# Patient Record
Sex: Female | Born: 1990 | Race: White | Hispanic: No | Marital: Single | Smoking: Current every day smoker
Health system: Southern US, Community
[De-identification: ages and names within clinical notes are randomized; demographics above are authoritative.]

## PROBLEM LIST (undated history)

## (undated) ENCOUNTER — Inpatient Hospital Stay (HOSPITAL_COMMUNITY): Payer: Self-pay

## (undated) DIAGNOSIS — F329 Major depressive disorder, single episode, unspecified: Secondary | ICD-10-CM

## (undated) DIAGNOSIS — N611 Abscess of the breast and nipple: Secondary | ICD-10-CM

## (undated) DIAGNOSIS — J45909 Unspecified asthma, uncomplicated: Secondary | ICD-10-CM

## (undated) DIAGNOSIS — F32A Depression, unspecified: Secondary | ICD-10-CM

## (undated) DIAGNOSIS — S5292XA Unspecified fracture of left forearm, initial encounter for closed fracture: Secondary | ICD-10-CM

## (undated) DIAGNOSIS — S92302A Fracture of unspecified metatarsal bone(s), left foot, initial encounter for closed fracture: Secondary | ICD-10-CM

---
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
## 2009-04-20 ENCOUNTER — Emergency Department (HOSPITAL_COMMUNITY): Admission: EM | Admit: 2009-04-20 | Discharge: 2009-04-20 | Payer: Self-pay | Admitting: Emergency Medicine

## 2009-07-07 ENCOUNTER — Emergency Department (HOSPITAL_COMMUNITY): Admission: EM | Admit: 2009-07-07 | Discharge: 2009-07-07 | Payer: Self-pay | Admitting: Emergency Medicine

## 2009-10-06 ENCOUNTER — Emergency Department (HOSPITAL_COMMUNITY): Admission: EM | Admit: 2009-10-06 | Discharge: 2009-10-06 | Payer: Self-pay | Admitting: Emergency Medicine

## 2009-11-01 ENCOUNTER — Emergency Department (HOSPITAL_COMMUNITY): Admission: EM | Admit: 2009-11-01 | Discharge: 2009-11-01 | Payer: Self-pay | Admitting: Emergency Medicine

## 2010-04-10 ENCOUNTER — Inpatient Hospital Stay (HOSPITAL_COMMUNITY)
Admission: AD | Admit: 2010-04-10 | Discharge: 2010-04-10 | Disposition: A | Discharge status: Home / Self Care | Payer: Self-pay | Source: Ambulatory Visit | Attending: Obstetrics & Gynecology | Admitting: Obstetrics & Gynecology

## 2010-04-10 DIAGNOSIS — R109 Unspecified abdominal pain: Secondary | ICD-10-CM | POA: Insufficient documentation

## 2010-04-10 DIAGNOSIS — N949 Unspecified condition associated with female genital organs and menstrual cycle: Secondary | ICD-10-CM | POA: Insufficient documentation

## 2010-04-10 DIAGNOSIS — N938 Other specified abnormal uterine and vaginal bleeding: Secondary | ICD-10-CM | POA: Insufficient documentation

## 2010-04-10 LAB — URINALYSIS, ROUTINE W REFLEX MICROSCOPIC
Bilirubin Urine: NEGATIVE
Glucose, UA: NEGATIVE mg/dL
Ketones, ur: NEGATIVE mg/dL
Nitrite: NEGATIVE
Protein, ur: NEGATIVE mg/dL
Specific Gravity, Urine: 1.025 (ref 1.005–1.030)
Urobilinogen, UA: 0.2 mg/dL (ref 0.0–1.0)
pH: 6.5 (ref 5.0–8.0)

## 2010-04-10 LAB — DIFFERENTIAL
Basophils Relative: 0 % (ref 0–1)
Lymphs Abs: 2.8 10*3/uL (ref 0.7–4.0)
Monocytes Relative: 9 % (ref 3–12)
Neutro Abs: 4 10*3/uL (ref 1.7–7.7)

## 2010-04-10 LAB — CBC
Hemoglobin: 14.2 g/dL (ref 12.0–15.0)
Platelets: 271 10*3/uL (ref 150–400)
RDW: 13.1 % (ref 11.5–15.5)

## 2010-04-10 LAB — HCG, QUANTITATIVE, PREGNANCY: hCG, Beta Chain, Quant, S: <2 m[IU]/mL (ref <5)

## 2010-04-10 LAB — WET PREP, GENITAL
Clue Cells Wet Prep HPF POC: NONE SEEN
Trich, Wet Prep: NONE SEEN

## 2010-04-11 LAB — GC/CHLAMYDIA PROBE AMP, GENITAL: Chlamydia, DNA Probe: NEGATIVE

## 2010-05-20 ENCOUNTER — Emergency Department (HOSPITAL_COMMUNITY)
Admission: EM | Admit: 2010-05-20 | Discharge: 2010-05-20 | Discharge status: Against Medical Advice | Payer: Self-pay | Attending: Emergency Medicine | Admitting: Emergency Medicine

## 2010-05-20 DIAGNOSIS — G43909 Migraine, unspecified, not intractable, without status migrainosus: Secondary | ICD-10-CM | POA: Insufficient documentation

## 2010-09-08 ENCOUNTER — Emergency Department (HOSPITAL_COMMUNITY)
Admission: EM | Admit: 2010-09-08 | Discharge: 2010-09-08 | Discharge status: Against Medical Advice | Payer: Self-pay | Attending: Emergency Medicine | Admitting: Emergency Medicine

## 2010-09-08 DIAGNOSIS — G43909 Migraine, unspecified, not intractable, without status migrainosus: Secondary | ICD-10-CM | POA: Insufficient documentation

## 2010-09-20 ENCOUNTER — Emergency Department (HOSPITAL_COMMUNITY)
Admission: EM | Admit: 2010-09-20 | Discharge: 2010-09-20 | Disposition: A | Discharge status: Home / Self Care | Payer: Self-pay | Attending: Emergency Medicine | Admitting: Emergency Medicine

## 2010-09-20 DIAGNOSIS — J3489 Other specified disorders of nose and nasal sinuses: Secondary | ICD-10-CM | POA: Insufficient documentation

## 2010-09-20 DIAGNOSIS — R059 Cough, unspecified: Secondary | ICD-10-CM | POA: Insufficient documentation

## 2010-09-20 DIAGNOSIS — R5383 Other fatigue: Secondary | ICD-10-CM | POA: Insufficient documentation

## 2010-09-20 DIAGNOSIS — R070 Pain in throat: Secondary | ICD-10-CM | POA: Insufficient documentation

## 2010-09-20 DIAGNOSIS — R509 Fever, unspecified: Secondary | ICD-10-CM | POA: Insufficient documentation

## 2010-09-20 DIAGNOSIS — J069 Acute upper respiratory infection, unspecified: Secondary | ICD-10-CM | POA: Insufficient documentation

## 2010-09-20 DIAGNOSIS — R05 Cough: Secondary | ICD-10-CM | POA: Insufficient documentation

## 2010-09-20 DIAGNOSIS — R5381 Other malaise: Secondary | ICD-10-CM | POA: Insufficient documentation

## 2010-09-20 DIAGNOSIS — F3130 Bipolar disorder, current episode depressed, mild or moderate severity, unspecified: Secondary | ICD-10-CM | POA: Insufficient documentation

## 2010-09-20 DIAGNOSIS — F988 Other specified behavioral and emotional disorders with onset usually occurring in childhood and adolescence: Secondary | ICD-10-CM | POA: Insufficient documentation

## 2010-10-04 LAB — HCG, QUANTITATIVE, PREGNANCY: hCG, Beta Chain, Quant, S: 18 — ABNORMAL HIGH

## 2010-10-29 LAB — CULTURE, ROUTINE-ABSCESS

## 2011-01-15 DIAGNOSIS — S5292XA Unspecified fracture of left forearm, initial encounter for closed fracture: Secondary | ICD-10-CM

## 2011-01-15 HISTORY — DX: Unspecified fracture of left forearm, initial encounter for closed fracture: S52.92XA

## 2011-06-19 ENCOUNTER — Encounter (HOSPITAL_COMMUNITY): Payer: Self-pay | Admitting: Emergency Medicine

## 2011-06-19 ENCOUNTER — Emergency Department (HOSPITAL_COMMUNITY)
Admission: EM | Admit: 2011-06-19 | Discharge: 2011-06-19 | Disposition: A | Discharge status: Home / Self Care | Payer: Self-pay | Attending: Emergency Medicine | Admitting: Emergency Medicine

## 2011-06-19 DIAGNOSIS — F17200 Nicotine dependence, unspecified, uncomplicated: Secondary | ICD-10-CM | POA: Insufficient documentation

## 2011-06-19 DIAGNOSIS — R1013 Epigastric pain: Secondary | ICD-10-CM | POA: Insufficient documentation

## 2011-06-19 DIAGNOSIS — G43909 Migraine, unspecified, not intractable, without status migrainosus: Secondary | ICD-10-CM

## 2011-06-19 DIAGNOSIS — R110 Nausea: Secondary | ICD-10-CM | POA: Insufficient documentation

## 2011-06-19 DIAGNOSIS — R51 Headache: Secondary | ICD-10-CM | POA: Insufficient documentation

## 2011-06-19 LAB — POCT I-STAT, CHEM 8
Calcium, Ion: 1.19 mmol/L (ref 1.12–1.32)
Chloride: 106 mEq/L (ref 96–112)
Creatinine, Ser: 0.8 mg/dL (ref 0.50–1.10)
Glucose, Bld: 120 mg/dL — ABNORMAL HIGH (ref 70–99)
HCT: 47 % — ABNORMAL HIGH (ref 36.0–46.0)
Potassium: 3.5 mEq/L (ref 3.5–5.1)
Sodium: 143 mEq/L (ref 135–145)
TCO2: 24 mmol/L (ref 0–100)

## 2011-06-19 LAB — DIFFERENTIAL
Lymphocytes Relative: 16 % (ref 12–46)
Lymphs Abs: 2.1 10*3/uL (ref 0.7–4.0)
Monocytes Relative: 8 % (ref 3–12)
Neutro Abs: 9.3 10*3/uL — ABNORMAL HIGH (ref 1.7–7.7)
Neutrophils Relative %: 73 % (ref 43–77)

## 2011-06-19 LAB — CBC
Hemoglobin: 15.1 g/dL — ABNORMAL HIGH (ref 12.0–15.0)
MCH: 31.9 pg (ref 26.0–34.0)
MCHC: 34.3 g/dL (ref 30.0–36.0)
RBC: 4.73 MIL/uL (ref 3.87–5.11)

## 2011-06-19 LAB — URINALYSIS, ROUTINE W REFLEX MICROSCOPIC
Nitrite: NEGATIVE
Protein, ur: NEGATIVE mg/dL
Urobilinogen, UA: 0.2 mg/dL (ref 0.0–1.0)

## 2011-06-19 LAB — POCT PREGNANCY, URINE: Preg Test, Ur: NEGATIVE

## 2011-06-19 LAB — URINE MICROSCOPIC-ADD ON

## 2011-06-19 MED ORDER — ONDANSETRON 4 MG PO TBDP
4.0000 mg | ORAL_TABLET | Freq: Once | ORAL | Status: DC
  Filled 2011-06-19: qty 1

## 2011-06-19 NOTE — ED Notes (Signed)
Pt. Not currently in room or bathroom. Will recheck.

## 2011-06-19 NOTE — ED Notes (Signed)
Patient added also having headaches intermittently 0/10.

## 2011-06-19 NOTE — ED Notes (Signed)
Pt. Reports nausea and migraine X 1 week. States hx of migraines when she was younger.  States the migraines come and go. Denies vomiting. Mid epigastric abdominal pain, tender to palpitation.

## 2011-06-19 NOTE — ED Notes (Signed)
Pt. Not in room, bathroom, or in ED waiting room.

## 2011-06-19 NOTE — ED Notes (Signed)
Onset one week ago nausea, acid reflux, intermittent shaking, and light headed. Concerned if possible pregnancy or virus.  Epigastric pain cramping 4-5/10.

## 2011-06-19 NOTE — ED Provider Notes (Signed)
History   This chart was scribed for Celene Kras, MD by Melba Coon. The patient was seen in room STRE3/STRE3 and the patient's care was started at 5:08PM.    CSN: [id number]  Arrival date & time 06/19/11  1448   None     Chief Complaint  Patient presents with  . Nausea  . Headache    (Consider location/radiation/quality/duration/timing/severity/associated sxs/prior treatment) HPI Mary Davies is a 21 y.o. female who presents to the Emergency Department complaining of intermtitent, moderate to severe headache with associated nausea with an onset 1:00PM.  Pain started a week ago but got progressively worse today. Pt states that she has eben feeling hot, intermittent shaking, and dizzy. Pt also states she has acid reflux and nausea while eating. No fever, neck pain, sore throat, rash, back pain, CP, SOB, abd pain, vomit, diarrhea, dysuria, or extremity pain, edema, weakness, numbness, or tingling. No known allergies. No other pertinent medical symptoms.  History reviewed. No pertinent past medical history.  History reviewed. No pertinent past surgical history.  No family history on file.  History  Substance Use Topics  . Smoking status: Current Everyday Smoker  . Smokeless tobacco: Not on file  . Alcohol Use: No    OB History    Grav Para Term Preterm Abortions TAB SAB Ect Mult Living                  Review of Systems 10 Systems reviewed and all are negative for acute change except as noted in the HPI.   Allergies  Review of patient's allergies indicates no known allergies.  Home Medications   Current Outpatient Rx  Name Route Sig Dispense Refill  . ACETAMINOPHEN 500 MG PO TABS Oral Take 1,000 mg by mouth every 6 (six) hours as needed. For headache      BP 130/80  Pulse 78  Temp(Src) 98.1 F (36.7 C) (Oral)  Resp 16  SpO2 97%  Physical Exam  Nursing note and vitals reviewed. Constitutional: She appears well-developed and well-nourished. No  distress.  HENT:  Head: Normocephalic and atraumatic.  Right Ear: External ear normal.  Left Ear: External ear normal.  Eyes: Conjunctivae are normal. Right eye exhibits no discharge. Left eye exhibits no discharge. No scleral icterus.  Neck: Neck supple. No tracheal deviation present.  Cardiovascular: Normal rate, regular rhythm and intact distal pulses.   Pulmonary/Chest: Effort normal and breath sounds normal. No stridor. No respiratory distress. She has no wheezes. She has no rales.  Abdominal: Soft. Bowel sounds are normal. She exhibits no distension. There is tenderness (mild epigastric). There is no rebound and no guarding.  Musculoskeletal: She exhibits no edema and no tenderness.  Neurological: She is alert. She has normal strength. No sensory deficit. Cranial nerve deficit:  no gross defecits noted. She exhibits normal muscle tone. She displays no seizure activity. Coordination normal.  Skin: Skin is warm and dry. No rash noted.  Psychiatric: She has a normal mood and affect.    ED Course  Procedures (including critical care time)  DIAGNOSTIC STUDIES: Oxygen Saturation is 97% on room air, normal by my interpretation.    COORDINATION OF CARE:  5:10PM - lab results are c/w UTI; more blood tests for gall bladder function and zofran will be ordered for the pt   Labs Reviewed  CBC - Abnormal; Notable for the following:    WBC 12.8 (*)    Hemoglobin 15.1 (*)    All other components within normal limits  DIFFERENTIAL - Abnormal; Notable for the following:    Neutro Abs 9.3 (*)    All other components within normal limits  URINALYSIS, ROUTINE W REFLEX MICROSCOPIC - Abnormal; Notable for the following:    APPearance CLOUDY (*)    Bilirubin Urine SMALL (*)    Leukocytes, UA MODERATE (*)    All other components within normal limits  POCT I-STAT, CHEM 8 - Abnormal; Notable for the following:    Glucose, Bld 120 (*)    Hemoglobin 16.0 (*)    HCT 47.0 (*)    All other  components within normal limits  URINE MICROSCOPIC-ADD ON - Abnormal; Notable for the following:    Squamous Epithelial / LPF MANY (*)    All other components within normal limits  POCT PREGNANCY, URINE  LAB REPORT - SCANNED   No results found.   No diagnosis found.    MDM  Pt treated for probable migraine headache.  Felt better and was going to be released but left prior to receiving instructions.  I personally performed the services described in this documentation, which was scribed in my presence.  The recorded information has been reviewed and considered.        Celene Kras, MD 06/21/11 1331

## 2011-08-26 ENCOUNTER — Emergency Department (HOSPITAL_COMMUNITY)
Admission: EM | Admit: 2011-08-26 | Discharge: 2011-08-26 | Disposition: A | Discharge status: Home / Self Care | Payer: Self-pay | Attending: Emergency Medicine | Admitting: Emergency Medicine

## 2011-08-26 ENCOUNTER — Emergency Department (HOSPITAL_COMMUNITY): Payer: Self-pay

## 2011-08-26 ENCOUNTER — Encounter (HOSPITAL_COMMUNITY): Payer: Self-pay | Admitting: *Deleted

## 2011-08-26 DIAGNOSIS — L0390 Cellulitis, unspecified: Secondary | ICD-10-CM

## 2011-08-26 DIAGNOSIS — F17200 Nicotine dependence, unspecified, uncomplicated: Secondary | ICD-10-CM | POA: Insufficient documentation

## 2011-08-26 DIAGNOSIS — L02519 Cutaneous abscess of unspecified hand: Secondary | ICD-10-CM | POA: Insufficient documentation

## 2011-08-26 DIAGNOSIS — L03019 Cellulitis of unspecified finger: Secondary | ICD-10-CM | POA: Insufficient documentation

## 2011-08-26 MED ORDER — CLINDAMYCIN HCL 150 MG PO CAPS: ORAL_CAPSULE | ORAL | Status: AC

## 2011-08-26 MED ORDER — CLINDAMYCIN HCL 300 MG PO CAPS
300.0000 mg | ORAL_CAPSULE | Freq: Once | ORAL | Status: AC
  Administered 2011-08-26: 300 mg via ORAL
  Filled 2011-08-26: qty 1

## 2011-08-26 NOTE — ED Notes (Signed)
PT ambulated with a steady gait; VSS; A&Ox3; no signs of distress; respirations even and unlabored; skin warm and dry. No questions.

## 2011-08-26 NOTE — ED Notes (Signed)
Pt states she woke up this morning with her left middle finger swollen and bruised. Pt denies injury or known reason.

## 2011-08-26 NOTE — ED Provider Notes (Signed)
Medical screening examination/treatment/procedure(s) were performed by non-physician practitioner and as supervising physician I was immediately available for consultation/collaboration.  Sharrieff Spratlin M Bergen Melle, MD 08/26/11 0643 

## 2011-08-26 NOTE — ED Notes (Signed)
Patient transported to X-ray 

## 2011-08-26 NOTE — ED Provider Notes (Signed)
History     CSN: [id number]  Arrival date & time 08/26/11  0457   First MD Initiated Contact with Patient 08/26/11 0507      Chief Complaint  Patient presents with  . Finger Injury    (Consider location/radiation/quality/duration/timing/severity/associated sxs/prior treatment) HPI Comments: Patient is a current everyday smoker that presents emergency department with chief complaint of left middle finger swelling.  Onset of symptoms began this morning and are associated with swelling and warmth of finger. Associated symptom is mild pain rated at 3/10 in severity that does not radiate. Pt denies any recent trauma or injury, fever, night sweats, chills, erythematous streaks up arm or purulent drainage.  The history is provided by the patient.    History reviewed. No pertinent past medical history.  History reviewed. No pertinent past surgical history.  History reviewed. No pertinent family history.  History  Substance Use Topics  . Smoking status: Current Everyday Smoker  . Smokeless tobacco: Not on file  . Alcohol Use: No    OB History    Grav Para Term Preterm Abortions TAB SAB Ect Mult Living                  Review of Systems  All other systems reviewed and are negative.    Allergies  Review of patient's allergies indicates no known allergies.  Home Medications  No current outpatient prescriptions on file.  BP 142/90  Pulse 68  Temp 98.3 F (36.8 C) (Oral)  Resp 16  SpO2 99%  LMP 07/24/2011  Physical Exam  Nursing note and vitals reviewed. Constitutional: She is oriented to person, place, and time. She appears well-developed and well-nourished. No distress.  HENT:  Head: Normocephalic and atraumatic.  Eyes: Conjunctivae and EOM are normal.  Neck: Normal range of motion.  Pulmonary/Chest: Effort normal.  Musculoskeletal: Normal range of motion.  Neurological: She is alert and oriented to person, place, and time.  Skin: Skin is warm and dry. No rash  noted. She is not diaphoretic.       tiny abrasion over left 3rd finger, dorsal surface, with surrounding erythema and warmth ~2cm. Mild swelling of digit.   Psychiatric: She has a normal mood and affect. Her behavior is normal.    ED Course  Procedures (including critical care time)   Labs Reviewed  POCT PREGNANCY, URINE   No results found.   No diagnosis found.    MDM  Cellulitis  Pt presentation c/w mils cellulitis that can be treated as an OP w close follow up. Strict return precautions discussed.  At this time there does not appear to be any evidence of an acute emergency medical condition and the patient appears stable for discharge with appropriate outpatient follow up.         Jaci Carrel, [place] 08/26/11 [phone]

## 2011-08-26 NOTE — ED Notes (Signed)
Pt's middle finger has bite on it and mild swelling. PT reports she thinks it was a spider.

## 2012-02-07 ENCOUNTER — Emergency Department (HOSPITAL_COMMUNITY)
Admission: EM | Admit: 2012-02-07 | Discharge: 2012-02-07 | Disposition: A | Discharge status: Home / Self Care | Payer: Self-pay | Attending: Emergency Medicine | Admitting: Emergency Medicine

## 2012-02-07 ENCOUNTER — Encounter (HOSPITAL_COMMUNITY): Payer: Self-pay | Admitting: Emergency Medicine

## 2012-02-07 DIAGNOSIS — B86 Scabies: Secondary | ICD-10-CM | POA: Insufficient documentation

## 2012-02-07 DIAGNOSIS — F17200 Nicotine dependence, unspecified, uncomplicated: Secondary | ICD-10-CM | POA: Insufficient documentation

## 2012-02-07 MED ORDER — PERMETHRIN 5 % EX CREA: TOPICAL_CREAM | CUTANEOUS | Status: DC

## 2012-02-07 NOTE — ED Notes (Signed)
Rash on body , has had it for 4 days. Appears to be scabies. Has h/o of scabies

## 2012-02-07 NOTE — ED Provider Notes (Signed)
History     CSN: [id number]  Arrival date & time 02/07/12  1045   First MD Initiated Contact with Patient 02/07/12 1102      Chief Complaint  Patient presents with  . Rash    (Consider location/radiation/quality/duration/timing/severity/associated sxs/prior treatment) Patient is a 22 y.o. female presenting with rash. The history is provided by the patient. No language interpreter was used.  Rash  This is a new problem. The problem has been gradually worsening. The problem is associated with nothing. There has been no fever. The rash is present on the abdomen, back and trunk. The pain is at a severity of 5/10. The pain is moderate. The pain has been worsening since onset. Associated symptoms include itching. She has tried nothing for the symptoms.   Pt complains of a rash.  Pt reports feels like scabies she has had in the past Past Medical History  Diagnosis Date  . Scabies     History reviewed. No pertinent past surgical history.  No family history on file.  History  Substance Use Topics  . Smoking status: Current Every Day Smoker  . Smokeless tobacco: Not on file  . Alcohol Use: No    OB History    Grav Para Term Preterm Abortions TAB SAB Ect Mult Living                  Review of Systems  Skin: Positive for itching and rash.  All other systems reviewed and are negative.    Allergies  Review of patient's allergies indicates no known allergies.  Home Medications  No current outpatient prescriptions on file.  BP 136/84  Pulse 75  Temp 97.4 F (36.3 C) (Oral)  Resp 16  SpO2 98%  LMP 01/14/2012  Physical Exam  Nursing note and vitals reviewed. Constitutional: She is oriented to person, place, and time. She appears well-developed and well-nourished.  HENT:  Head: Normocephalic.  Right Ear: External ear normal.  Left Ear: External ear normal.  Nose: Nose normal.  Mouth/Throat: Oropharynx is clear and moist.  Eyes: Conjunctivae normal are normal.  Pupils are equal, round, and reactive to light.  Neck: Normal range of motion. Neck supple.  Cardiovascular: Normal rate and normal heart sounds.   Pulmonary/Chest: Effort normal.  Musculoskeletal: Normal range of motion.  Neurological: She is alert and oriented to person, place, and time. She has normal reflexes.  Skin: Skin is warm.  Psychiatric: She has a normal mood and affect.    ED Course  Procedures (including critical care time)  Labs Reviewed - No data to display No results found.   No diagnosis found.    MDM    elemite     Lonia Skinner [place], [place] 02/07/12 [phone]

## 2012-02-07 NOTE — ED Provider Notes (Signed)
Medical screening examination/treatment/procedure(s) were performed by non-physician practitioner and as supervising physician I was immediately available for consultation/collaboration.   Gilda Crease, MD 02/07/12 [phone]

## 2012-07-03 ENCOUNTER — Encounter (HOSPITAL_COMMUNITY): Payer: Self-pay | Admitting: Emergency Medicine

## 2012-07-03 ENCOUNTER — Emergency Department (HOSPITAL_COMMUNITY)
Admission: EM | Admit: 2012-07-03 | Discharge: 2012-07-03 | Disposition: A | Discharge status: Home / Self Care | Payer: Self-pay | Attending: Emergency Medicine | Admitting: Emergency Medicine

## 2012-07-03 DIAGNOSIS — T24211A Burn of second degree of right thigh, initial encounter: Secondary | ICD-10-CM

## 2012-07-03 DIAGNOSIS — T2124XA Burn of second degree of lower back, initial encounter: Secondary | ICD-10-CM | POA: Insufficient documentation

## 2012-07-03 DIAGNOSIS — Z8619 Personal history of other infectious and parasitic diseases: Secondary | ICD-10-CM | POA: Insufficient documentation

## 2012-07-03 DIAGNOSIS — Y9289 Other specified places as the place of occurrence of the external cause: Secondary | ICD-10-CM | POA: Insufficient documentation

## 2012-07-03 DIAGNOSIS — Y9389 Activity, other specified: Secondary | ICD-10-CM | POA: Insufficient documentation

## 2012-07-03 DIAGNOSIS — Z23 Encounter for immunization: Secondary | ICD-10-CM | POA: Insufficient documentation

## 2012-07-03 DIAGNOSIS — X19XXXA Contact with other heat and hot substances, initial encounter: Secondary | ICD-10-CM | POA: Insufficient documentation

## 2012-07-03 DIAGNOSIS — F17200 Nicotine dependence, unspecified, uncomplicated: Secondary | ICD-10-CM | POA: Insufficient documentation

## 2012-07-03 DIAGNOSIS — T24219A Burn of second degree of unspecified thigh, initial encounter: Secondary | ICD-10-CM | POA: Insufficient documentation

## 2012-07-03 MED ORDER — TETANUS-DIPHTH-ACELL PERTUSSIS 5-2.5-18.5 LF-MCG/0.5 IM SUSP
0.5000 mL | Freq: Once | INTRAMUSCULAR | Status: AC
  Administered 2012-07-03: 0.5 mL via INTRAMUSCULAR
  Filled 2012-07-03: qty 0.5

## 2012-07-03 NOTE — ED Notes (Signed)
Pharmacy tech at bedside 

## 2012-07-03 NOTE — ED Provider Notes (Signed)
History  This chart was scribed for Mary Cooper III, MD by Ardelia Mems, ED Scribe. This patient was seen in room TR08C/TR08C and the patient's care was started at 3:09 PM.   CSN: [id number]  Arrival date & time 07/03/12  1423      Chief Complaint  Patient presents with  . Burn     The history is provided by the patient. No language interpreter was used.    HPI Comments: Mary Davies is a 22 y.o. female who presents to the Emergency Department complaining of a burn to her right buttock onset about a week ago after accidentally sitting on her hair straightener. Pt is here today under suspicion that her wound is not healing appropriately. Pt states that there is associated constant, moderate pain to the area that is worsened with palpation or any movement of the leg. Pt states that she has tried cleaning the burn with alcohol and neosporin without relief of pain. Pt is here solely for the burn without any associated symptoms. Pt denies nausea, vomiting, fever, or any other symptoms  PCP- None  Past Medical History  Diagnosis Date  . Scabies     History reviewed. No pertinent past surgical history.  No family history on file.  History  Substance Use Topics  . Smoking status: Current Every Day Smoker  . Smokeless tobacco: Not on file  . Alcohol Use: No    OB History   Grav Para Term Preterm Abortions TAB SAB Ect Mult Living                  Review of Systems  Constitutional: Negative for fever and diaphoresis.  HENT: Negative for neck pain and neck stiffness.   Eyes: Negative for visual disturbance.  Respiratory: Negative for apnea, chest tightness and shortness of breath.   Cardiovascular: Negative for chest pain and palpitations.  Gastrointestinal: Negative for nausea, vomiting, diarrhea and constipation.  Genitourinary: Negative for dysuria.  Musculoskeletal: Negative for gait problem.  Skin: Positive for wound (Healing burn, right buttocks). Negative for  rash.  Neurological: Negative for dizziness, weakness, light-headedness, numbness and headaches.   A complete 10 system review of systems was obtained and all systems are negative except as noted in the HPI and PMH.   Allergies  Pineapple  Home Medications  No current outpatient prescriptions on file.  Triage Vitals: BP 136/81  Pulse 77  Temp(Src) 98.6 F (37 C) (Oral)  Resp 18  SpO2 99%  LMP 06/19/2012  Physical Exam  Nursing note and vitals reviewed. Constitutional: She is oriented to person, place, and time. She appears well-developed and well-nourished. No distress.  HENT:  Head: Normocephalic and atraumatic.  Eyes: Conjunctivae and EOM are normal.  Neck: Normal range of motion. Neck supple.  Cardiovascular: Normal rate, regular rhythm, normal heart sounds and intact distal pulses.   Pulmonary/Chest: Effort normal and breath sounds normal. No respiratory distress.  Abdominal: Soft. There is no tenderness.  Musculoskeletal: Normal range of motion. She exhibits no edema and no tenderness.  Neurological: She is alert and oriented to person, place, and time. No cranial nerve deficit.  Skin: She is not diaphoretic.  Healing 2nd degree burns 1% BSA noted to right buttocks.Erythema. No swelling. No blistering. Non blanching. Scabbing. Damage does not extend beyond the dermis.  6 cm long, triangular shaped burn- 3 cm wide at the top to 1 cm wide at the bottom.  Some redness at edges, no streaking, no blisters Advanced stage of healing  Psychiatric: She has a normal mood and affect.    ED Course  Procedures (including critical care time)  DIAGNOSTIC STUDIES: Oxygen Saturation is 99% on RA, normal by my interpretation.    COORDINATION OF CARE: 3:47 PM- Pt advised of plan for treatment and pt agrees.  3:50 PM- Pt informed that her burn is healing appropriately, and advised on how to manage the wound appropriately for proper, continued healing with bacitracin, gauze,  etc.   Medications  TDaP (BOOSTRIX) injection 0.5 mL (0.5 mLs Intramuscular Given 07/03/12 1507)      Labs Reviewed - No data to display No results found.   1. Second degree burn of right thigh, initial encounter       MDM  .Burn is over a week old. Pt has been keeping it clean. Concerned because it continues to cause her pain. On PE, burn is healing well. No signs of infection. No warmth, no pus, no streaks. Pt able to ambulate well.  At this time there does not appear to be any evidence of an acute emergency medical condition and the patient appears stable for discharge with supportive care appropriate outpatient follow up.Provide resource guide for primary care. Diagnosis was discussed with patient who verbalizes understanding and is agreeable to discharge.  I personally performed the services described in this documentation, which was scribed in my presence. The recorded information has been reviewed and is accurate.    Glade Nurse, PA-C 07/03/12 1828

## 2012-07-03 NOTE — ED Notes (Signed)
Pt. Stated, i sat on my hair straightner a week ago and i feel like the burn has gotten woprse.

## 2012-07-03 NOTE — ED Notes (Addendum)
Pt sat on a hot hair iron 1 week ago. Has 4 in x 1in burn on right posterior upper thigh.

## 2012-07-04 NOTE — ED Provider Notes (Signed)
Medical screening examination/treatment/procedure(s) were performed by non-physician practitioner and as supervising physician I was immediately available for consultation/collaboration.   Carleene Cooper III, MD 07/04/12 [phone]

## 2012-07-10 ENCOUNTER — Encounter (HOSPITAL_COMMUNITY): Payer: Self-pay

## 2012-07-10 ENCOUNTER — Emergency Department (HOSPITAL_COMMUNITY)
Admission: EM | Admit: 2012-07-10 | Discharge: 2012-07-10 | Disposition: A | Discharge status: Home / Self Care | Payer: Self-pay | Attending: Emergency Medicine | Admitting: Emergency Medicine

## 2012-07-10 DIAGNOSIS — Y929 Unspecified place or not applicable: Secondary | ICD-10-CM | POA: Insufficient documentation

## 2012-07-10 DIAGNOSIS — Y939 Activity, unspecified: Secondary | ICD-10-CM | POA: Insufficient documentation

## 2012-07-10 DIAGNOSIS — L03119 Cellulitis of unspecified part of limb: Secondary | ICD-10-CM | POA: Insufficient documentation

## 2012-07-10 DIAGNOSIS — F17200 Nicotine dependence, unspecified, uncomplicated: Secondary | ICD-10-CM | POA: Insufficient documentation

## 2012-07-10 DIAGNOSIS — R21 Rash and other nonspecific skin eruption: Secondary | ICD-10-CM | POA: Insufficient documentation

## 2012-07-10 DIAGNOSIS — Z8619 Personal history of other infectious and parasitic diseases: Secondary | ICD-10-CM | POA: Insufficient documentation

## 2012-07-10 DIAGNOSIS — L02419 Cutaneous abscess of limb, unspecified: Secondary | ICD-10-CM | POA: Insufficient documentation

## 2012-07-10 DIAGNOSIS — L03116 Cellulitis of left lower limb: Secondary | ICD-10-CM

## 2012-07-10 MED ORDER — CEPHALEXIN 500 MG PO CAPS: 500.0000 mg | ORAL_CAPSULE | Freq: Four times a day (QID) | ORAL | Status: DC

## 2012-07-10 MED ORDER — HYDROCODONE-ACETAMINOPHEN 5-325 MG PO TABS: 1.0000 | ORAL_TABLET | ORAL | Status: DC | PRN

## 2012-07-10 NOTE — ED Provider Notes (Signed)
   History    This chart was scribed for non-physician practitioner, Fayrene Helper PA-C, working with Gwyneth Sprout, MD by Donne Anon, ED Scribe. This patient was seen in room TR04C/TR04C and the patient's care was started at 1508.  CSN: [id number] Arrival date & time 07/10/12  1418  First MD Initiated Contact with Patient 07/10/12 1508     Chief Complaint  Patient presents with  . Insect Bite    The history is provided by the patient. No language interpreter was used.  HPI Comments: Mary Davies is a 22 y.o. female who presents to the Emergency Department complaining of an insect bite to her left thigh and left calf that occurred 3 days ago. She reports she scratched the bites, and the bite on her thigh became significantly more red and warm. She denies itching. She states she did not see an ant bite her, but she had stepped in an ant hill earlier in the day and had several amps crawling on her. She denies any other pain at this time.  Past Medical History  Diagnosis Date  . Scabies    History reviewed. No pertinent past surgical history. History reviewed. No pertinent family history. History  Substance Use Topics  . Smoking status: Current Every Day Smoker  . Smokeless tobacco: Not on file  . Alcohol Use: No   OB History   Grav Para Term Preterm Abortions TAB SAB Ect Mult Living                 Review of Systems  Skin: Positive for rash.  All other systems reviewed and are negative.    Allergies  Pineapple  Home Medications  No current outpatient prescriptions on file. BP 138/90  Pulse 78  Temp(Src) 97.8 F (36.6 C) (Oral)  Resp 18  SpO2 95%  LMP 06/17/2012 Physical Exam  Nursing note and vitals reviewed. Constitutional: She appears well-developed and well-nourished. No distress.  HENT:  Head: Normocephalic and atraumatic.  Eyes: Conjunctivae are normal.  Neck: Neck supple. No tracheal deviation present.  Cardiovascular: Normal rate.    Pulmonary/Chest: Effort normal. No respiratory distress.  Musculoskeletal: Normal range of motion.  Neurological: She is alert.  Skin: Skin is warm and dry.  Left upper thigh with a cellulitic area measuring 3x5 inches with an accentuated puncture and small scab. Mild induration without significant fluctuance. Tender to palpation.  Psychiatric: She has a normal mood and affect. Her behavior is normal.    ED Course  Procedures (including critical care time) DIAGNOSTIC STUDIES: Oxygen Saturation is 95% on RA, adequate by my interpretation.    COORDINATION OF CARE: 3:49 PM Discussed treatment plan which includes antibiotics, pain medication, and warm compreses with pt at bedside and pt agreed to plan. I will also outline the area and obtain a picture. Return precautions advised.     Labs Reviewed - No data to display No results found. 1. Cellulitis of left thigh     MDM  BP 138/90  Pulse 78  Temp(Src) 97.8 F (36.6 C) (Oral)  Resp 18  SpO2 95%  LMP 06/17/2012   I personally performed the services described in this documentation, which was scribed in my presence. The recorded information has been reviewed and is accurate.      Fayrene Helper, PA-C 07/10/12 1554

## 2012-07-10 NOTE — ED Notes (Signed)
Pt presents with 3 day h/o insect bites to L leg.  Pt reports she noted "a lot of ants" on her leg, but unsure if they bit her.  Pt reports areas are red with white middle, denies any drainage to areas.

## 2012-07-10 NOTE — ED Notes (Signed)
Pt c/o left sided upper leg pain, sts she thinks she was bitten by an ant 3 days ago and it just keeps getting worse. Pt has redness to left upper thigh, with center spot that has a white color to it. Reports redness has grown over the past couple of days. Tender to touch. Pt in nad, skin warm and dry, resp e/u, ambulatory to room.

## 2012-07-11 ENCOUNTER — Emergency Department (HOSPITAL_COMMUNITY)
Admission: EM | Admit: 2012-07-11 | Discharge: 2012-07-11 | Disposition: A | Discharge status: Home / Self Care | Payer: Self-pay | Attending: Emergency Medicine | Admitting: Emergency Medicine

## 2012-07-11 ENCOUNTER — Encounter (HOSPITAL_COMMUNITY): Payer: Self-pay | Admitting: Emergency Medicine

## 2012-07-11 DIAGNOSIS — R21 Rash and other nonspecific skin eruption: Secondary | ICD-10-CM | POA: Insufficient documentation

## 2012-07-11 DIAGNOSIS — Y939 Activity, unspecified: Secondary | ICD-10-CM | POA: Insufficient documentation

## 2012-07-11 DIAGNOSIS — L03119 Cellulitis of unspecified part of limb: Secondary | ICD-10-CM | POA: Insufficient documentation

## 2012-07-11 DIAGNOSIS — L0390 Cellulitis, unspecified: Secondary | ICD-10-CM

## 2012-07-11 DIAGNOSIS — F17200 Nicotine dependence, unspecified, uncomplicated: Secondary | ICD-10-CM | POA: Insufficient documentation

## 2012-07-11 DIAGNOSIS — L02419 Cutaneous abscess of limb, unspecified: Secondary | ICD-10-CM | POA: Insufficient documentation

## 2012-07-11 DIAGNOSIS — Z872 Personal history of diseases of the skin and subcutaneous tissue: Secondary | ICD-10-CM | POA: Insufficient documentation

## 2012-07-11 DIAGNOSIS — Z79899 Other long term (current) drug therapy: Secondary | ICD-10-CM | POA: Insufficient documentation

## 2012-07-11 DIAGNOSIS — Y929 Unspecified place or not applicable: Secondary | ICD-10-CM | POA: Insufficient documentation

## 2012-07-11 MED ORDER — SULFAMETHOXAZOLE-TRIMETHOPRIM 800-160 MG PO TABS: 1.0000 | ORAL_TABLET | Freq: Two times a day (BID) | ORAL | Status: DC

## 2012-07-11 NOTE — ED Notes (Signed)
Pt states she was here yesterday due to insect bite. Told to follow up for recheck in 2 days. Came in today because she is in pain, and has to work next 2 days. Redness noted outside area marked by skin marker. She has taken 2 doses of her medication.

## 2012-07-11 NOTE — ED Provider Notes (Signed)
Medical screening examination/treatment/procedure(s) were performed by non-physician practitioner and as supervising physician I was immediately available for consultation/collaboration.   Margreat Widener W Vandy Fong, MD 07/11/12 2308 

## 2012-07-11 NOTE — ED Notes (Signed)
Pt was just seen here yesterday for same thing. Pt sts that the redness has spread a little past the line drawn yesterday and wasn't able to get her pain prescription filled because it was too expensive, and the pain isn't getting better. Denies taking any OTC pain meds PTA. sts she has taken her antibiotic this morning. Pt in nad, skin warm and dry, resp e/u, ambulatory to room.

## 2012-07-11 NOTE — ED Notes (Signed)
Went to d/c pt, pt and family member gone, not outside room or in near distant area.

## 2012-07-11 NOTE — ED Provider Notes (Signed)
Medical screening examination/treatment/procedure(s) were performed by non-physician practitioner and as supervising physician I was immediately available for consultation/collaboration.   Gwyneth Sprout, MD 07/11/12 2312

## 2012-07-11 NOTE — ED Provider Notes (Signed)
History    CSN: [id number] Arrival date & time 07/11/12  2017  First MD Initiated Contact with Patient 07/11/12 2053     Chief Complaint  Patient presents with  . Insect Bite   (Consider location/radiation/quality/duration/timing/severity/associated sxs/prior Treatment) HPI  Lasheena Frieze is a 22 y.o. female presenting for cellulitis recheck. Patient was seen yesterday, given prescription for Keflex 4 times a day. Patient has taken only 2 doses because she said that she is busy. When queried as to whether she has read the instructions on how to take the medication she says that she has not. Patient denies fever, nausea vomiting, streaks. She endorses pain, 10 out of 10. She was given a prescription for Vicodin which she did not fill.  Past Medical History  Diagnosis Date  . Scabies    History reviewed. No pertinent past surgical history. No family history on file. History  Substance Use Topics  . Smoking status: Current Every Day Smoker  . Smokeless tobacco: Not on file  . Alcohol Use: No   OB History   Grav Para Term Preterm Abortions TAB SAB Ect Mult Living                 Review of Systems  Constitutional: Negative for fever.  Respiratory: Negative for shortness of breath.   Cardiovascular: Negative for chest pain.  Gastrointestinal: Negative for nausea, vomiting, abdominal pain and diarrhea.  Skin: Positive for rash.  All other systems reviewed and are negative.    Allergies  Pineapple  Home Medications   Current Outpatient Rx  Name  Route  Sig  Dispense  Refill  . cephALEXin (KEFLEX) 500 MG capsule   Oral   Take 500 mg by mouth 4 (four) times daily.          BP 120/76  Pulse 90  Temp(Src) 98.1 F (36.7 C) (Oral)  Resp 18  SpO2 96%  LMP 06/17/2012 Physical Exam  Nursing note and vitals reviewed. Constitutional: She is oriented to person, place, and time. She appears well-developed and well-nourished. No distress.  HENT:  Head:  Normocephalic.  Eyes: Conjunctivae and EOM are normal.  Cardiovascular: Normal rate.   Pulmonary/Chest: Effort normal. No stridor.  Musculoskeletal: Normal range of motion.  Neurological: She is alert and oriented to person, place, and time.  Skin:  4 x 5 area of induration, no pointing or fluctuance. No lymphangitic streaks.  Psychiatric: She has a normal mood and affect.      ED Course  Procedures (including critical care time) Labs Reviewed - No data to display No results found.  1. Cellulitis     MDM   Filed Vitals:   07/11/12 2025  BP: 120/76  Pulse: 90  Temp: 98.1 F (36.7 C)  TempSrc: Oral  Resp: 18  SpO2: 96%     Juley Giovanetti is a 21 y.o. female sent in for cellulitis recheck. Patient has presented early for her recheck because of pain. She has failed to fill her prescription for pain medication. Is taking OTC pain medications either. She is taking her Keflex inappropriately, twice a day instead of 4 times a day. I have discussed with her the importance of taking her medications as prescribed. She has verbalized her understanding. We have discussed return precautions including fever, nausea vomiting, palpitations, rapid breathing. I've asked her to return to the emergency room in 48 hours for recheck. Patient has a history of colonization with MRSA. I will add on bactrim to her keflex.  Pt is hemodynamically stable, appropriate for, and amenable to discharge at this time. Pt verbalized understanding and agrees with care plan. Outpatient follow-up and specific return precautions discussed.    Patient eloped before discharge paperwork and prescriptions could be given.   Wynetta Emery, PA-C 07/11/12 2151  Wynetta Emery, PA-C 07/11/12 2157  Wynetta Emery, PA-C 07/11/12 2159

## 2012-07-13 ENCOUNTER — Emergency Department (HOSPITAL_COMMUNITY): Payer: Self-pay

## 2012-07-13 ENCOUNTER — Encounter (HOSPITAL_COMMUNITY): Payer: Self-pay | Admitting: Emergency Medicine

## 2012-07-13 ENCOUNTER — Inpatient Hospital Stay (HOSPITAL_COMMUNITY)
Admission: EM | Admit: 2012-07-13 | Discharge: 2012-07-14 | DRG: 603 | Disposition: A | Discharge status: Home / Self Care | Payer: Self-pay | Attending: Internal Medicine | Admitting: Internal Medicine

## 2012-07-13 DIAGNOSIS — L0390 Cellulitis, unspecified: Secondary | ICD-10-CM

## 2012-07-13 DIAGNOSIS — L02419 Cutaneous abscess of limb, unspecified: Principal | ICD-10-CM | POA: Diagnosis present

## 2012-07-13 DIAGNOSIS — F17200 Nicotine dependence, unspecified, uncomplicated: Secondary | ICD-10-CM | POA: Diagnosis present

## 2012-07-13 DIAGNOSIS — L03119 Cellulitis of unspecified part of limb: Principal | ICD-10-CM | POA: Diagnosis present

## 2012-07-13 DIAGNOSIS — Z720 Tobacco use: Secondary | ICD-10-CM

## 2012-07-13 LAB — CBC WITH DIFFERENTIAL/PLATELET
Basophils Absolute: 0 10*3/uL (ref 0.0–0.1)
Basophils Relative: 0 % (ref 0–1)
Eosinophils Absolute: 0.4 10*3/uL (ref 0.0–0.7)
MCH: 31.1 pg (ref 26.0–34.0)
MCHC: 33.8 g/dL (ref 30.0–36.0)
Monocytes Relative: 10 % (ref 3–12)
Neutrophils Relative %: 59 % (ref 43–77)
Platelets: 235 10*3/uL (ref 150–400)
RDW: 13.7 % (ref 11.5–15.5)

## 2012-07-13 LAB — BASIC METABOLIC PANEL
BUN: 7 mg/dL (ref 6–23)
GFR calc Af Amer: >90 mL/min (ref >90)
GFR calc non Af Amer: >90 mL/min (ref >90)
Potassium: 3.7 mEq/L (ref 3.5–5.1)

## 2012-07-13 LAB — POCT PREGNANCY, URINE: Preg Test, Ur: NEGATIVE

## 2012-07-13 MED ORDER — SODIUM CHLORIDE 0.9 % IV SOLN
INTRAVENOUS | Status: AC
  Administered 2012-07-13: 23:00:00 via INTRAVENOUS

## 2012-07-13 MED ORDER — VANCOMYCIN HCL IN DEXTROSE 1-5 GM/200ML-% IV SOLN
1000.0000 mg | Freq: Once | INTRAVENOUS | Status: AC
  Administered 2012-07-13: 1000 mg via INTRAVENOUS
  Filled 2012-07-13: qty 200

## 2012-07-13 MED ORDER — SODIUM CHLORIDE 0.9 % IV SOLN
INTRAVENOUS | Status: DC
  Administered 2012-07-13: 19:00:00 via INTRAVENOUS

## 2012-07-13 MED ORDER — IOHEXOL 300 MG/ML  SOLN
80.0000 mL | Freq: Once | INTRAMUSCULAR | Status: AC | PRN
  Administered 2012-07-13: 100 mL via INTRAVENOUS

## 2012-07-13 NOTE — Progress Notes (Signed)
cellulitus to left thigh measuring 12cm in redness x 5cm. Abscess measuring 1cm and wheeping. Burn to right buttock measuring 5cm x2cm.

## 2012-07-13 NOTE — ED Notes (Signed)
MD at bedside. 

## 2012-07-13 NOTE — Progress Notes (Signed)
Paged md to make aware that pt has arrived to floor and to receive admission orders.

## 2012-07-13 NOTE — ED Notes (Signed)
Patient back from CT.

## 2012-07-13 NOTE — ED Notes (Signed)
Patient requesting something to eat & drink. Ok per Dr. Freida Busman. Patient given [place] sandwich bag and sprite

## 2012-07-13 NOTE — ED Provider Notes (Signed)
History    CSN: [id number] Arrival date & time 07/13/12  1148  First MD Initiated Contact with Patient 07/13/12 1804     Chief Complaint  Patient presents with  . Abscess   (Consider location/radiation/quality/duration/timing/severity/associated sxs/prior Treatment) Patient is a 22 y.o. female presenting with abscess. The history is provided by the patient.  Abscess  patient here complaining of worsening swelling and pain to her left anterior thigh that started 3 days ago. Seen here x2 for same and given antibiotics. She had the wound edges marked and they have been increasing. Denies any fever or chills. Notes compliance with her Keflex. No drainage from the wound. She thinks it started from an prior insect bite. No prior history of same. Symptoms have been worsening and nothing makes it better Past Medical History  Diagnosis Date  . Scabies    No past surgical history on file. No family history on file. History  Substance Use Topics  . Smoking status: Current Every Day Smoker  . Smokeless tobacco: Not on file  . Alcohol Use: No   OB History   Grav Para Term Preterm Abortions TAB SAB Ect Mult Living                 Review of Systems  All other systems reviewed and are negative.    Allergies  Pineapple  Home Medications   Current Outpatient Rx  Name  Route  Sig  Dispense  Refill  . cephALEXin (KEFLEX) 500 MG capsule   Oral   Take 500 mg by mouth 4 (four) times daily. For 7 days; Start date 07/10/12         . HYDROcodone-acetaminophen (NORCO/VICODIN) 5-325 MG per tablet   Oral   Take 1 tablet by mouth every 6 (six) hours as needed for pain.          BP 111/71  Pulse 70  Temp(Src) 98.7 F (37.1 C) (Oral)  Resp 18  SpO2 98%  LMP 07/12/2012 Physical Exam  Nursing note and vitals reviewed. Constitutional: She is oriented to person, place, and time. She appears well-developed and well-nourished.  Non-toxic appearance. No distress.  HENT:  Head:  Normocephalic and atraumatic.  Eyes: Conjunctivae, EOM and lids are normal. Pupils are equal, round, and reactive to light.  Neck: Normal range of motion. Neck supple. No tracheal deviation present. No mass present.  Cardiovascular: Normal rate, regular rhythm and normal heart sounds.  Exam reveals no gallop.   No murmur heard. Pulmonary/Chest: Effort normal and breath sounds normal. No stridor. No respiratory distress. She has no decreased breath sounds. She has no wheezes. She has no rhonchi. She has no rales.  Abdominal: Soft. Normal appearance and bowel sounds are normal. She exhibits no distension. There is no tenderness. There is no rebound and no CVA tenderness.  Musculoskeletal: Normal range of motion. She exhibits no edema and no tenderness.       Legs: Neurological: She is alert and oriented to person, place, and time. She has normal strength. No cranial nerve deficit or sensory deficit. GCS eye subscore is 4. GCS verbal subscore is 5. GCS motor subscore is 6.  Skin: Skin is warm and dry. No abrasion and no rash noted.  Psychiatric: She has a normal mood and affect. Her speech is normal and behavior is normal.    ED Course  Procedures (including critical care time) Labs Reviewed  CBC WITH DIFFERENTIAL  BASIC METABOLIC PANEL   No results found. No diagnosis found.  MDM  Patient started on vancomycin and will be admitted to medicine  Toy Baker, MD 07/13/12 2045

## 2012-07-13 NOTE — ED Notes (Signed)
Pt seen on Friday for what the pt thought was an ant bite, pt states she noticed the abscess on Thursday. Pt states it increased in size and in redness overnight and she sought medical tx Friday. Pt sent home on antibiotics and dx with cellulitis. Pt states she was informed to come back if the swelling got worse.

## 2012-07-13 NOTE — ED Notes (Signed)
Area of induration marked and dated with black ink by RN

## 2012-07-13 NOTE — ED Notes (Signed)
Been taking oral antibiotics and pain meds as she should; reports blackening around sight; redness worsening. Afebrile now but reports chills last night.

## 2012-07-13 NOTE — ED Notes (Signed)
Patient transported to CT 

## 2012-07-13 NOTE — Progress Notes (Signed)
Admission md has been called. Pt arrived from ed via stretcher. Pt was put in her bed, oriented to her room, iv fluids set up, and pt in bed comfortably.

## 2012-07-14 ENCOUNTER — Encounter (HOSPITAL_COMMUNITY): Payer: Self-pay | Admitting: Internal Medicine

## 2012-07-14 DIAGNOSIS — Z720 Tobacco use: Secondary | ICD-10-CM

## 2012-07-14 DIAGNOSIS — F17200 Nicotine dependence, unspecified, uncomplicated: Secondary | ICD-10-CM

## 2012-07-14 DIAGNOSIS — Z87891 Personal history of nicotine dependence: Secondary | ICD-10-CM

## 2012-07-14 DIAGNOSIS — L0390 Cellulitis, unspecified: Secondary | ICD-10-CM

## 2012-07-14 LAB — CBC
MCH: 31 pg (ref 26.0–34.0)
MCHC: 33.3 g/dL (ref 30.0–36.0)
MCV: 92.9 fL (ref 78.0–100.0)
Platelets: 252 10*3/uL (ref 150–400)
RBC: 4.07 MIL/uL (ref 3.87–5.11)
RDW: 13.8 % (ref 11.5–15.5)

## 2012-07-14 LAB — BASIC METABOLIC PANEL
BUN: 12 mg/dL (ref 6–23)
CO2: 22 mEq/L (ref 19–32)
Calcium: 8.2 mg/dL — ABNORMAL LOW (ref 8.4–10.5)
Creatinine, Ser: 0.81 mg/dL (ref 0.50–1.10)
Glucose, Bld: 117 mg/dL — ABNORMAL HIGH (ref 70–99)

## 2012-07-14 MED ORDER — SODIUM CHLORIDE 0.9 % IV SOLN: INTRAVENOUS | Status: DC

## 2012-07-14 MED ORDER — VANCOMYCIN HCL 10 G IV SOLR
1250.0000 mg | Freq: Three times a day (TID) | INTRAVENOUS | Status: DC
  Administered 2012-07-14 (×2): 1250 mg via INTRAVENOUS
  Filled 2012-07-14 (×3): qty 1250

## 2012-07-14 MED ORDER — ONDANSETRON HCL 4 MG/2ML IJ SOLN: 4.0000 mg | Freq: Four times a day (QID) | INTRAMUSCULAR | Status: DC | PRN

## 2012-07-14 MED ORDER — ACETAMINOPHEN 650 MG RE SUPP: 650.0000 mg | Freq: Four times a day (QID) | RECTAL | Status: DC | PRN

## 2012-07-14 MED ORDER — ONDANSETRON HCL 4 MG PO TABS: 4.0000 mg | ORAL_TABLET | Freq: Four times a day (QID) | ORAL | Status: DC | PRN

## 2012-07-14 MED ORDER — ACETAMINOPHEN 325 MG PO TABS: 650.0000 mg | ORAL_TABLET | Freq: Four times a day (QID) | ORAL | Status: DC | PRN

## 2012-07-14 MED ORDER — SULFAMETHOXAZOLE-TRIMETHOPRIM 800-160 MG PO TABS: 1.0000 | ORAL_TABLET | Freq: Two times a day (BID) | ORAL | Status: DC

## 2012-07-14 MED ORDER — HYDROCODONE-ACETAMINOPHEN 5-325 MG PO TABS: 1.0000 | ORAL_TABLET | Freq: Four times a day (QID) | ORAL | Status: DC | PRN

## 2012-07-14 NOTE — Progress Notes (Signed)
NURSING PROGRESS NOTE  Mary Davies [id number] Discharge Data: 07/14/2012 12:10 PM Attending Provider: Clydia Llano, MD PCP:No PCP Per Patient     [name] to be D/C'd Home per MD order.  Discussed with the patient the After Visit Summary and all questions fully answered. All IV's discontinued with no bleeding noted. All belongings returned to patient for patient to take home.   Last Vital Signs:  Blood pressure 127/73, pulse 87, temperature 98.3 F (36.8 C), temperature source Oral, resp. rate 18, height 5\' 4"  (1.626 m), weight 104 kg (229 lb 4.5 oz), last menstrual period 07/12/2012, SpO2 95.00%.  Discharge Medication List   Medication List         cephALEXin 500 MG capsule  Commonly known as:  KEFLEX  Take 500 mg by mouth 4 (four) times daily. For 7 days; Start date 07/10/12     HYDROcodone-acetaminophen 5-325 MG per tablet  Commonly known as:  NORCO/VICODIN  Take 1 tablet by mouth every 6 (six) hours as needed for pain.     sulfamethoxazole-trimethoprim 800-160 MG per tablet  Commonly known as:  SEPTRA DS  Take 1 tablet by mouth 2 (two) times daily.

## 2012-07-14 NOTE — Care Management Note (Signed)
    Page 1 of 1   07/14/2012     3:46:14 PM   CARE MANAGEMENT NOTE 07/14/2012  Patient:  Mary Davies, Mary Davies   Account Number:  [id number]  Date Initiated:  07/14/2012  Documentation initiated by:  Letha Cape  Subjective/Objective Assessment:   dx cellulitis  admit-lives with fiance. pta indep.     Action/Plan:   Anticipated DC Date:  07/14/2012   Anticipated DC Plan:  HOME/SELF CARE      DC Planning Services  CM consult  [facility]      Choice offered to / List presented to:             Status of service:  Completed, signed off Medicare Important Message given?   (If response is "NO", the following Medicare IM given date fields will be blank) Date Medicare IM given:   Date Additional Medicare IM given:    Discharge Disposition:  HOME/SELF CARE  Per UR Regulation:  Reviewed for med. necessity/level of care/duration of stay  If discussed at Long Length of Stay Meetings, dates discussed:    Comments:  07/14/12 15:43 Letha Cape RN, BSN [phone] patient lives with finace, pta indep.  Patient is being dc today with Bactrim which is on the $4 [employer] list, she has Keflex at home that she has been taking already, so she did not need any med ast.  Patient has transportation at dc. Patient has f/u appt at [facility].

## 2012-07-14 NOTE — Discharge Summary (Signed)
Physician Discharge Summary  [name] WUJ:[id number] DOB: Feb 25, 1990 DOA: 07/13/2012  PCP: No PCP Per Patient  Admit date: 07/13/2012 Discharge date: 07/14/2012  Time spent: 50 minutes  Recommendations for Outpatient Follow-up:  -Take both oral antibiotics as prescribed, and do not skip any doses.  Continue until full course is completed. -Monitor the cellulitis closely to make sure it continues to improve, and does not worsen. -Follow-up with outpatient clinic. -If it does not heal, and seems to be getting worse, please return to the hospital.  Monitor for signs of increased redness, tenderness to touch, fever, nausea, vomiting, and abdominal pain.   -Smoking cessation highly encouraged to improve overall health.  Discharge Diagnoses:  Principal Problem:   Cellulitis Active Problems:   Tobacco abuse   Discharge Condition: Stable.  Diet recommendation:  -Regular heart-healthy diet. -Daily exercise for at least 30 minutes is recommended.  Filed Weights   07/13/12 2209  Weight: 104 kg (229 lb 4.5 oz)    History of present illness:  Mary Davies is a 22 y.o. female who presents to the Emergency Department on 07/11/2012 complaining of an insect bite to her left thigh and left calf that occurred 3 days ago. She reports she scratched the bites, and the bite on her thigh became significantly more red and warm. She denies itching. She states she did not see an ant bite her, but she had stepped on an ant hill earlier in the day and had several ants crawling on her. She denies any other pain at this time.  Patient seems to be improving today, and states the redness on her left thigh has slowly subsided from the last area marked.  There is no drainage from the central site.  She denies nausea, vomiting, fever, chills, or chest pain.  She also denies diarrhea, and is having normal urination and bowel movements.  She has not had any pain at the site, but only slightly tender to touch  on exam.  Patient desires to be discharged because she has to move today.  She will be discharged with oral bactrim and keflex, and is highly advised to follow-up with the outpatient clinic to monitor for improvement/resolution.   Hospital Course:  Principle Problem: Cellulitis -noted on left thigh; area is about 14x10 inches in diameter; erythema seems to have improved per patient and from last area marked -CT of left femur with contrast - positive for cellulitis of left thigh and negative for evidence of osteomyelitis or foreign body -placed on IV vancomycin inpatient. -pain managed with acetaminophen and hydrocodone-acetaminophen prn -zofran prn for nausea -changed to oral bactrim and keflex on discharge -Patient was advised to stay in the hospital for continued IV antibiotics, but was unable to do so as she had to move today. -follow-up with outpatient clinic to monitor improvement  Active Problems: Tobacco Abuse -tobacco cessation counseling -denied nicotine patch while in the hospital  Procedures:  None  Consultations:  None  Discharge Exam: Filed Vitals:   07/13/12 1922 07/13/12 2030 07/13/12 2209 07/14/12 0442  BP: 118/74 114/76 117/83 127/73  Pulse: 65 70 75 87  Temp:   98.4 F (36.9 C) 98.3 F (36.8 C)  TempSrc:   Oral Oral  Resp: 13  20 18   Height:   5\' 4"  (1.626 m)   Weight:   104 kg (229 lb 4.5 oz)   SpO2: 100% 98% 97% 95%    General: patient awake, A&Ox 3, in no acute distress. Cardiovascular: RRR, without mgr. Respiratory: lungs  clear to auscultation bilaterally; without wheezes, rhonchi or rales. Abdomen:  soft, nontender, nondistended, BS+. Musculoskeletal: AROM of all 4 extremities, no edema noted. Skin: cellulitis noted on anterior left thigh; largest area of erythema is about 14x10 inches, seems to be improving from last area marked; central ulceration noted, but no drainage from this site; some tenderness noted with palpation.  Also scabbed lesion  approximately 5 in in length on posterior right thigh.  Discharge Instructions      Discharge Orders   Future Orders Complete By Expires     Diet - low sodium heart healthy  As directed     Increase activity slowly  As directed         Medication List         cephALEXin 500 MG capsule  Commonly known as:  KEFLEX  Take 500 mg by mouth 4 (four) times daily. For 7 days; Start date 07/10/12     HYDROcodone-acetaminophen 5-325 MG per tablet  Commonly known as:  NORCO/VICODIN  Take 1 tablet by mouth every 6 (six) hours as needed for pain.     sulfamethoxazole-trimethoprim 800-160 MG per tablet  Commonly known as:  SEPTRA DS  Take 1 tablet by mouth 2 (two) times daily.       Allergies  Allergen Reactions  . Pineapple Anaphylaxis      The results of significant diagnostics from this hospitalization (including imaging, microbiology, ancillary and laboratory) are listed below for reference.    Significant Diagnostic Studies: Ct Femur Left W Contrast  07/13/2012   *RADIOLOGY REPORT*  Clinical Data: Infection and swelling about the left hip after an insect bite.  CT OF THE LEFT FEMUR WITH CONTRAST  Contrast: OMNIPAQUE IOHEXOL 300 MG/ML  SOLN  Comparison: None.  Findings: A marker is placed in the region concern.  There is infiltration of subcutaneous fat subjacent to the marker consistent with cellulitis.  Skin immediately distal to the marker appears indurated.  No focal fluid collection is identified.  Visualized muscular and osseous structures appear normal.  Imaged intrapelvic contents are unremarkable.  IMPRESSION: Cellulitis about the anterior aspect of the left thigh.  Negative for abscess or evidence of osteomyelitis.  No foreign body is identified.   Original Report Authenticated By: Holley Dexter, M.D.    Labs: Basic Metabolic Panel:  Recent Labs Lab 07/13/12 1835 07/14/12 0602  NA 137 137  K 3.7 3.8  CL 102 106  CO2 28 22  GLUCOSE 83 117*  BUN 7 12   CREATININE 0.85 0.81  CALCIUM 8.8 8.2*   CBC:  Recent Labs Lab 07/13/12 1835 07/14/12 0602  WBC 10.3 10.3  NEUTROABS 6.0  --   HGB 12.9 12.6  HCT 38.2 37.8  MCV 92.0 92.9  PLT 235 252    Signed:  Tama Gander, PA-S Algis Downs, PA-C [facility] 07/14/2012, 11:55 AM

## 2012-07-14 NOTE — Progress Notes (Signed)
ANTIBIOTIC CONSULT NOTE - INITIAL  Pharmacy Consult for  Vancomycin Indication: cellulitis  Allergies  Allergen Reactions  . Pineapple Anaphylaxis    Patient Measurements: Height: 5\' 4"  (162.6 cm) Weight: 229 lb 4.5 oz (104 kg) IBW/kg (Calculated) : 54.7 Adjusted Body Weight: 75 kg  Vital Signs: Temp: 98.4 F (36.9 C) (06/30 2209) Temp src: Oral (06/30 2209) BP: 117/83 mmHg (06/30 2209) Pulse Rate: 75 (06/30 2209) Intake/Output from previous day: 06/30 0701 - 07/01 0700 In: -  Out: 1 [Urine:1] Intake/Output from this shift: Total I/O In: -  Out: 1 [Urine:1]  Labs:  Recent Labs  07/13/12 1835  WBC 10.3  HGB 12.9  PLT 235  CREATININE 0.85   Estimated Creatinine Clearance: 120.9 ml/min (by C-G formula based on Cr of 0.85). No results found for this basename: VANCOTROUGH, VANCOPEAK, VANCORANDOM, GENTTROUGH, GENTPEAK, GENTRANDOM, TOBRATROUGH, TOBRAPEAK, TOBRARND, AMIKACINPEAK, AMIKACINTROU, AMIKACIN,  in the last 72 hours   Microbiology: No results found for this or any previous visit (from the past 720 hour(s)).  Medical History: Past Medical History  Diagnosis Date  . Scabies     Medications:  Prescriptions prior to admission  Medication Sig Dispense Refill  . cephALEXin (KEFLEX) 500 MG capsule Take 500 mg by mouth 4 (four) times daily. For 7 days; Start date 07/10/12      . HYDROcodone-acetaminophen (NORCO/VICODIN) 5-325 MG per tablet Take 1 tablet by mouth every 6 (six) hours as needed for pain.       Assessment: 22 yo female with L thigh cellulitis for empiric antibiotics  Vancomycin 1 g IV given in ED at 1900  Goal of Therapy:  Vancomycin trough level 10-15 mcg/ml  Plan:  Vancomycin 1250 mg IV q8h  Eddie Candle 07/14/2012,12:24 AM

## 2012-07-14 NOTE — H&P (Signed)
[facility] History and Physical  Mary Davies WUJ:[id number] DOB: December 20, 1990 DOA: 07/13/2012  Referring physician: ER physician. PCP: No PCP Per Patient  Specialists: None.  Chief Complaint: Left thigh cellulitis worsening.  HPI: Mary Davies is a 22 y.o. female with no significant past medical history was recently diagnosed with cellulitis of the left thigh and has been on Keflex for last 2 days despite which her skin lesion has been progressing in size. Denies any fever chills. Patient states her skin condition started 4-5 days ago after an ant bite. Patient had come 2 weeks ago for an skin burn from heating on her right buttock area and during that time patient had received a tetanus toxoid. Patient at this time has been admitted for IV antibiotics as patient despite taking oral antibiotics has skin lesion progress. Denies any nausea vomiting abdominal pain chest pain shortness of breath.  Review of Systems: As presented in the history of presenting illness, rest negative.  Past Medical History  Diagnosis Date  . Scabies    History reviewed. No pertinent past surgical history. Social History:  reports that she has been smoking.  She does not have any smokeless tobacco history on file. She reports that she does not drink alcohol or use illicit drugs. Home. where does patient live-- Can do ADLs. Can patient participate in ADLs?  Allergies  Allergen Reactions  . Pineapple Anaphylaxis    History reviewed. No pertinent family history.    Prior to Admission medications   Medication Sig Start Date End Date Taking? Authorizing Provider  cephALEXin (KEFLEX) 500 MG capsule Take 500 mg by mouth 4 (four) times daily. For 7 days; Start date 07/10/12   Yes Historical Provider, MD  HYDROcodone-acetaminophen (NORCO/VICODIN) 5-325 MG per tablet Take 1 tablet by mouth every 6 (six) hours as needed for pain.   Yes Historical Provider, MD   Physical Exam: Filed Vitals:   07/13/12  1840 07/13/12 1922 07/13/12 2030 07/13/12 2209  BP: 122/88 118/74 114/76 117/83  Pulse: 70 65 70 75  Temp:    98.4 F (36.9 C)  TempSrc:    Oral  Resp: 18 13  20   Height:    5\' 4"  (1.626 m)  Weight:    104 kg (229 lb 4.5 oz)  SpO2: 100% 100% 98% 97%     General:  Well-developed well-nourished.  Eyes: Anicteric no pallor.  ENT: No discharge from the ears eyes nose or mouth.  Neck: No mass felt.  Cardiovascular: S1-S2 heard.  Respiratory: No rhonchi or crepitations.  Abdomen: Soft nontender bowel sounds present.  Skin: Area of erythema with a punctum on the anterior aspect of her left thigh proximal. Skin induration is almost 5 cm. There is also a scar on the right buttock area from recent burn.  Musculoskeletal: See skin description.  Psychiatric: Appears normal.  Neurologic: Alert awake oriented to time place and person. Moves all extremities.  Labs on Admission:  Basic Metabolic Panel:  Recent Labs Lab 07/13/12 1835  NA 137  K 3.7  CL 102  CO2 28  GLUCOSE 83  BUN 7  CREATININE 0.85  CALCIUM 8.8   Liver Function Tests: No results found for this basename: AST, ALT, ALKPHOS, BILITOT, PROT, ALBUMIN,  in the last 168 hours No results found for this basename: LIPASE, AMYLASE,  in the last 168 hours No results found for this basename: AMMONIA,  in the last 168 hours CBC:  Recent Labs Lab 07/13/12 1835  WBC 10.3  NEUTROABS 6.0  HGB 12.9  HCT 38.2  MCV 92.0  PLT 235   Cardiac Enzymes: No results found for this basename: CKTOTAL, CKMB, CKMBINDEX, TROPONINI,  in the last 168 hours  BNP (last 3 results) No results found for this basename: PROBNP,  in the last 8760 hours CBG: No results found for this basename: GLUCAP,  in the last 168 hours  Radiological Exams on Admission: Ct Femur Left W Contrast  07/13/2012   *RADIOLOGY REPORT*  Clinical Data: Infection and swelling about the left hip after an insect bite.  CT OF THE LEFT FEMUR WITH CONTRAST   Contrast: OMNIPAQUE IOHEXOL 300 MG/ML  SOLN  Comparison: None.  Findings: A marker is placed in the region concern.  There is infiltration of subcutaneous fat subjacent to the marker consistent with cellulitis.  Skin immediately distal to the marker appears indurated.  No focal fluid collection is identified.  Visualized muscular and osseous structures appear normal.  Imaged intrapelvic contents are unremarkable.  IMPRESSION: Cellulitis about the anterior aspect of the left thigh.  Negative for abscess or evidence of osteomyelitis.  No foreign body is identified.   Original Report Authenticated By: Holley Dexter, M.D.     Assessment/Plan Principal Problem:   Cellulitis   1. Cellulitis of left thigh - patient has been placed on IV vancomycin. Closely follow clinically. 2. Tobacco abuse - tobacco cessation counseling requested.    Code Status: Full code.  Family Communication: None.  Disposition Plan: Admit to inpatient.    Mary Davies N. [facility] Pager [phone].  If 7PM-7AM, please contact night-coverage [email or web address] Password [facility] 07/14/2012, 12:22 AM

## 2012-07-14 NOTE — Discharge Summary (Signed)
Addendum  Patient seen and examined, chart and data base reviewed.  I agree with the above assessment and plan.  For full details please see Mrs. Algis Downs PA note.   Clint Lipps, MD [facility] Pager: [phone] 07/14/2012, 1:44 PM

## 2012-07-21 ENCOUNTER — Inpatient Hospital Stay: Payer: Self-pay

## 2012-08-10 ENCOUNTER — Encounter: Payer: Self-pay | Admitting: Obstetrics and Gynecology

## 2012-08-10 ENCOUNTER — Other Ambulatory Visit (INDEPENDENT_AMBULATORY_CARE_PROVIDER_SITE_OTHER): Payer: Self-pay | Admitting: General Practice

## 2012-08-10 DIAGNOSIS — Z3202 Encounter for pregnancy test, result negative: Secondary | ICD-10-CM

## 2012-08-10 NOTE — Progress Notes (Signed)
Patient stated she knew she wasn't pregnant but her mother wouldn't let her move in unless she had a negative pregnancy test from her doctor stating she wasn't pregnant

## 2012-09-07 ENCOUNTER — Emergency Department (HOSPITAL_COMMUNITY)
Admission: EM | Admit: 2012-09-07 | Discharge: 2012-09-07 | Discharge status: Against Medical Advice | Payer: Self-pay | Attending: Emergency Medicine | Admitting: Emergency Medicine

## 2012-09-07 DIAGNOSIS — F17200 Nicotine dependence, unspecified, uncomplicated: Secondary | ICD-10-CM | POA: Insufficient documentation

## 2012-09-07 DIAGNOSIS — Z0000 Encounter for general adult medical examination without abnormal findings: Secondary | ICD-10-CM | POA: Insufficient documentation

## 2012-09-07 NOTE — ED Notes (Signed)
Attempted calling pt several times to triage pt.

## 2012-09-07 NOTE — ED Notes (Signed)
Called x4 for triage, No response. LWBS-before triage

## 2012-09-07 NOTE — ED Notes (Signed)
Called for triage with no response  

## 2012-09-08 ENCOUNTER — Emergency Department (HOSPITAL_COMMUNITY)
Admission: EM | Admit: 2012-09-08 | Discharge: 2012-09-08 | Disposition: A | Discharge status: Home / Self Care | Payer: Self-pay | Attending: Emergency Medicine | Admitting: Emergency Medicine

## 2012-09-08 ENCOUNTER — Encounter (HOSPITAL_COMMUNITY): Payer: Self-pay | Admitting: *Deleted

## 2012-09-08 DIAGNOSIS — F17200 Nicotine dependence, unspecified, uncomplicated: Secondary | ICD-10-CM | POA: Insufficient documentation

## 2012-09-08 DIAGNOSIS — N63 Unspecified lump in unspecified breast: Secondary | ICD-10-CM | POA: Insufficient documentation

## 2012-09-08 DIAGNOSIS — N644 Mastodynia: Secondary | ICD-10-CM | POA: Insufficient documentation

## 2012-09-08 DIAGNOSIS — Z872 Personal history of diseases of the skin and subcutaneous tissue: Secondary | ICD-10-CM | POA: Insufficient documentation

## 2012-09-08 DIAGNOSIS — Z3202 Encounter for pregnancy test, result negative: Secondary | ICD-10-CM | POA: Insufficient documentation

## 2012-09-08 NOTE — ED Provider Notes (Signed)
CSN: [id number]     Arrival date & time 09/08/12  1218 History   First MD Initiated Contact with Patient 09/08/12 1310     Chief Complaint  Patient presents with  . Breast Pain   (Consider location/radiation/quality/duration/timing/severity/associated sxs/prior Treatment) HPI Comments: Patient presenting with tenderness of her left breast that has been present for the past 3 weeks.  Tenderness gradually worsening.    She reports that there is a particular area just superior to the left areola that seems to be tender.  She states that 3-4 days ago she noticed a "knot" in that area.  She has not noticed any erythema, nipple drainage, edema, nipple inversion, or other changes to the skin.  Denies fever or chills. She reports that she started her menstrual cycle today.  She states that she does not regularly perform self breast exams.  The history is provided by the patient.    Past Medical History  Diagnosis Date  . Scabies    History reviewed. No pertinent past surgical history. No family history on file. History  Substance Use Topics  . Smoking status: Current Every Day Smoker  . Smokeless tobacco: Not on file  . Alcohol Use: No   OB History   Grav Para Term Preterm Abortions TAB SAB Ect Mult Living                 Review of Systems  All other systems reviewed and are negative.    Allergies  Pineapple  Home Medications  No current outpatient prescriptions on file. BP 143/80  Pulse 96  Temp(Src) 97.9 F (36.6 C) (Oral)  Resp 14  SpO2 97%  LMP 09/08/2012 Physical Exam  Nursing note and vitals reviewed. Constitutional: She appears well-developed and well-nourished.  HENT:  Head: Normocephalic and atraumatic.  Mouth/Throat: Oropharynx is clear and moist.  Cardiovascular: Normal rate, regular rhythm and normal heart sounds.   Pulmonary/Chest: Effort normal and breath sounds normal. Right breast exhibits no inverted nipple, no nipple discharge and no skin change. Left  breast exhibits tenderness. Left breast exhibits no inverted nipple, no nipple discharge and no skin change.    Diffuse fibrocystic changes of the left breast palpated.  No erythema, induration, or warmth of the left breast.  No abscess palpated or visualized.    Lymphadenopathy:    She has no axillary adenopathy.  Neurological: She is alert.  Skin: Skin is warm and dry.  Psychiatric: She has a normal mood and affect.    ED Course  Procedures (including critical care time) Labs Review Labs Reviewed - No data to display Imaging Review No results found.  MDM  No diagnosis found. Patient presenting with tenderness to the left breast.  Urine pregnancy negative.  No signs of abscess or infection.  On exam fibrocystic changes felt throughout the breast.  Patient given referral to Breast Center.  Return precautions given.    Pascal Lux Cortez, PA-C 09/09/12 1145

## 2012-09-08 NOTE — ED Notes (Signed)
Patient ambulated to restroom and tolerated well.  

## 2012-09-08 NOTE — ED Notes (Signed)
Patient has a small knot on left breast just above areola.  Firm to the touch.  Patient states that she has had L breast pain x 4 weeks.

## 2012-09-08 NOTE — ED Notes (Signed)
Pt is here with left side breast pain and has knot to the area.  No draining

## 2012-09-09 NOTE — ED Provider Notes (Signed)
Medical screening examination/treatment/procedure(s) were performed by non-physician practitioner and as supervising physician I was immediately available for consultation/collaboration.   Laray Anger, DO 09/09/12 2043

## 2012-09-16 ENCOUNTER — Other Ambulatory Visit: Payer: Self-pay

## 2012-09-24 ENCOUNTER — Encounter (HOSPITAL_COMMUNITY): Payer: Self-pay | Admitting: Certified Registered Nurse Anesthetist

## 2012-09-24 ENCOUNTER — Observation Stay (HOSPITAL_COMMUNITY): Payer: Self-pay | Admitting: Certified Registered Nurse Anesthetist

## 2012-09-24 ENCOUNTER — Encounter (HOSPITAL_COMMUNITY): Payer: Self-pay | Admitting: Emergency Medicine

## 2012-09-24 ENCOUNTER — Ambulatory Visit (HOSPITAL_COMMUNITY)
Admission: EM | Admit: 2012-09-24 | Discharge: 2012-09-25 | Disposition: A | Discharge status: Home / Self Care | Payer: Self-pay | Attending: Surgery | Admitting: Surgery

## 2012-09-24 ENCOUNTER — Encounter (HOSPITAL_COMMUNITY)
Admission: EM | Disposition: A | Discharge status: Home / Self Care | Payer: Self-pay | Source: Home / Self Care | Attending: Emergency Medicine

## 2012-09-24 DIAGNOSIS — N61 Mastitis without abscess: Secondary | ICD-10-CM | POA: Insufficient documentation

## 2012-09-24 DIAGNOSIS — N611 Abscess of the breast and nipple: Secondary | ICD-10-CM | POA: Diagnosis present

## 2012-09-24 HISTORY — PX: INCISION AND DRAINAGE ABSCESS: SHX5864

## 2012-09-24 LAB — POCT I-STAT, CHEM 8
Creatinine, Ser: 0.9 mg/dL (ref 0.50–1.10)
HCT: 39 % (ref 36.0–46.0)
Hemoglobin: 13.3 g/dL (ref 12.0–15.0)
Potassium: 3.8 mEq/L (ref 3.5–5.1)
Sodium: 140 mEq/L (ref 135–145)
TCO2: 22 mmol/L (ref 0–100)

## 2012-09-24 LAB — CBC WITH DIFFERENTIAL/PLATELET
Eosinophils Absolute: 0.5 10*3/uL (ref 0.0–0.7)
Eosinophils Relative: 5 % (ref 0–5)
Lymphs Abs: 2.7 10*3/uL (ref 0.7–4.0)
MCH: 31.3 pg (ref 26.0–34.0)
MCV: 92.5 fL (ref 78.0–100.0)
Platelets: 226 10*3/uL (ref 150–400)
RBC: 4.15 MIL/uL (ref 3.87–5.11)

## 2012-09-24 SURGERY — INCISION AND DRAINAGE, ABSCESS
Anesthesia: General | Laterality: Left

## 2012-09-24 MED ORDER — EPHEDRINE SULFATE 50 MG/ML IJ SOLN
INTRAMUSCULAR | Status: DC | PRN
  Administered 2012-09-24: 5 mg via INTRAVENOUS

## 2012-09-24 MED ORDER — SODIUM CHLORIDE 0.9 % IV SOLN
INTRAVENOUS | Status: DC
  Administered 2012-09-24: 75 mL/h via INTRAVENOUS

## 2012-09-24 MED ORDER — ENOXAPARIN SODIUM 40 MG/0.4ML ~~LOC~~ SOLN
40.0000 mg | SUBCUTANEOUS | Status: DC
  Filled 2012-09-24: qty 0.4

## 2012-09-24 MED ORDER — ONDANSETRON HCL 4 MG PO TABS: 4.0000 mg | ORAL_TABLET | Freq: Four times a day (QID) | ORAL | Status: DC | PRN

## 2012-09-24 MED ORDER — LACTATED RINGERS IV SOLN
INTRAVENOUS | Status: DC
  Administered 2012-09-24: 16:00:00 via INTRAVENOUS

## 2012-09-24 MED ORDER — LIDOCAINE HCL (CARDIAC) 20 MG/ML IV SOLN
INTRAVENOUS | Status: DC | PRN
  Administered 2012-09-24: 50 mg via INTRAVENOUS

## 2012-09-24 MED ORDER — ONDANSETRON HCL 4 MG/2ML IJ SOLN: 4.0000 mg | Freq: Four times a day (QID) | INTRAMUSCULAR | Status: DC | PRN

## 2012-09-24 MED ORDER — ARTIFICIAL TEARS OP OINT
TOPICAL_OINTMENT | OPHTHALMIC | Status: DC | PRN
  Administered 2012-09-24: 1 via OPHTHALMIC

## 2012-09-24 MED ORDER — FENTANYL CITRATE 0.05 MG/ML IJ SOLN
INTRAMUSCULAR | Status: DC | PRN
  Administered 2012-09-24: 100 ug via INTRAVENOUS
  Administered 2012-09-24: 50 ug via INTRAVENOUS

## 2012-09-24 MED ORDER — PIPERACILLIN-TAZOBACTAM 3.375 G IVPB
3.3750 g | Freq: Three times a day (TID) | INTRAVENOUS | Status: DC
  Administered 2012-09-24 – 2012-09-25 (×3): 3.375 g via INTRAVENOUS
  Filled 2012-09-24 (×5): qty 50

## 2012-09-24 MED ORDER — OXYCODONE-ACETAMINOPHEN 5-325 MG PO TABS
1.0000 | ORAL_TABLET | Freq: Once | ORAL | Status: AC
  Administered 2012-09-24: 1 via ORAL
  Filled 2012-09-24 (×2): qty 1

## 2012-09-24 MED ORDER — BUPIVACAINE HCL 0.25 % IJ SOLN
INTRAMUSCULAR | Status: DC | PRN
  Administered 2012-09-24: 30 mL

## 2012-09-24 MED ORDER — ONDANSETRON HCL 4 MG/2ML IJ SOLN
INTRAMUSCULAR | Status: DC | PRN
  Administered 2012-09-24: 4 mg via INTRAVENOUS

## 2012-09-24 MED ORDER — KCL IN DEXTROSE-NACL 20-5-0.9 MEQ/L-%-% IV SOLN
INTRAVENOUS | Status: DC
  Administered 2012-09-24: 21:00:00 via INTRAVENOUS
  Filled 2012-09-24 (×2): qty 1000

## 2012-09-24 MED ORDER — HYDROMORPHONE HCL PF 1 MG/ML IJ SOLN
0.2500 mg | INTRAMUSCULAR | Status: DC | PRN
  Administered 2012-09-24: 0.5 mg via INTRAVENOUS

## 2012-09-24 MED ORDER — ACETAMINOPHEN 650 MG RE SUPP: 650.0000 mg | Freq: Four times a day (QID) | RECTAL | Status: DC | PRN

## 2012-09-24 MED ORDER — PROPOFOL 10 MG/ML IV BOLUS
INTRAVENOUS | Status: DC | PRN
  Administered 2012-09-24: 200 mg via INTRAVENOUS

## 2012-09-24 MED ORDER — ONDANSETRON HCL 4 MG/2ML IJ SOLN: 4.0000 mg | Freq: Once | INTRAMUSCULAR | Status: DC | PRN

## 2012-09-24 MED ORDER — LACTATED RINGERS IV SOLN
INTRAVENOUS | Status: DC | PRN
  Administered 2012-09-24: 17:00:00 via INTRAVENOUS

## 2012-09-24 MED ORDER — 0.9 % SODIUM CHLORIDE (POUR BTL) OPTIME
TOPICAL | Status: DC | PRN
  Administered 2012-09-24: 1000 mL

## 2012-09-24 MED ORDER — MIDAZOLAM HCL 5 MG/5ML IJ SOLN
INTRAMUSCULAR | Status: DC | PRN
  Administered 2012-09-24: 2 mg via INTRAVENOUS

## 2012-09-24 MED ORDER — MORPHINE SULFATE 4 MG/ML IJ SOLN: 4.0000 mg | INTRAMUSCULAR | Status: DC | PRN

## 2012-09-24 MED ORDER — HYDROCODONE-ACETAMINOPHEN 5-325 MG PO TABS
1.0000 | ORAL_TABLET | ORAL | Status: DC | PRN
  Administered 2012-09-24 – 2012-09-25 (×3): 2 via ORAL
  Filled 2012-09-24 (×3): qty 2

## 2012-09-24 MED ORDER — BUPIVACAINE HCL (PF) 0.25 % IJ SOLN
INTRAMUSCULAR | Status: AC
  Filled 2012-09-24: qty 30

## 2012-09-24 MED ORDER — HYDROMORPHONE HCL PF 1 MG/ML IJ SOLN
INTRAMUSCULAR | Status: AC
  Filled 2012-09-24: qty 1

## 2012-09-24 MED ORDER — ACETAMINOPHEN 325 MG PO TABS: 650.0000 mg | ORAL_TABLET | Freq: Four times a day (QID) | ORAL | Status: DC | PRN

## 2012-09-24 MED ORDER — MORPHINE SULFATE 2 MG/ML IJ SOLN: 2.0000 mg | INTRAMUSCULAR | Status: DC | PRN

## 2012-09-24 SURGICAL SUPPLY — 35 items
BANDAGE GAUZE ELAST BULKY 4 IN (GAUZE/BANDAGES/DRESSINGS) IMPLANT
CANISTER SUCTION 2500CC (MISCELLANEOUS) ×2 IMPLANT
CLOTH BEACON ORANGE TIMEOUT ST (SAFETY) IMPLANT
COVER SURGICAL LIGHT HANDLE (MISCELLANEOUS) ×2 IMPLANT
DRAPE LAPAROSCOPIC ABDOMINAL (DRAPES) ×2 IMPLANT
DRAPE UTILITY 15X26 W/TAPE STR (DRAPE) ×4 IMPLANT
DRSG PAD ABDOMINAL 8X10 ST (GAUZE/BANDAGES/DRESSINGS) IMPLANT
ELECT CAUTERY BLADE 6.4 (BLADE) ×2 IMPLANT
ELECT REM PT RETURN 9FT ADLT (ELECTROSURGICAL) ×2
ELECTRODE REM PT RTRN 9FT ADLT (ELECTROSURGICAL) ×1 IMPLANT
GLOVE BIO SURGEON STRL SZ7.5 (GLOVE) ×2 IMPLANT
GLOVE BIOGEL PI IND STRL 6.5 (GLOVE) ×1 IMPLANT
GLOVE BIOGEL PI IND STRL 7.0 (GLOVE) ×1 IMPLANT
GLOVE BIOGEL PI INDICATOR 6.5 (GLOVE) ×1
GLOVE BIOGEL PI INDICATOR 7.0 (GLOVE) ×1
GLOVE ECLIPSE 6.5 STRL STRAW (GLOVE) ×2 IMPLANT
GLOVE SURG SIGNA 7.5 PF LTX (GLOVE) ×2 IMPLANT
GOWN STRL NON-REIN LRG LVL3 (GOWN DISPOSABLE) ×2 IMPLANT
GOWN STRL REIN XL XLG (GOWN DISPOSABLE) ×2 IMPLANT
KIT BASIN OR (CUSTOM PROCEDURE TRAY) ×2 IMPLANT
KIT ROOM TURNOVER OR (KITS) ×2 IMPLANT
NEEDLE HYPO 25GX1X1/2 BEV (NEEDLE) ×2 IMPLANT
NS IRRIG 1000ML POUR BTL (IV SOLUTION) ×2 IMPLANT
PACK GENERAL/GYN (CUSTOM PROCEDURE TRAY) ×2 IMPLANT
PAD ARMBOARD 7.5X6 YLW CONV (MISCELLANEOUS) ×4 IMPLANT
SPECIMEN JAR SMALL (MISCELLANEOUS) IMPLANT
SPONGE GAUZE 4X4 12PLY (GAUZE/BANDAGES/DRESSINGS) ×2 IMPLANT
SUT VIC AB 3-0 SH 27 (SUTURE) ×1
SUT VIC AB 3-0 SH 27X BRD (SUTURE) ×1 IMPLANT
SWAB COLLECTION DEVICE MRSA (MISCELLANEOUS) IMPLANT
SYR CONTROL 10ML LL (SYRINGE) ×2 IMPLANT
TAPE CLOTH SURG 4X10 WHT LF (GAUZE/BANDAGES/DRESSINGS) ×2 IMPLANT
TOWEL OR 17X24 6PK STRL BLUE (TOWEL DISPOSABLE) ×2 IMPLANT
TOWEL OR 17X26 10 PK STRL BLUE (TOWEL DISPOSABLE) ×2 IMPLANT
TUBE ANAEROBIC SPECIMEN COL (MISCELLANEOUS) IMPLANT

## 2012-09-24 NOTE — ED Provider Notes (Signed)
CSN: [id number]     Arrival date & time 09/24/12  1043 History   First MD Initiated Contact with Patient 09/24/12 1117     Chief Complaint  Patient presents with  . Breast Mass   (Consider location/radiation/quality/duration/timing/severity/associated sxs/prior Treatment) HPI Comments: Patient returns for continued complain of mass to left breast - she states that now the area has become red, swollen and extremely painful to the touch.  She denies fever, chills, history of breast cancer.  She reports no discharge from the nipple.  She has never been pregnant nor breastfeeding.  She states she was seen 2 weeks ago and told that she had cysts in her breasts and was sent to follow up at a cancer center who would not see her without an actual cancer diagnosis.  She has not had a mammogram or ultrasound of either breast.  Right breast without issues.  Patient is a 22 y.o. female presenting with female genitourinary complaint. The history is provided by the patient. No language interpreter was used.  Female GU Problem This is a new problem. The current episode started 1 to 4 weeks ago. The problem occurs constantly. The problem has been rapidly worsening. Associated symptoms include chest pain and a fever. Pertinent negatives include no abdominal pain, anorexia, arthralgias, chills, coughing, diaphoresis, headaches, myalgias, nausea, neck pain, numbness, rash, urinary symptoms, vomiting or weakness. Nothing aggravates the symptoms. She has tried nothing for the symptoms. The treatment provided no relief.    Past Medical History  Diagnosis Date  . Scabies    History reviewed. No pertinent past surgical history. History reviewed. No pertinent family history. History  Substance Use Topics  . Smoking status: Current Every Day Smoker  . Smokeless tobacco: Not on file  . Alcohol Use: No   OB History   Grav Para Term Preterm Abortions TAB SAB Ect Mult Living                 Review of Systems   Constitutional: Positive for fever. Negative for chills and diaphoresis.  HENT: Negative for neck pain.   Respiratory: Negative for cough.   Cardiovascular: Positive for chest pain.  Gastrointestinal: Negative for nausea, vomiting, abdominal pain and anorexia.  Musculoskeletal: Negative for myalgias and arthralgias.  Skin: Negative for rash.  Neurological: Negative for weakness, numbness and headaches.  All other systems reviewed and are negative.    Allergies  Pineapple  Home Medications  No current outpatient prescriptions on file. BP 143/86  Pulse 83  Temp(Src) 98.9 F (37.2 C) (Oral)  Resp 17  SpO2 95%  LMP 09/08/2012 Physical Exam  Nursing note and vitals reviewed. Constitutional: She is oriented to person, place, and time. She appears well-developed and well-nourished.  Uncomfortable appearing.  HENT:  Head: Normocephalic and atraumatic.  Right Ear: External ear normal.  Left Ear: External ear normal.  Nose: Nose normal.  Mouth/Throat: Oropharynx is clear and moist.  Eyes: Conjunctivae are normal. Pupils are equal, round, and reactive to light. No scleral icterus.  Neck: Normal range of motion. Neck supple.  Cardiovascular: Normal rate, regular rhythm and normal heart sounds.  Exam reveals no gallop and no friction rub.   No murmur heard. Pulmonary/Chest: Effort normal and breath sounds normal. She exhibits tenderness.  Abdominal: Soft. Bowel sounds are normal. She exhibits no distension. There is no tenderness.  Genitourinary: There is breast swelling and tenderness. No breast discharge.  Left breast with induration noted about 10cm surrounding the areola - there is a palpable  3cm mass just underneath the areola.  Bilateral nipple inversion.  Lymphadenopathy:    She has no cervical adenopathy.  Neurological: She is alert and oriented to person, place, and time.  Skin: Skin is warm and dry. No rash noted. There is erythema. No pallor.  Psychiatric: She has a  normal mood and affect. Her behavior is normal. Judgment and thought content normal.    ED Course  Procedures (including critical care time) Labs Review Labs Reviewed - No data to display Imaging Review General surgery called to see the patient here after [place] with Dr. Romeo Apple  MDM  Left breast mass   Dr. Romeo Apple in to see patient with me - ultrasound reveals ? Subareolar abscess vs cyst.  Given the size and the involvement with the nipple we have consulted general surgery.  They will be admitting the patient and taking her to the OR for exploration of this.  We have also consulted case management to assist the patient in obtaining supplies and medications after her stay.   Izola Price Marisue Humble, PA-C 09/24/12 1411

## 2012-09-24 NOTE — H&P (Signed)
Will need I and D of left breast in OR. Risks and benefits of surgery discussed with pt and she understands and wishes to proceed

## 2012-09-24 NOTE — ED Provider Notes (Signed)
Medical screening examination/treatment/procedure(s) were conducted as a shared visit with non-physician practitioner(s) and myself.  I personally evaluated the patient during the encounter  I examined and interviewed the patient. Lungs CTAB. Cardiac exam wnl. Abd soft. Bedside US shows subareolar abscess. Consulted surgery for eval.   Junius Argyle, MD 09/24/12 2034

## 2012-09-24 NOTE — H&P (Signed)
  Chief Complaint: left breast abscess HPI: Mary Davies is a healthy 22 year old female who presented to [facility] today with left sided breast mass.  She initially developed pain 1.5 months ago, which was mild in severity.  About 3 weeks ago, she developed a "knot" which has been gradually getting larger in size.  She was seen in the ED on 09/08/12 with pain at which time she did not have any erythema or induration.  She was subsequently referred to the breast cancer center, referred for an ultrasound, not completed to date.  The patient reports the pain is now "14/10." Erythema is worsening.  No associating symptoms such as fever, chills or sweats.  No modifying factors.  Denies breast asymmetry, nipple discharge.  Denies weight loss.  Denies family history of breast cancer.     She is otherwise healthy. 1/2ppd smoker.  Past Medical History  Diagnosis Date  . Scabies     History reviewed. No pertinent past surgical history.  History reviewed. No pertinent family history. Social History:  reports that she has been smoking Cigarettes.  She has been smoking about 0.50 packs per day. She does not have any smokeless tobacco history on file. She reports that she does not drink alcohol or use illicit drugs.  Allergies:  Allergies  Allergen Reactions  . Pineapple Anaphylaxis    Meds NONE  Review of Systems  Constitutional: Negative for fever, chills and weight loss.  Eyes: Negative for blurred vision.  Respiratory: Negative for cough and shortness of breath.   Cardiovascular: Positive for leg swelling. Negative for chest pain, palpitations and PND.  Gastrointestinal: Negative for nausea, vomiting and abdominal pain.  Genitourinary: Negative for dysuria.  Musculoskeletal: Negative for myalgias and joint pain.  Neurological: Positive for headaches. Negative for dizziness, tingling, tremors, seizures and loss of consciousness.    Blood pressure 111/74, pulse 69, temperature 98.9 F (37.2  C), temperature source Oral, resp. rate 18, last menstrual period 09/08/2012, SpO2 95.00%. Physical Exam  Constitutional: She is oriented to person, place, and time. She appears well-developed and well-nourished. No distress.  Cardiovascular: Normal rate, regular rhythm, normal heart sounds and intact distal pulses.  Exam reveals no gallop and no friction rub.   No murmur heard. Respiratory: Effort normal and breath sounds normal. No respiratory distress. She has no wheezes. She has no rales.  GI: Bowel sounds are normal. She exhibits no distension. There is no tenderness.  Musculoskeletal: Normal range of motion. She exhibits edema. She exhibits no tenderness.  Neurological: She is alert and oriented to person, place, and time.  Skin: Skin is warm and dry. She is not diaphoretic.  7x7cm area of induration left breast immediately superior to the areola with 17x12cm area of erythema, warmth and tenderness.  Psychiatric: Her behavior is normal. Judgment and thought content normal.  tearful     Assessment/Plan Left Breast Abscess Given the location and size of induration, the best option would be to proceed with incision and drainage under general anesthesia.  We discussed risks of the surgery and benefits of surgery.  She has been NPO since 6AM.  Start IVF. Start Zosyn, plan to obtain cultures in OR.  Obtain CBC and UPT.  Admit to observation, but hopefully can send her home late tonight or more preferably tomorrow.  Obtain case management consult to assist with home health for BID wet to dry dressing changes.    Ashok Norris ANP-BC Pager [phone]  09/24/2012, 1:38 PM

## 2012-09-24 NOTE — Op Note (Signed)
09/24/2012  5:22 PM  PATIENT:  Mary Davies  21 y.o. female  PRE-OPERATIVE DIAGNOSIS:  lt breast abscess  POST-OPERATIVE DIAGNOSIS:  left breast abscess  PROCEDURE:  Procedure(s): INCISION AND DRAINAGE BREAST ABSCESS (Left)  SURGEON:  Surgeon(s) and Role:    * Robyne Askew, MD - Primary  PHYSICIAN ASSISTANT:   ASSISTANTS: none   ANESTHESIA:   general  EBL:     BLOOD ADMINISTERED:none  DRAINS: none   LOCAL MEDICATIONS USED:  MARCAINE     SPECIMEN:  No Specimen  DISPOSITION OF SPECIMEN:  N/A  COUNTS:  YES  TOURNIQUET:  * No tourniquets in log *  DICTATION: .Dragon Dictation After informed consent was obtained patient was brought to the operating room and placed in the supine position on the operating room table. After adequate induction of general anesthesia the patient's left breast was prepped with ChloraPrep, allowed to dry, and draped in usual sterile manner. The patient had a large area of redness and induration in the 12:00 position of the left breast. A transversely oriented incision was made overlying the palpable mass with a 15 blade knife. This incision was carried through the skin and subcutaneous tissue sharply with the electrocautery until the abscess cavity was opened. Cultures were obtained. All loculations were broken up by blunt finger dissection. Once the abscess cavity was completely evacuated and drained then hemostasis was achieved using the Bovie electrocautery. The wound was then packed with a moistened 4 x 4 gauze. Sterile dressings were applied. The patient tolerated the procedure well. The wound was infiltrated with quarter percent Marcaine. The patient was then awakened and taken to recovery in stable condition.  PLAN OF CARE: Admit for overnight observation  PATIENT DISPOSITION:  PACU - hemodynamically stable.   Delay start of Pharmacological VTE agent (>24hrs) due to surgical blood loss or risk of bleeding: yes

## 2012-09-24 NOTE — Anesthesia Postprocedure Evaluation (Signed)
  Anesthesia Post-op Note  Patient: Mary Davies  Procedure(s) Performed: Procedure(s): INCISION AND DRAINAGE BREAST ABSCESS (Left)  Patient Location: PACU  Anesthesia Type:General  Level of Consciousness: awake, alert  and oriented  Airway and Oxygen Therapy: Patient Spontanous Breathing and Patient connected to nasal cannula oxygen  Post-op Pain: mild  Post-op Assessment: Post-op Vital signs reviewed  Post-op Vital Signs: Reviewed  Complications: No apparent anesthesia complications

## 2012-09-24 NOTE — Progress Notes (Signed)
   CARE MANAGEMENT ED NOTE 09/24/2012  Patient:  Mary Davies, Mary Davies   Account Number:  [id number]  Date Initiated:  09/24/2012  Documentation initiated by:  Fransico Michael  Subjective/Objective Assessment:   presented to ED with c/o breast pain     Subjective/Objective Assessment Detail:     Action/Plan:   disposition assistance   Action/Plan Detail:   Anticipated DC Date:       Status Recommendation to Physician:   Result of Recommendation:      DC Planning Services  CM consult  GCCN / P4HM (established/new)   [facility] Choice  HOME HEALTH   Choice offered to / List presented to:  C-1 Patient     HH arranged  HH-1 RN      [facility] agency  [facility]    Status of service:    ED Comments:   ED Comments Detail:  09/24/12- 1432-J.Masaji Billups,RN,BSN [phone]      [facility] called floor CM Heather to update on situation and also notified Corrie Dandy, RN at [facility] that patient was being admitted to [facility].  09/24/12-1407-J.Brytni Dray,RN,BSN [phone]      Spoke with [facility] from [facility] with referral for services. Angeline Slim that patient was going to be admitted as observation this afternoon.  09/24/12-1342-J.Keelia Graybill,RN,BSN [phone]     Received consult from Scarlette Calico, [place] to see patient for home health needs s/p discharge. In to see patient. Reports that she has been homeless for a long time, but recently started living with a friend in an apartment. Address given to [facility] of      [address] [place], [place] [address] with phone number of [phone].   Spoke to patient regarding home health needs and choices of agencies. Patient denies having medical insurance. [facility]. Also talked with patient regarding MATCH program for discharge antibiotics. Guidelines for use of MATCH program explained to patient. Voiced understanding.

## 2012-09-24 NOTE — Anesthesia Preprocedure Evaluation (Signed)
Anesthesia Evaluation  Patient identified by MRN, date of birth, ID band Patient awake    Reviewed: Allergy & Precautions, H&P , Patient's Chart, lab work & pertinent test results  Airway Mallampati: I TM Distance: >3 FB Neck ROM: full    Dental   Pulmonary          Cardiovascular Rhythm:regular Rate:Normal     Neuro/Psych    GI/Hepatic   Endo/Other    Renal/GU      Musculoskeletal   Abdominal   Peds  Hematology   Anesthesia Other Findings   Reproductive/Obstetrics                           Anesthesia Physical Anesthesia Plan  ASA: I  Anesthesia Plan: General   Post-op Pain Management:    Induction: Intravenous  Airway Management Planned: LMA  Additional Equipment:   Intra-op Plan:   Post-operative Plan: Extubation in OR  Informed Consent: I have reviewed the patients History and Physical, chart, labs and discussed the procedure including the risks, benefits and alternatives for the proposed anesthesia with the patient or authorized representative who has indicated his/her understanding and acceptance.     Plan Discussed with: CRNA, Anesthesiologist and Surgeon  Anesthesia Plan Comments:         Anesthesia Quick Evaluation

## 2012-09-24 NOTE — Transfer of Care (Signed)
Immediate Anesthesia Transfer of Care Note  Patient: Mary Davies  Procedure(s) Performed: Procedure(s): INCISION AND DRAINAGE BREAST ABSCESS (Left)  Patient Location: PACU  Anesthesia Type:General  Level of Consciousness: awake, alert , oriented and patient cooperative  Airway & Oxygen Therapy: Patient Spontanous Breathing  Post-op Assessment: Report given to PACU RN, Post -op Vital signs reviewed and stable and Patient moving all extremities X 4  Post vital signs: Reviewed and stable  Complications: No apparent anesthesia complications

## 2012-09-24 NOTE — ED Notes (Signed)
Pt here for left breast mass that is swollen and red with pain

## 2012-09-24 NOTE — Preoperative (Signed)
Beta Blockers   Reason not to administer Beta Blockers:Not Applicable 

## 2012-09-24 NOTE — ED Notes (Addendum)
Pt has half dollar size hard palpable mass to left nipple pt states has developed over the last 3 weeks. Red area encircling mass developed yesterday while at work. Area marked with marking pen.

## 2012-09-25 ENCOUNTER — Encounter (HOSPITAL_COMMUNITY): Payer: Self-pay | Admitting: General Surgery

## 2012-09-25 MED ORDER — HYDROCODONE-ACETAMINOPHEN 5-325 MG PO TABS: 1.0000 | ORAL_TABLET | ORAL | Status: DC | PRN

## 2012-09-25 MED ORDER — AMOXICILLIN-POT CLAVULANATE 875-125 MG PO TABS: 1.0000 | ORAL_TABLET | Freq: Two times a day (BID) | ORAL | Status: DC

## 2012-09-25 NOTE — Discharge Instructions (Signed)
Pack wound with saline wet to dry dressing at least once daily May shower and wash with soap and water

## 2012-09-25 NOTE — Discharge Summary (Signed)
I have seen and examined the patient and agree with the assessment and plans.  Rayneisha Bouza A. Joshu Furukawa  MD, FACS  

## 2012-09-25 NOTE — Care Management Note (Signed)
  Page 1 of 1   09/25/2012     10:05:28 AM   CARE MANAGEMENT NOTE 09/25/2012  Patient:  HAVANNAH, STREAT   Account Number:  [id number]  Date Initiated:  09/25/2012  Documentation initiated by:  Ronny Flurry  Subjective/Objective Assessment:     Action/Plan:   Anticipated DC Date:  09/25/2012   Anticipated DC Plan:           Choice offered to / List presented to:             Status of service:   Medicare Important Message given?   (If response is "NO", the following Medicare IM given date fields will be blank) Date Medicare IM given:   Date Additional Medicare IM given:    Discharge Disposition:    Per UR Regulation:    If discussed at Long Length of Stay Meetings, dates discussed:    Comments:  09-25-12 Patient already set up with [facility] in ED . Patient entered into the [facility] program with over rides for co pay and pain medication . Ronny Flurry RN BSN [phone]

## 2012-09-25 NOTE — Discharge Summary (Signed)
  Physician Discharge Summary  Patient ID: Mary Davies MRN: [id number] DOB/AGE: 06-Jan-1991 22 y.o.  Admit date: 09/24/2012 Discharge date: 09/25/2012  Admitting Diagnosis: Left breast abscess  Discharge Diagnosis Patient Active Problem List   Diagnosis Date Noted  . Left breast abscess 09/24/2012  . Cellulitis 07/14/2012  . Tobacco abuse 07/14/2012    Consultants none  Procedures Incision and drainage of left breast abscess  Hospital Course:  22 year old female who presented to [facility] today with left sided breast mass. She initially developed pain 1.5 months ago, which was mild in severity. About 3 weeks ago, she developed a "knot" which has been gradually getting larger in size. She was seen in the ED on 09/08/12 with pain at which time she did not have any erythema or induration. She was subsequently referred to the breast cancer center, referred for an ultrasound, not completed to date. The patient reports the pain is now "14/10." Erythema is worsening. She was admitted and underwent the procedure above.  She was started on IV abx.  On POD#1, the erythema was much better and her pain was better.  She will be discharged on Augmentin and wet to dry dressings changes.  She will follow up in 10-14 days for a wound check or sooner if needed.      Medication List         amoxicillin-clavulanate 875-125 MG per tablet  Commonly known as:  AUGMENTIN  Take 1 tablet by mouth 2 (two) times daily.     HYDROcodone-acetaminophen 5-325 MG per tablet  Commonly known as:  NORCO/VICODIN  Take 1-2 tablets by mouth every 4 (four) hours as needed.             Follow-up Information   Follow up with TOTH Mat Carne, MD. (our office will contact you with your appt day and time)    Specialty:  General Surgery   Contact information:   [address] [place] [address] [phone]       Signed: Denny Levy [facility] [phone]  09/25/2012,  7:38 AM

## 2012-09-28 ENCOUNTER — Telehealth (INDEPENDENT_AMBULATORY_CARE_PROVIDER_SITE_OTHER): Payer: Self-pay | Admitting: *Deleted

## 2012-09-28 ENCOUNTER — Telehealth (INDEPENDENT_AMBULATORY_CARE_PROVIDER_SITE_OTHER): Payer: Self-pay

## 2012-09-28 LAB — CULTURE, ROUTINE-ABSCESS: Gram Stain: NONE SEEN

## 2012-09-28 NOTE — Telephone Encounter (Signed)
Dois Davenport with [facility] called to report that patient told them that she is unable to have them come out until after 9p any night and would have to be seen before 6a any morning.  Patient states she is fine doing her own dressing changes.  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  Dois Davenport said they are just going to put that patient declines their services since she states she is unavailable during any appropriate times.

## 2012-09-28 NOTE — Telephone Encounter (Signed)
LMOM> pt is scheduled for follow up appt on 9/23 at 11:00 with Dr Carolynne Edouard.

## 2012-10-06 ENCOUNTER — Encounter (INDEPENDENT_AMBULATORY_CARE_PROVIDER_SITE_OTHER): Payer: Self-pay | Admitting: General Surgery

## 2012-10-15 ENCOUNTER — Encounter (INDEPENDENT_AMBULATORY_CARE_PROVIDER_SITE_OTHER): Payer: Self-pay | Admitting: General Surgery

## 2013-03-06 ENCOUNTER — Encounter (HOSPITAL_COMMUNITY): Payer: Self-pay | Admitting: Emergency Medicine

## 2013-03-06 ENCOUNTER — Emergency Department (HOSPITAL_COMMUNITY)
Admission: EM | Admit: 2013-03-06 | Discharge: 2013-03-06 | Discharge status: Against Medical Advice | Payer: Self-pay | Attending: Emergency Medicine | Admitting: Emergency Medicine

## 2013-03-06 DIAGNOSIS — F17200 Nicotine dependence, unspecified, uncomplicated: Secondary | ICD-10-CM | POA: Insufficient documentation

## 2013-03-06 DIAGNOSIS — L089 Local infection of the skin and subcutaneous tissue, unspecified: Secondary | ICD-10-CM | POA: Insufficient documentation

## 2013-03-06 NOTE — ED Notes (Signed)
Call but no answer.

## 2013-03-06 NOTE — ED Notes (Signed)
Pt called 3x times by RN at triage, No response x3. Pt does appears to have left.

## 2013-03-06 NOTE — ED Notes (Signed)
Unable to locate patient at this time.

## 2013-03-06 NOTE — ED Notes (Signed)
Rash to legs that has spread to buttocks and has pus coming from umbilicus

## 2013-03-28 ENCOUNTER — Encounter (HOSPITAL_COMMUNITY): Payer: Self-pay | Admitting: Emergency Medicine

## 2013-03-28 ENCOUNTER — Emergency Department (HOSPITAL_COMMUNITY)
Admission: EM | Admit: 2013-03-28 | Discharge: 2013-03-28 | Disposition: A | Discharge status: Home / Self Care | Payer: Self-pay | Attending: Emergency Medicine | Admitting: Emergency Medicine

## 2013-03-28 DIAGNOSIS — Z872 Personal history of diseases of the skin and subcutaneous tissue: Secondary | ICD-10-CM | POA: Insufficient documentation

## 2013-03-28 DIAGNOSIS — R1033 Periumbilical pain: Secondary | ICD-10-CM

## 2013-03-28 DIAGNOSIS — Y838 Other surgical procedures as the cause of abnormal reaction of the patient, or of later complication, without mention of misadventure at the time of the procedure: Secondary | ICD-10-CM | POA: Insufficient documentation

## 2013-03-28 DIAGNOSIS — N644 Mastodynia: Secondary | ICD-10-CM

## 2013-03-28 DIAGNOSIS — IMO0002 Reserved for concepts with insufficient information to code with codable children: Secondary | ICD-10-CM | POA: Insufficient documentation

## 2013-03-28 DIAGNOSIS — L03319 Cellulitis of trunk, unspecified: Secondary | ICD-10-CM

## 2013-03-28 DIAGNOSIS — F17200 Nicotine dependence, unspecified, uncomplicated: Secondary | ICD-10-CM | POA: Insufficient documentation

## 2013-03-28 DIAGNOSIS — L02219 Cutaneous abscess of trunk, unspecified: Secondary | ICD-10-CM | POA: Insufficient documentation

## 2013-03-28 MED ORDER — IBUPROFEN 800 MG PO TABS: 800.0000 mg | ORAL_TABLET | Freq: Three times a day (TID) | ORAL | Status: DC

## 2013-03-28 MED ORDER — CEPHALEXIN 500 MG PO CAPS: 500.0000 mg | ORAL_CAPSULE | Freq: Four times a day (QID) | ORAL | Status: DC

## 2013-03-28 NOTE — ED Notes (Signed)
Reports having surgery to left breast approx 6 months ago, had cyst removed but reports still having drainage from wound. Now also started having a drainage from her belly button but no other complaints. No acute distress noted at triage.

## 2013-03-28 NOTE — ED Notes (Signed)
Pt had lump removed from left breast 5-6 months ago.  Site has not healed, thick white drainage, redness.  No fevers.  Umbilicus draining thick white drainage.

## 2013-03-28 NOTE — ED Notes (Signed)
Pt did not follow up with surgeon after surgery.

## 2013-03-28 NOTE — ED Provider Notes (Signed)
CSN: [id number]     Arrival date & time 03/28/13  1220 History   First MD Initiated Contact with Patient 03/28/13 1411    This chart was scribed for Mary HarborShari Twisha Vanpelt PA-C, a non-physician practitioner working with Geoffery Lyonsouglas Delo, MD by Lewanda RifeAlexandra Hurtado, ED Scribe. This patient was seen in room TR07C/TR07C and the patient's care was started at 2:23 PM      Chief Complaint  Patient presents with  . Breast Problem     (Consider location/radiation/quality/duration/timing/severity/associated sxs/prior Treatment) The history is provided by the patient. No language interpreter was used.   HPI Comments: [name] is a 23 y.o. female who presents to the Emergency Department complaining of persistent drainage from left breast surgical wound onset 6 months. Reports she had a cyst removed 6 months ago in same area. States last drainage from surgical wound was "yesterday." States she has not followed up with [facility].  Reports associated "black spot" appeared yesterday, but area has been intermittently "hard". Denies any aggravating or alleviating factors. Denies associated fever, and chills.  Additionally, reports naval discharge onset several weeks. Denies naval piercing or injury. 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   Past Medical History  Diagnosis Date  . Scabies    Past Surgical History  Procedure Laterality Date  . Incision and drainage abscess Left 09/24/2012    Procedure: INCISION AND DRAINAGE BREAST ABSCESS;  Surgeon: Robyne AskewPaul S Toth III, MD;  Location: [facility] OR;  Service: General;  Laterality: Left;   History reviewed. No pertinent family history. History  Substance Use Topics  . Smoking status: Current Every Day Smoker -- 0.50 packs/day    Types: Cigarettes  . Smokeless tobacco: Not on file  . Alcohol Use: No   OB History   Grav Para Term Preterm Abortions TAB SAB Ect Mult Living                 Review of Systems  Constitutional: Negative for fever.   Skin: Positive for wound.  Psychiatric/Behavioral: Negative for confusion.      Allergies  Pineapple  Home Medications  No current outpatient prescriptions on file. BP 120/65  Pulse 79  Temp(Src) 98.6 F (37 C) (Oral)  Resp 18  SpO2 98%  LMP 03/13/2013 Physical Exam  Nursing note and vitals reviewed. Constitutional: She is oriented to person, place, and time. She appears well-developed and well-nourished. No distress.  HENT:  Head: Normocephalic and atraumatic.  Eyes: EOM are normal.  Neck: Neck supple. No tracheal deviation present.  Cardiovascular: Normal rate.   Pulmonary/Chest: Effort normal. No respiratory distress.  Musculoskeletal: Normal range of motion.  No naval discharge noted   Neurological: She is alert and oriented to person, place, and time.  Skin: Skin is warm and dry.  Linear surgical incisional scar on left areola inferior to the incision at midline. Small area of induration noted to the upper most areola.  No nipple discharge. No erythema. No peau d'orange noted.      Psychiatric: She has a normal mood and affect. Her behavior is normal.    ED Course  Procedures  COORDINATION OF CARE:  Nursing notes reviewed. Vital signs reviewed. Initial pt interview and examination performed.   2:35 PM-Discussed treatment plan with pt at bedside. Pt agrees with plan.   Treatment plan initiated:Medications - No data to display   Initial diagnostic testing ordered.     Labs Review Labs Reviewed - No data to display Imaging Review No  results found.   EKG Interpretation None      MDM   Final diagnoses:  None    1. Breast pain, left 2. Umbilical drainage  Umbilical drainage is minimal without surrounding redness or induration. No malodor. Doubt infection. Left breast is largely unremarkable - no redness, active drainage or wound dehiscence. There is abnormal healing to the incisional scar. Will refer back to Dr. Carolynne Edouard.   I personally performed  the services described in this documentation, which was scribed in my presence. The recorded information has been reviewed and is accurate.      Arnoldo Hooker, PA-C 03/28/13 1639

## 2013-03-28 NOTE — ED Notes (Signed)
Upon assessment 1" scar on left aerola, 1/2 pencil eraser size black area, and tiny raised bump beside black area.  No drainage noted.  Inside right umbilicus light pink, scant amount of drainage noted.

## 2013-03-28 NOTE — Discharge Instructions (Signed)
IRRIGATE THE NAVEL AS INSTRUCTED DAILY. FOLLOW UP WITH DR. TOTH FOR FURTHER EVALUATION OF PERSISTENT PAIN IN BREAST.

## 2013-03-30 NOTE — ED Provider Notes (Signed)
Medical screening examination/treatment/procedure(s) were performed by non-physician practitioner and as supervising physician I was immediately available for consultation/collaboration.     Karlita Lichtman, MD 03/30/13 2345 

## 2013-06-04 ENCOUNTER — Encounter (HOSPITAL_COMMUNITY): Payer: Self-pay | Admitting: Emergency Medicine

## 2013-06-04 ENCOUNTER — Emergency Department (HOSPITAL_COMMUNITY)
Admission: EM | Admit: 2013-06-04 | Discharge: 2013-06-04 | Disposition: A | Discharge status: Home / Self Care | Payer: Self-pay | Attending: Emergency Medicine | Admitting: Emergency Medicine

## 2013-06-04 DIAGNOSIS — F17200 Nicotine dependence, unspecified, uncomplicated: Secondary | ICD-10-CM | POA: Insufficient documentation

## 2013-06-04 DIAGNOSIS — Z792 Long term (current) use of antibiotics: Secondary | ICD-10-CM | POA: Insufficient documentation

## 2013-06-04 DIAGNOSIS — Z8619 Personal history of other infectious and parasitic diseases: Secondary | ICD-10-CM | POA: Insufficient documentation

## 2013-06-04 DIAGNOSIS — K0889 Other specified disorders of teeth and supporting structures: Secondary | ICD-10-CM

## 2013-06-04 DIAGNOSIS — Y929 Unspecified place or not applicable: Secondary | ICD-10-CM | POA: Insufficient documentation

## 2013-06-04 DIAGNOSIS — S025XXA Fracture of tooth (traumatic), initial encounter for closed fracture: Secondary | ICD-10-CM | POA: Insufficient documentation

## 2013-06-04 DIAGNOSIS — X58XXXA Exposure to other specified factors, initial encounter: Secondary | ICD-10-CM | POA: Insufficient documentation

## 2013-06-04 DIAGNOSIS — Y939 Activity, unspecified: Secondary | ICD-10-CM | POA: Insufficient documentation

## 2013-06-04 DIAGNOSIS — Z791 Long term (current) use of non-steroidal anti-inflammatories (NSAID): Secondary | ICD-10-CM | POA: Insufficient documentation

## 2013-06-04 MED ORDER — OXYCODONE-ACETAMINOPHEN 5-325 MG PO TABS
1.0000 | ORAL_TABLET | Freq: Once | ORAL | Status: AC
  Administered 2013-06-04: 1 via ORAL
  Filled 2013-06-04: qty 1

## 2013-06-04 MED ORDER — NAPROXEN 500 MG PO TABS: 500.0000 mg | ORAL_TABLET | Freq: Two times a day (BID) | ORAL | Status: DC

## 2013-06-04 MED ORDER — HYDROCODONE-ACETAMINOPHEN 5-325 MG PO TABS: 1.0000 | ORAL_TABLET | Freq: Four times a day (QID) | ORAL | Status: DC | PRN

## 2013-06-04 NOTE — ED Notes (Signed)
Patient with reported pain in the right upper back teeth.  She states the knows she is to have root canal but does not have insurance.  Patient took tylenol at 10am today.

## 2013-06-04 NOTE — ED Provider Notes (Signed)
CSN: [id number]     Arrival date & time 06/04/13  1754 History  This chart was scribed for non-physician practitioner working with Junius ArgyleForrest S Harrison, MD, by Jarvis Morganaylor Ferguson, ED Scribe. This patient was seen in room TR08C/TR08C and the patient's care was started at 7:25 PM.    Chief Complaint  Patient presents with  . Dental Pain      Patient is a 23 y.o. female presenting with tooth pain. The history is provided by the patient. No language interpreter was used.  Dental Pain Location:  Upper Upper teeth location:  5/RU 1st bicuspid Severity:  Severe Timing:  Constant Progression:  Worsening Chronicity:  New Context: dental fracture   Relieved by:  None tried Worsened by:  Cold food/drink and hot food/drink Ineffective treatments: Tylenol.  HPI Comments: Mary Davies is a 23 y.o. female who presents to the Emergency Department complaining of constant, gradually worsening, severe pain in her right upper back teeth. Patient states that the pain has been so severe that she has been unable to sleep and waking up every 20 minutes through the night. Patient states she is having trouble eating and drinking fluids and that exacerbates her pain. Patient states that her dentist told her she needs to have a double root canal but she does not have insurance and states she does not have the money to have that done. Patient states that she took Tylenol at 10 AM this morning with no relief.    Past Medical History  Diagnosis Date  . Scabies    Past Surgical History  Procedure Laterality Date  . Incision and drainage abscess Left 09/24/2012    Procedure: INCISION AND DRAINAGE BREAST ABSCESS;  Surgeon: Robyne AskewPaul S Toth III, MD;  Location: [facility] OR;  Service: General;  Laterality: Left;   No family history on file. History  Substance Use Topics  . Smoking status: Current Every Day Smoker -- 0.50 packs/day    Types: Cigarettes  . Smokeless tobacco: Not on file  . Alcohol Use: No   OB History   Grav Para Term Preterm Abortions TAB SAB Ect Mult Living                 Review of Systems  HENT: Positive for dental problem (Pain in her right upper "4th and 5th teeth").   All other systems reviewed and are negative.     Allergies  Pineapple  Home Medications   Prior to Admission medications   Medication Sig Start Date End Date Taking? Authorizing Provider  acetaminophen (TYLENOL) 500 MG tablet Take 1,000 mg by mouth every 6 (six) hours as needed for headache.    Historical Provider, MD  cephALEXin (KEFLEX) 500 MG capsule Take 1 capsule (500 mg total) by mouth 4 (four) times daily. 03/28/13   Shari A Upstill, PA-C  ibuprofen (ADVIL,MOTRIN) 800 MG tablet Take 1 tablet (800 mg total) by mouth 3 (three) times daily. 03/28/13   Shari A Upstill, PA-C   Triage Vitals: BP 140/81  Pulse 83  Temp(Src) 99.1 F (37.3 C) (Oral)  Resp 18  SpO2 98%  Physical Exam  Nursing note and vitals reviewed. Constitutional: She is oriented to person, place, and time. She appears well-developed and well-nourished. No distress.  HENT:  Head: Normocephalic and atraumatic.  Mouth/Throat:    No Gingival swelling  Eyes: EOM are normal.  Neck: Neck supple. No tracheal deviation present.  Cardiovascular: Normal rate.   Pulmonary/Chest: Effort normal. No respiratory distress.  Musculoskeletal: Normal range of  motion.  Neurological: She is alert and oriented to person, place, and time.  Skin: Skin is warm and dry.  Psychiatric: She has a normal mood and affect. Her behavior is normal.    ED Course  Procedures (including critical care time)  DIAGNOSTIC STUDIES: Oxygen Saturation is 98% on RA, normal by my interpretation.    COORDINATION OF CARE: 7:31 PM- Will discharge pt with Naprosyn and Vicodin.  Pt advised of plan for treatment and pt agrees.  Has just completed course of antibiotics.    Labs Review Labs Reviewed - No data to display  Imaging Review No results found.   EKG  Interpretation None      MDM   Final diagnoses:  None    Dental pain.  I personally performed the services described in this documentation, which was scribed in my presence. The recorded information has been reviewed and is accurate.    Jimmye Norman, NP 06/05/13 [phone]

## 2013-06-04 NOTE — Discharge Instructions (Signed)

## 2013-06-05 NOTE — ED Provider Notes (Signed)
Medical screening examination/treatment/procedure(s) were performed by non-physician practitioner and as supervising physician I was immediately available for consultation/collaboration.   EKG Interpretation None        Birdell Frasier S Zacari Stiff, MD 06/05/13 1230 

## 2014-02-16 ENCOUNTER — Encounter (HOSPITAL_COMMUNITY): Payer: Self-pay

## 2014-02-16 ENCOUNTER — Emergency Department (HOSPITAL_COMMUNITY)
Admission: EM | Admit: 2014-02-16 | Discharge: 2014-02-16 | Disposition: A | Discharge status: Home / Self Care | Payer: Self-pay | Attending: Emergency Medicine | Admitting: Emergency Medicine

## 2014-02-16 DIAGNOSIS — K088 Other specified disorders of teeth and supporting structures: Secondary | ICD-10-CM | POA: Insufficient documentation

## 2014-02-16 DIAGNOSIS — K029 Dental caries, unspecified: Secondary | ICD-10-CM | POA: Insufficient documentation

## 2014-02-16 DIAGNOSIS — Z720 Tobacco use: Secondary | ICD-10-CM | POA: Insufficient documentation

## 2014-02-16 DIAGNOSIS — K0889 Other specified disorders of teeth and supporting structures: Secondary | ICD-10-CM

## 2014-02-16 DIAGNOSIS — Z791 Long term (current) use of non-steroidal anti-inflammatories (NSAID): Secondary | ICD-10-CM | POA: Insufficient documentation

## 2014-02-16 DIAGNOSIS — R51 Headache: Secondary | ICD-10-CM | POA: Insufficient documentation

## 2014-02-16 DIAGNOSIS — Z8619 Personal history of other infectious and parasitic diseases: Secondary | ICD-10-CM | POA: Insufficient documentation

## 2014-02-16 DIAGNOSIS — Z792 Long term (current) use of antibiotics: Secondary | ICD-10-CM | POA: Insufficient documentation

## 2014-02-16 MED ORDER — OXYCODONE-ACETAMINOPHEN 5-325 MG PO TABS
1.0000 | ORAL_TABLET | Freq: Once | ORAL | Status: AC
  Administered 2014-02-16: 1 via ORAL
  Filled 2014-02-16: qty 1

## 2014-02-16 MED ORDER — AMOXICILLIN 500 MG PO CAPS: 500.0000 mg | ORAL_CAPSULE | Freq: Three times a day (TID) | ORAL | Status: DC

## 2014-02-16 MED ORDER — IBUPROFEN 600 MG PO TABS: 600.0000 mg | ORAL_TABLET | Freq: Four times a day (QID) | ORAL | Status: DC | PRN

## 2014-02-16 MED ORDER — HYDROCODONE-ACETAMINOPHEN 5-325 MG PO TABS: 1.0000 | ORAL_TABLET | Freq: Four times a day (QID) | ORAL | Status: DC | PRN

## 2014-02-16 NOTE — ED Notes (Signed)
Per [facility], pt from home for right upper molar pain. Pt has been told she needs to have a double root canal but has no insurance to get it done. Has been bothering her for two years but the cold weather makes it worse. Started hurting more this morning at 0300. Nurses it with OTC meds but pain this morning is bad.

## 2014-02-16 NOTE — Discharge Instructions (Signed)
Take amoxicillin as prescribed until all gone for infection. Ibuprofen for pain. norco for severe pain. Follow up with a dentist as referred.    Dental Pain A tooth ache may be caused by cavities (tooth decay). Cavities expose the nerve of the tooth to air and hot or cold temperatures. It may come from an infection or abscess (also called a boil or furuncle) around your tooth. It is also often caused by dental caries (tooth decay). This causes the pain you are having. DIAGNOSIS  Your caregiver can diagnose this problem by exam. TREATMENT   If caused by an infection, it may be treated with medications which kill germs (antibiotics) and pain medications as prescribed by your caregiver. Take medications as directed.  Only take over-the-counter or prescription medicines for pain, discomfort, or fever as directed by your caregiver.  Whether the tooth ache today is caused by infection or dental disease, you should see your dentist as soon as possible for further care. SEEK MEDICAL CARE IF: The exam and treatment you received today has been provided on an emergency basis only. This is not a substitute for complete medical or dental care. If your problem worsens or new problems (symptoms) appear, and you are unable to meet with your dentist, call or return to this location. SEEK IMMEDIATE MEDICAL CARE IF:   You have a fever.  You develop redness and swelling of your face, jaw, or neck.  You are unable to open your mouth.  You have severe pain uncontrolled by pain medicine. MAKE SURE YOU:   Understand these instructions.  Will watch your condition.  Will get help right away if you are not doing well or get worse. Document Released: 12/31/2004 Document Revised: 03/25/2011 Document Reviewed: 08/19/2007 [facility] Patient Information 2015 [place]. This information is not intended to replace advice given to you by your health care provider. Make sure you discuss any questions you have with  your health care provider.

## 2014-02-16 NOTE — ED Provider Notes (Signed)
CSN: [id number]     Arrival date & time 02/16/14  0724 History   First MD Initiated Contact with Patient 02/16/14 [phone]     Chief Complaint  Patient presents with  . Dental Pain     (Consider location/radiation/quality/duration/timing/severity/associated sxs/prior Treatment) HPI Mary Davies is a 24 y.o. female who presents to emergency department complaining of a toothache. Patient states she has had a bad tooth for several years. States it has been bothering her in the last few weeks. States that severe pain started around 3 AM this morning. She has not taken anything today for the toothache. She states she has been taking over-the-counter medications which helped just a little. She denies any fever, chills. No facial swelling. No dental injuries. She states she does not have insurance so unable to see a dentist.  Past Medical History  Diagnosis Date  . Scabies    Past Surgical History  Procedure Laterality Date  . Incision and drainage abscess Left 09/24/2012    Procedure: INCISION AND DRAINAGE BREAST ABSCESS;  Surgeon: Robyne Askew, MD;  Location: [facility] OR;  Service: General;  Laterality: Left;   No family history on file. History  Substance Use Topics  . Smoking status: Current Every Day Smoker -- 0.50 packs/day    Types: Cigarettes  . Smokeless tobacco: Not on file  . Alcohol Use: No   OB History    No data available     Review of Systems  Constitutional: Negative for fever and chills.  HENT: Positive for dental problem. Negative for sore throat, trouble swallowing and voice change.   Musculoskeletal: Negative for myalgias, arthralgias, neck pain and neck stiffness.  Skin: Negative for rash.  Neurological: Positive for headaches.  All other systems reviewed and are negative.     Allergies  Pineapple  Home Medications   Prior to Admission medications   Medication Sig Start Date End Date Taking? Authorizing Provider  acetaminophen (TYLENOL) 500 MG tablet Take  1,000 mg by mouth every 6 (six) hours as needed for headache.   Yes Historical Provider, MD  ibuprofen (ADVIL,MOTRIN) 800 MG tablet Take 1 tablet (800 mg total) by mouth 3 (three) times daily. 03/28/13  Yes Shari A Upstill, PA-C  naproxen (NAPROSYN) 500 MG tablet Take 1 tablet (500 mg total) by mouth 2 (two) times daily. 06/04/13  Yes Jimmye Norman, NP  cephALEXin (KEFLEX) 500 MG capsule Take 1 capsule (500 mg total) by mouth 4 (four) times daily. Patient not taking: Reported on 02/16/2014 03/28/13   Melvenia Beam A Upstill, PA-C  HYDROcodone-acetaminophen (NORCO/VICODIN) 5-325 MG per tablet Take 1 tablet by mouth every 6 (six) hours as needed. Patient not taking: Reported on 02/16/2014 06/04/13   Jimmye Norman, NP   BP 138/72 mmHg  Pulse 69  Temp(Src) 98 F (36.7 C) (Oral)  Resp 22  SpO2 100%  LMP 02/07/2014 Physical Exam  Constitutional: She is oriented to person, place, and time. She appears well-developed and well-nourished. No distress.  HENT:  Head: Normocephalic.  Carries noted in the right upper 2nd molar. Tooth is ttp. Also has tenderness over right upper 2nd bicuspid. No surrounding gum swelling or erythema. No facial swelling. No swelling under the tongue.   Eyes: Conjunctivae are normal.  Neck: Normal range of motion. Neck supple.  Cardiovascular: Normal rate, regular rhythm and normal heart sounds.   Musculoskeletal: She exhibits no edema.  Neurological: She is alert and oriented to person, place, and time.  Skin: Skin is warm and  dry.  Psychiatric: She has a normal mood and affect. Her behavior is normal.  Nursing note and vitals reviewed.   ED Course  Procedures (including critical care time) Labs Review Labs Reviewed - No data to display  Imaging Review No results found.   EKG Interpretation None      MDM   Final diagnoses:  Pain, dental     patient with dental pain, dental caries noted on exam. Will start on Amoxil for possible infection, Percocet given in  emergency department, patient is crying. Will discharge home with ibuprofen, Norco, follow-up with dentist.  Filed Vitals:   02/16/14 0736  BP: 138/72  Pulse: 69  Temp: 98 F (36.7 C)  TempSrc: Oral  Resp: 22  SpO2: 100%     Lottie Musselatyana A Shalondra Wunschel, PA-C 02/16/14 [phone]  Glynn OctaveStephen Rancour, MD 02/16/14 [phone]

## 2014-03-27 ENCOUNTER — Emergency Department (HOSPITAL_COMMUNITY)
Admission: EM | Admit: 2014-03-27 | Discharge: 2014-03-27 | Disposition: A | Discharge status: Home / Self Care | Payer: Self-pay | Attending: Emergency Medicine | Admitting: Emergency Medicine

## 2014-03-27 ENCOUNTER — Emergency Department (HOSPITAL_COMMUNITY): Payer: Self-pay

## 2014-03-27 ENCOUNTER — Encounter (HOSPITAL_COMMUNITY): Payer: Self-pay

## 2014-03-27 DIAGNOSIS — L03115 Cellulitis of right lower limb: Secondary | ICD-10-CM | POA: Insufficient documentation

## 2014-03-27 DIAGNOSIS — Z8619 Personal history of other infectious and parasitic diseases: Secondary | ICD-10-CM | POA: Insufficient documentation

## 2014-03-27 DIAGNOSIS — Z720 Tobacco use: Secondary | ICD-10-CM | POA: Insufficient documentation

## 2014-03-27 MED ORDER — CLINDAMYCIN HCL 150 MG PO CAPS: 300.0000 mg | ORAL_CAPSULE | Freq: Three times a day (TID) | ORAL | Status: DC

## 2014-03-27 MED ORDER — CLINDAMYCIN HCL 300 MG PO CAPS
300.0000 mg | ORAL_CAPSULE | Freq: Once | ORAL | Status: AC
  Administered 2014-03-27: 300 mg via ORAL
  Filled 2014-03-27: qty 1

## 2014-03-27 MED ORDER — OXYCODONE-ACETAMINOPHEN 5-325 MG PO TABS: 1.0000 | ORAL_TABLET | ORAL | Status: DC | PRN

## 2014-03-27 MED ORDER — OXYCODONE-ACETAMINOPHEN 5-325 MG PO TABS
2.0000 | ORAL_TABLET | Freq: Once | ORAL | Status: AC
  Administered 2014-03-27: 2 via ORAL
  Filled 2014-03-27: qty 2

## 2014-03-27 NOTE — Discharge Instructions (Signed)
Please follow up with your primary care physician in 1-2 days. If you do not have one please call the [facility] number listed above. Please take your antibiotic until completion. Please take pain medication and/or muscle relaxants as prescribed and as needed for pain. Please do not drive on narcotic pain medication or on muscle relaxants. Please read all discharge instructions and return precautions.  ° ° °Cellulitis °Cellulitis is an infection of the skin and the tissue beneath it. The infected area is usually red and tender. Cellulitis occurs most often in the arms and lower legs.  °CAUSES  °Cellulitis is caused by bacteria that enter the skin through cracks or cuts in the skin. The most common types of bacteria that cause cellulitis are staphylococci and streptococci. °SIGNS AND SYMPTOMS  °· Redness and warmth. °· Swelling. °· Tenderness or pain. °· Fever. °DIAGNOSIS  °Your health care provider can usually determine what is wrong based on a physical exam. Blood tests may also be done. °TREATMENT  °Treatment usually involves taking an antibiotic medicine. °HOME CARE INSTRUCTIONS  °· Take your antibiotic medicine as directed by your health care provider. Finish the antibiotic even if you start to feel better. °· Keep the infected arm or leg elevated to reduce swelling. °· Apply a warm cloth to the affected area up to 4 times per day to relieve pain. °· Take medicines only as directed by your health care provider. °· Keep all follow-up visits as directed by your health care provider. °SEEK MEDICAL CARE IF:  °· You notice red streaks coming from the infected area. °· Your red area gets larger or turns dark in color. °· Your bone or joint underneath the infected area becomes painful after the skin has healed. °· Your infection returns in the same area or another area. °· You notice a swollen bump in the infected area. °· You develop new symptoms. °· You have a fever. °SEEK IMMEDIATE MEDICAL CARE  IF:  °· You feel very sleepy. °· You develop vomiting or diarrhea. °· You have a general ill feeling (malaise) with muscle aches and pains. °MAKE SURE YOU:  °· Understand these instructions. °· Will watch your condition. °· Will get help right away if you are not doing well or get worse. °Document Released: 10/10/2004 Document Revised: 05/17/2013 Document Reviewed: 03/18/2011 °ExitCare® Patient Information ©2015 [employer], LLC. This information is not intended to replace advice given to you by your health care provider. Make sure you discuss any questions you have with your health care provider. ° °

## 2014-03-27 NOTE — ED Notes (Signed)
Pt with R foot pain.  Pt states she goes around bare foot a lot.  Pt states Thursday she wore boot and thought it was a blister.  Today there is a large abscess on the R foot with redness up to the ankle.

## 2014-03-27 NOTE — ED Provider Notes (Signed)
CSN: [id number]     Arrival date & time 03/27/14  4098 History   First MD Initiated Contact with Patient 03/27/14 [phone]     Chief Complaint  Patient presents with  . Foot Pain     (Consider location/radiation/quality/duration/timing/severity/associated sxs/prior Treatment) HPI Comments: Patient is a 24 yo F presenting to the ED for evaluation of R foot pain. She states she believes she developed a blister to the bottom of her right foot Thursday after wearing boots, but developed worsening pain and redness since then. No medications PTA. No modifying factors identified. Denies any fevers, chills, nausea, vomiting, diarrhea.    Past Medical History  Diagnosis Date  . Scabies    Past Surgical History  Procedure Laterality Date  . Incision and drainage abscess Left 09/24/2012    Procedure: INCISION AND DRAINAGE BREAST ABSCESS;  Surgeon: Robyne Askew, MD;  Location: [facility] OR;  Service: General;  Laterality: Left;   History reviewed. No pertinent family history. History  Substance Use Topics  . Smoking status: Current Every Day Smoker -- 0.50 packs/day    Types: Cigarettes  . Smokeless tobacco: Not on file  . Alcohol Use: No   OB History    No data available     Review of Systems  Musculoskeletal: Positive for myalgias.  Skin: Positive for color change.  All other systems reviewed and are negative.     Allergies  Pineapple  Home Medications   Prior to Admission medications   Medication Sig Start Date End Date Taking? Authorizing Provider  ibuprofen (ADVIL,MOTRIN) 200 MG tablet Take 800 mg by mouth every 6 (six) hours as needed.   Yes Historical Provider, MD  amoxicillin (AMOXIL) 500 MG capsule Take 1 capsule (500 mg total) by mouth 3 (three) times daily. Patient not taking: Reported on 03/27/2014 02/16/14   Lemont Fillers Kirichenko, PA-C  cephALEXin (KEFLEX) 500 MG capsule Take 1 capsule (500 mg total) by mouth 4 (four) times daily. Patient not taking: Reported on 02/16/2014  03/28/13   Elpidio Anis, PA-C  clindamycin (CLEOCIN) 150 MG capsule Take 2 capsules (300 mg total) by mouth 3 (three) times daily. May dispense as  capsules 03/27/14   Milbert Bixler, PA-C  HYDROcodone-acetaminophen (NORCO) 5-325 MG per tablet Take 1 tablet by mouth every 6 (six) hours as needed for moderate pain. Patient not taking: Reported on 03/27/2014 02/16/14   Jaynie Crumble, PA-C  ibuprofen (ADVIL,MOTRIN) 600 MG tablet Take 1 tablet (600 mg total) by mouth every 6 (six) hours as needed. Patient not taking: Reported on 03/27/2014 02/16/14   Lemont Fillers Kirichenko, PA-C  naproxen (NAPROSYN) 500 MG tablet Take 1 tablet (500 mg total) by mouth 2 (two) times daily. Patient not taking: Reported on 03/27/2014 06/04/13   Felicie Morn, NP  oxyCODONE-acetaminophen (PERCOCET/ROXICET) 5-325 MG per tablet Take 1 tablet by mouth every 4 (four) hours as needed for severe pain. May take 2 tablets PO q 6 hours for severe pain - Do not take with Tylenol as this tablet already contains tylenol 03/27/14   Rahn Lacuesta, PA-C   BP 120/65 mmHg  Pulse 85  Temp(Src) 98.4 F (36.9 C) (Oral)  Resp 22  SpO2 99%  LMP 03/13/2014 Physical Exam  Constitutional: She is oriented to person, place, and time. She appears well-developed and well-nourished. No distress.  HENT:  Head: Normocephalic and atraumatic.  Right Ear: External ear normal.  Left Ear: External ear normal.  Nose: Nose normal.  Mouth/Throat: Oropharynx is clear and moist.  Eyes: Conjunctivae are  normal.  Neck: Normal range of motion. Neck supple.  Cardiovascular: Normal rate, regular rhythm, normal heart sounds and intact distal pulses.   Pulmonary/Chest: Effort normal and breath sounds normal.  Abdominal: Soft.  Musculoskeletal: Normal range of motion.       Right ankle: Normal.       Left ankle: Normal.       Right foot: There is tenderness.       Left foot: Normal.  R foot: see picture below. Blood blister noted to bottom of foot.  No fluctuance.   Neurological: She is alert and oriented to person, place, and time.  Skin: Skin is warm and dry. She is not diaphoretic.  Psychiatric: She has a normal mood and affect.  Nursing note and vitals reviewed.         ED Course  Procedures (including critical care time) Medications  oxyCODONE-acetaminophen (PERCOCET/ROXICET) 5-325 MG per tablet 2 tablet (2 tablets Oral Given 03/27/14 1039)  clindamycin (CLEOCIN) capsule 300 mg (300 mg Oral Given 03/27/14 1156)    Labs Review Labs Reviewed - No data to display  Imaging Review Dg Foot Complete Right  03/27/2014   CLINICAL DATA:  Right foot pain.  EXAM: RIGHT FOOT COMPLETE - 3+ VIEW  COMPARISON:  None  FINDINGS: There is no evidence of fracture or dislocation. There is no evidence of arthropathy or other focal bone abnormality. Mild diffuse soft tissue swelling.  IMPRESSION: 1. No acute bone abnormality. 2. Soft tissue swelling.   Electronically Signed   By: Signa Kellaylor  Stroud M.D.   On: 03/27/2014 11:22     EKG Interpretation None      MDM   Final diagnoses:  Cellulitis of right foot    Filed Vitals:   03/27/14 1144  BP: 120/65  Pulse: 85  Temp: 98.4 F (36.9 C)  Resp:    Afebrile, NAD, non-toxic appearing, AAOx4.  Neurovascularly intact. Normal sensation. No evidence of compartment syndrome. Suspect uncomplicated cellulitis based on limited area of involvement, minimal pain, no systemic signs of illness (eg, fever, chills, dehydration, altered mental status, tachypnea, tachycardia, hypotension), no risk factors for serious illness (eg, extremes of age, general debility, immunocompromised status).   PE reveals redness, swelling, mildly tender, warm to touch. Skin intact, No bleeding. No bullae. Non purulent. Non circumferential.  Borders are not elevated or sharply demarcated. Drew a line around the area of infection. Pt was instructed to return to the ED if area surpasses the boarder or pain intensifies. Patient  is agreeable to plan. Patient is stable at time of discharge. Patient d/w with Dr. Rubin PayorPickering, agrees with plan.       Francee PiccoloJennifer Kolt Mcwhirter, PA-C 03/27/14 1656  Benjiman CoreNathan Pickering, MD 03/30/14 [phone]

## 2014-03-27 NOTE — ED Notes (Signed)
Provider at the bedside.  

## 2014-05-20 ENCOUNTER — Emergency Department (HOSPITAL_COMMUNITY)
Admission: EM | Admit: 2014-05-20 | Discharge: 2014-05-20 | Disposition: A | Discharge status: Home / Self Care | Payer: Self-pay | Attending: Emergency Medicine | Admitting: Emergency Medicine

## 2014-05-20 ENCOUNTER — Encounter (HOSPITAL_COMMUNITY): Payer: Self-pay | Admitting: *Deleted

## 2014-05-20 DIAGNOSIS — Z8619 Personal history of other infectious and parasitic diseases: Secondary | ICD-10-CM | POA: Insufficient documentation

## 2014-05-20 DIAGNOSIS — Z720 Tobacco use: Secondary | ICD-10-CM | POA: Insufficient documentation

## 2014-05-20 DIAGNOSIS — B349 Viral infection, unspecified: Secondary | ICD-10-CM | POA: Insufficient documentation

## 2014-05-20 DIAGNOSIS — R112 Nausea with vomiting, unspecified: Secondary | ICD-10-CM

## 2014-05-20 DIAGNOSIS — Z3202 Encounter for pregnancy test, result negative: Secondary | ICD-10-CM | POA: Insufficient documentation

## 2014-05-20 DIAGNOSIS — R197 Diarrhea, unspecified: Secondary | ICD-10-CM

## 2014-05-20 DIAGNOSIS — E669 Obesity, unspecified: Secondary | ICD-10-CM | POA: Insufficient documentation

## 2014-05-20 LAB — URINALYSIS W MICROSCOPIC (NOT AT ARMC)
Bilirubin Urine: NEGATIVE
GLUCOSE, UA: NEGATIVE mg/dL
Hgb urine dipstick: NEGATIVE
Ketones, ur: NEGATIVE mg/dL
Leukocytes, UA: NEGATIVE
Nitrite: NEGATIVE
PH: 6 (ref 5.0–8.0)
Protein, ur: NEGATIVE mg/dL
Specific Gravity, Urine: 1.025 (ref 1.005–1.030)
Urobilinogen, UA: 0.2 mg/dL (ref 0.0–1.0)

## 2014-05-20 LAB — CBC WITH DIFFERENTIAL/PLATELET
Basophils Absolute: 0 10*3/uL (ref 0.0–0.1)
Basophils Relative: 0 % (ref 0–1)
EOS PCT: 4 % (ref 0–5)
Eosinophils Absolute: 0.4 10*3/uL (ref 0.0–0.7)
HEMATOCRIT: 41.7 % (ref 36.0–46.0)
HEMOGLOBIN: 14.6 g/dL (ref 12.0–15.0)
LYMPHS ABS: 2.5 10*3/uL (ref 0.7–4.0)
LYMPHS PCT: 28 % (ref 12–46)
MCH: 32.4 pg (ref 26.0–34.0)
MCHC: 35 g/dL (ref 30.0–36.0)
MCV: 92.5 fL (ref 78.0–100.0)
Monocytes Absolute: 0.7 10*3/uL (ref 0.1–1.0)
Monocytes Relative: 7 % (ref 3–12)
NEUTROS ABS: 5.6 10*3/uL (ref 1.7–7.7)
NEUTROS PCT: 61 % (ref 43–77)
Platelets: 351 10*3/uL (ref 150–400)
RBC: 4.51 MIL/uL (ref 3.87–5.11)
RDW: 13.5 % (ref 11.5–15.5)
WBC: 9.2 10*3/uL (ref 4.0–10.5)

## 2014-05-20 LAB — COMPREHENSIVE METABOLIC PANEL
ALBUMIN: 3.4 g/dL — AB (ref 3.5–5.0)
ALK PHOS: 54 U/L (ref 38–126)
ALT: 16 U/L (ref 14–54)
AST: 17 U/L (ref 15–41)
Anion gap: 7 (ref 5–15)
BILIRUBIN TOTAL: 0.5 mg/dL (ref 0.3–1.2)
BUN: 9 mg/dL (ref 6–20)
CO2: 24 mmol/L (ref 22–32)
Calcium: 8.6 mg/dL — ABNORMAL LOW (ref 8.9–10.3)
Chloride: 107 mmol/L (ref 101–111)
Creatinine, Ser: 0.98 mg/dL (ref 0.44–1.00)
GFR calc Af Amer: >60 mL/min (ref >60)
Glucose, Bld: 94 mg/dL (ref 70–99)
Potassium: 3.6 mmol/L (ref 3.5–5.1)
Sodium: 138 mmol/L (ref 135–145)
TOTAL PROTEIN: 6 g/dL — AB (ref 6.5–8.1)

## 2014-05-20 LAB — POC URINE PREG, ED: Preg Test, Ur: NEGATIVE

## 2014-05-20 MED ORDER — SODIUM CHLORIDE 0.9 % IV BOLUS (SEPSIS)
1000.0000 mL | Freq: Once | INTRAVENOUS | Status: AC
  Administered 2014-05-20: 1000 mL via INTRAVENOUS

## 2014-05-20 MED ORDER — ONDANSETRON 4 MG PO TBDP: ORAL_TABLET | ORAL | Status: DC

## 2014-05-20 NOTE — Discharge Instructions (Signed)
Diarrhea °Diarrhea is frequent loose and watery bowel movements. It can cause you to feel weak and dehydrated. Dehydration can cause you to become tired and thirsty, have a dry mouth, and have decreased urination that often is dark yellow. Diarrhea is a sign of another problem, most often an infection that will not last long. In most cases, diarrhea typically lasts 2-3 days. However, it can last longer if it is a sign of something more serious. It is important to treat your diarrhea as directed by your caregiver to lessen or prevent future episodes of diarrhea. °CAUSES  °Some common causes include: °· Gastrointestinal infections caused by viruses, bacteria, or parasites. °· Food poisoning or food allergies. °· Certain medicines, such as antibiotics, chemotherapy, and laxatives. °· Artificial sweeteners and fructose. °· Digestive disorders. °HOME CARE INSTRUCTIONS °· Ensure adequate fluid intake (hydration): Have 1 cup (8 oz) of fluid for each diarrhea episode. Avoid fluids that contain simple sugars or sports drinks, fruit juices, whole milk products, and sodas. Your urine should be clear or pale yellow if you are drinking enough fluids. Hydrate with an oral rehydration solution that you can purchase at pharmacies, retail stores, and online. You can prepare an oral rehydration solution at home by mixing the following ingredients together: °·  - tsp table salt. °· ¾ tsp baking soda. °·  tsp salt substitute containing potassium chloride. °· 1  tablespoons sugar. °· 1 L (34 oz) of water. °· Certain foods and beverages may increase the speed at which food moves through the gastrointestinal (GI) tract. These foods and beverages should be avoided and include: °· Caffeinated and alcoholic beverages. °· High-fiber foods, such as raw fruits and vegetables, nuts, seeds, and whole grain breads and cereals. °· Foods and beverages sweetened with sugar alcohols, such as xylitol, sorbitol, and mannitol. °· Some foods may be well  tolerated and may help thicken stool including: °· Starchy foods, such as rice, toast, pasta, low-sugar cereal, oatmeal, grits, baked potatoes, crackers, and bagels. °· Bananas. °· Applesauce. °· Add probiotic-rich foods to help increase healthy bacteria in the GI tract, such as yogurt and fermented milk products. °· Wash your hands well after each diarrhea episode. °· Only take over-the-counter or prescription medicines as directed by your caregiver. °· Take a warm bath to relieve any burning or pain from frequent diarrhea episodes. °SEEK IMMEDIATE MEDICAL CARE IF:  °· You are unable to keep fluids down. °· You have persistent vomiting. °· You have blood in your stool, or your stools are black and tarry. °· You do not urinate in 6-8 hours, or there is only a small amount of very dark urine. °· You have abdominal pain that increases or localizes. °· You have weakness, dizziness, confusion, or light-headedness. °· You have a severe headache. °· Your diarrhea gets worse or does not get better. °· You have a fever or persistent symptoms for more than 2-3 days. °· You have a fever and your symptoms suddenly get worse. °MAKE SURE YOU:  °· Understand these instructions. °· Will watch your condition. °· Will get help right away if you are not doing well or get worse. °Document Released: 12/21/2001 Document Revised: 05/17/2013 Document Reviewed: 09/08/2011 °ExitCare® Patient Information ©2015 [employer]. This information is not intended to replace advice given to you by your health care provider. Make sure you discuss any questions you have with your health care provider. ° °Nausea and Vomiting °Nausea is a sick feeling that often comes before throwing up (vomiting). Vomiting   is a reflex where stomach contents come out of your mouth. Vomiting can cause severe loss of body fluids (dehydration). Children and elderly adults can become dehydrated quickly, especially if they also have diarrhea. Nausea and vomiting are  symptoms of a condition or disease. It is important to find the cause of your symptoms. CAUSES   Direct irritation of the stomach lining. This irritation can result from increased acid production (gastroesophageal reflux disease), infection, food poisoning, taking certain medicines (such as nonsteroidal anti-inflammatory drugs), alcohol use, or tobacco use.  Signals from the brain.These signals could be caused by a headache, heat exposure, an inner ear disturbance, increased pressure in the brain from injury, infection, a tumor, or a concussion, pain, emotional stimulus, or metabolic problems.  An obstruction in the gastrointestinal tract (bowel obstruction).  Illnesses such as diabetes, hepatitis, gallbladder problems, appendicitis, kidney problems, cancer, sepsis, atypical symptoms of a heart attack, or eating disorders.  Medical treatments such as chemotherapy and radiation.  Receiving medicine that makes you sleep (general anesthetic) during surgery. DIAGNOSIS Your caregiver may ask for tests to be done if the problems do not improve after a few days. Tests may also be done if symptoms are severe or if the reason for the nausea and vomiting is not clear. Tests may include:  Urine tests.  Blood tests.  Stool tests.  Cultures (to look for evidence of infection).  X-rays or other imaging studies. Test results can help your caregiver make decisions about treatment or the need for additional tests. TREATMENT You need to stay well hydrated. Drink frequently but in small amounts.You may wish to drink water, sports drinks, clear broth, or eat frozen ice pops or gelatin dessert to help stay hydrated.When you eat, eating slowly may help prevent nausea.There are also some antinausea medicines that may help prevent nausea. HOME CARE INSTRUCTIONS   Take all medicine as directed by your caregiver.  If you do not have an appetite, do not force yourself to eat. However, you must continue to  drink fluids.  If you have an appetite, eat a normal diet unless your caregiver tells you differently.  Eat a variety of complex carbohydrates (rice, wheat, potatoes, bread), lean meats, yogurt, fruits, and vegetables.  Avoid high-fat foods because they are more difficult to digest.  Drink enough water and fluids to keep your urine clear or pale yellow.  If you are dehydrated, ask your caregiver for specific rehydration instructions. Signs of dehydration may include:  Severe thirst.  Dry lips and mouth.  Dizziness.  Dark urine.  Decreasing urine frequency and amount.  Confusion.  Rapid breathing or pulse. SEEK IMMEDIATE MEDICAL CARE IF:   You have blood or brown flecks (like coffee grounds) in your vomit.  You have black or bloody stools.  You have a severe headache or stiff neck.  You are confused.  You have severe abdominal pain.  You have chest pain or trouble breathing.  You do not urinate at least once every 8 hours.  You develop cold or clammy skin.  You continue to vomit for longer than 24 to 48 hours.  You have a fever. MAKE SURE YOU:   Understand these instructions.  Will watch your condition.  Will get help right away if you are not doing well or get worse. Document Released: 12/31/2004 Document Revised: 03/25/2011 Document Reviewed: 05/30/2010 [facility] Patient Information 2015 [place], [place]. This information is not intended to replace advice given to you by your health care provider. Make sure you discuss  any questions you have with your health care provider.  Please take your medications*could for nausea and vomiting. Please follow-up with primary care for further evaluation and management of your symptoms. Return to ED for new or worsening symptoms.

## 2014-05-20 NOTE — ED Provider Notes (Signed)
CSN: [id number]     Arrival date & time 05/20/14  1146 History   First MD Initiated Contact with Patient 05/20/14 1240     Chief Complaint  Patient presents with  . Abdominal Pain  . Diarrhea     (Consider location/radiation/quality/duration/timing/severity/associated sxs/prior Treatment) HPI [name] is a 24 y.o. female who comes in for evaluation of nausea, vomiting and diarrhea. Patient states for the past 3 days she has had diarrhea, nonbloody. She reports yesterday she experienced nausea with vomiting, nonbloody and nonbilious. Denies any nausea or vomiting today. Reports abdominal discomfort secondary to vomiting, but no abdominal pain at rest. Denies abdominal discomfort now. Denies fevers, chills, sore throat, chest pain, short of breath, pelvic pain, bloody or dark stools, numbness or weakness, syncope. No sick contacts. No recent travel, no recent antibiotic use. No other modifying factors.  Past Medical History  Diagnosis Date  . Scabies    Past Surgical History  Procedure Laterality Date  . Incision and drainage abscess Left 09/24/2012    Procedure: INCISION AND DRAINAGE BREAST ABSCESS;  Surgeon: Robyne AskewPaul S Toth III, MD;  Location: [facility] OR;  Service: General;  Laterality: Left;   History reviewed. No pertinent family history. History  Substance Use Topics  . Smoking status: Current Every Day Smoker -- 0.50 packs/day    Types: Cigarettes  . Smokeless tobacco: Not on file  . Alcohol Use: No   OB History    No data available     Review of Systems A 10 point review of systems was completed and was negative except for pertinent positives and negatives as mentioned in the history of present illness     Allergies  Pineapple  Home Medications   Prior to Admission medications   Medication Sig Start Date End Date Taking? Authorizing Provider  amoxicillin (AMOXIL) 500 MG capsule Take 1 capsule (500 mg total) by mouth 3 (three) times daily. Patient not taking:  Reported on 03/27/2014 02/16/14   Lemont Fillersatyana Kirichenko, PA-C  cephALEXin (KEFLEX) 500 MG capsule Take 1 capsule (500 mg total) by mouth 4 (four) times daily. Patient not taking: Reported on 02/16/2014 03/28/13   Elpidio AnisShari Upstill, PA-C  clindamycin (CLEOCIN) 150 MG capsule Take 2 capsules (300 mg total) by mouth 3 (three) times daily. May dispense as 150mg  capsules Patient not taking: Reported on 05/20/2014 03/27/14   Francee PiccoloJennifer Piepenbrink, PA-C  HYDROcodone-acetaminophen (NORCO) 5-325 MG per tablet Take 1 tablet by mouth every 6 (six) hours as needed for moderate pain. Patient not taking: Reported on 03/27/2014 02/16/14   Tatyana Kirichenko, PA-C  ibuprofen (ADVIL,MOTRIN) 200 MG tablet Take 800 mg by mouth every 6 (six) hours as needed.    Historical Provider, MD  ibuprofen (ADVIL,MOTRIN) 600 MG tablet Take 1 tablet (600 mg total) by mouth every 6 (six) hours as needed. Patient not taking: Reported on 03/27/2014 02/16/14   Lemont Fillersatyana Kirichenko, PA-C  naproxen (NAPROSYN) 500 MG tablet Take 1 tablet (500 mg total) by mouth 2 (two) times daily. Patient not taking: Reported on 03/27/2014 06/04/13   Felicie Mornavid Smith, NP  ondansetron [facility](ZOFRAN ODT) 4 MG disintegrating tablet 4mg  ODT q4 hours prn nausea/vomit 05/20/14   Joycie PeekBenjamin Tache Bobst, PA-C  oxyCODONE-acetaminophen (PERCOCET/ROXICET) 5-325 MG per tablet Take 1 tablet by mouth every 4 (four) hours as needed for severe pain. May take 2 tablets PO q 6 hours for severe pain - Do not take with Tylenol as this tablet already contains tylenol Patient not taking: Reported on 05/20/2014 03/27/14   Francee PiccoloJennifer Piepenbrink, PA-C  BP 109/71 mmHg  Pulse 72  Temp(Src) 98 F (36.7 C) (Oral)  Resp 16  SpO2 100%  LMP 04/29/2014 Physical Exam  Constitutional: She is oriented to person, place, and time. She appears well-developed and well-nourished.  Obese  HENT:  Head: Normocephalic and atraumatic.  Mouth/Throat: Oropharynx is clear and moist.  Eyes: Conjunctivae are normal. Pupils are equal,  round, and reactive to light. Right eye exhibits no discharge. Left eye exhibits no discharge. No scleral icterus.  Neck: Neck supple.  Cardiovascular: Normal rate, regular rhythm and normal heart sounds.   Pulmonary/Chest: Effort normal and breath sounds normal. No respiratory distress. She has no wheezes. She has no rales.  Abdominal: Soft. There is no tenderness.  Abdomen is soft, nondistended. No tenderness to palpation. No lesions or deformities noted. Negative McBurney's, Murphy's  Musculoskeletal: She exhibits no tenderness.  Neurological: She is alert and oriented to person, place, and time.  Cranial Nerves II-XII grossly intact  Skin: Skin is warm and dry. No rash noted.  Psychiatric: She has a normal mood and affect.  Nursing note and vitals reviewed.   ED Course  Procedures (including critical care time) Labs Review Labs Reviewed  COMPREHENSIVE METABOLIC PANEL - Abnormal; Notable for the following:    Calcium 8.6 (*)    Total Protein 6.0 (*)    Albumin 3.4 (*)    All other components within normal limits  URINALYSIS W MICROSCOPIC - Abnormal; Notable for the following:    Squamous Epithelial / LPF FEW (*)    All other components within normal limits  CBC WITH DIFFERENTIAL/PLATELET  POC URINE PREG, ED    Imaging Review No results found.   EKG Interpretation None     Meds given in ED:  Medications  sodium chloride 0.9 % bolus 1,000 mL (1,000 mLs Intravenous New Bag/Given 05/20/14 1335)    New Prescriptions   ONDANSETRON (ZOFRAN ODT) 4 MG DISINTEGRATING TABLET    4mg  ODT q4 hours prn nausea/vomit   Filed Vitals:   05/20/14 1152 05/20/14 1256 05/20/14 1341  BP: 131/51 121/107 109/71  Pulse: 89 69 72  Temp: 98 F (36.7 C)    TempSrc: Oral    Resp: 20 16 16   SpO2: 100% 98% 100%    MDM  Vitals stable, afebrile Feels better with IV fluids administered in ED. denies any discomfort at all. Tolerating PO in ED. No nausea, vomiting or diarrhea in ED. Repeat abd  exam benign, shows no tenderness. No Organomegaly.  Labs:  Pregnancy negative, labs otherwise noncontributory   No evidence of Acute Appendicitis, Cholecystitis, C. difficile, Acute infectious Colitis. No evidence of GI bleed. Doubt ectopic, torsion, TOA. Low suspicion for ischemia or other vascular compromise. Doubt Cardiovascular/Pulmonary etiology  I have personally reviewed all labs, imaging, nursing/previous notes during the patient's evaluation in the ED today. No evidence of other acute or emergent pathology that requires immediate intervention at this time. Pt stable, in good condition and is appropriate for discharge. DC with Zofran and instructions to follow up with PCP within 48 hrs for further evaluation and management of symptoms as well as return precautions.   Final diagnoses:  Non-intractable vomiting with nausea, vomiting of unspecified type  Diarrhea  Viral syndrome        Joycie PeekBenjamin Norell Brisbin, PA-C 05/20/14 1415  Benjiman CoreNathan Pickering, MD 05/20/14 [phone]

## 2014-05-20 NOTE — ED Notes (Signed)
Pt reports generalized abd pain and n/v/d x 3 days.

## 2014-09-15 ENCOUNTER — Emergency Department (HOSPITAL_COMMUNITY)
Admission: EM | Admit: 2014-09-15 | Discharge: 2014-09-15 | Disposition: A | Discharge status: Home / Self Care | Payer: Self-pay | Attending: Emergency Medicine | Admitting: Emergency Medicine

## 2014-09-15 ENCOUNTER — Encounter (HOSPITAL_COMMUNITY): Payer: Self-pay | Admitting: *Deleted

## 2014-09-15 DIAGNOSIS — K0381 Cracked tooth: Secondary | ICD-10-CM | POA: Insufficient documentation

## 2014-09-15 DIAGNOSIS — Z720 Tobacco use: Secondary | ICD-10-CM | POA: Insufficient documentation

## 2014-09-15 DIAGNOSIS — S025XXA Fracture of tooth (traumatic), initial encounter for closed fracture: Secondary | ICD-10-CM

## 2014-09-15 DIAGNOSIS — Z8619 Personal history of other infectious and parasitic diseases: Secondary | ICD-10-CM | POA: Insufficient documentation

## 2014-09-15 DIAGNOSIS — T85848A Pain due to other internal prosthetic devices, implants and grafts, initial encounter: Secondary | ICD-10-CM

## 2014-09-15 DIAGNOSIS — T8584XA Pain due to internal prosthetic devices, implants and grafts, not elsewhere classified, initial encounter: Secondary | ICD-10-CM | POA: Insufficient documentation

## 2014-09-15 DIAGNOSIS — Y831 Surgical operation with implant of artificial internal device as the cause of abnormal reaction of the patient, or of later complication, without mention of misadventure at the time of the procedure: Secondary | ICD-10-CM | POA: Insufficient documentation

## 2014-09-15 DIAGNOSIS — E669 Obesity, unspecified: Secondary | ICD-10-CM | POA: Insufficient documentation

## 2014-09-15 MED ORDER — PENICILLIN V POTASSIUM 500 MG PO TABS: 500.0000 mg | ORAL_TABLET | Freq: Three times a day (TID) | ORAL | Status: DC

## 2014-09-15 MED ORDER — IBUPROFEN 800 MG PO TABS
800.0000 mg | ORAL_TABLET | Freq: Once | ORAL | Status: AC
  Administered 2014-09-15: 800 mg via ORAL
  Filled 2014-09-15: qty 1

## 2014-09-15 MED ORDER — BUPIVACAINE-EPINEPHRINE (PF) 0.5% -1:200000 IJ SOLN
1.8000 mL | Freq: Once | INTRAMUSCULAR | Status: AC
  Administered 2014-09-15: 1.8 mL
  Filled 2014-09-15: qty 1.8

## 2014-09-15 NOTE — ED Notes (Signed)
MD at bedside. 

## 2014-09-15 NOTE — ED Notes (Signed)
Bed: ZO10 Expected date:  Expected time:  Means of arrival:  Comments: Ems- female, toothache

## 2014-09-15 NOTE — Discharge Instructions (Signed)
Dental Fracture °You have a dental fracture or injury. This can mean the tooth is loose, has a chip in the enamel or is broken. If just the outer enamel is chipped, there is a good chance the tooth will not become infected. The only treatment needed may be to smooth off a rough edge. Fractures into the deeper layers (dentin and pulp) cause greater pain and are more likely to become infected. These require you to see a dentist as soon as possible to save the tooth. °Loose teeth may need to be wired or bonded with a plastic splint to hold them in place. A paste may be painted on the open area of the broken tooth to reduce the pain. Antibiotics and pain medicine may be prescribed. Choosing a soft or liquid diet and rinsing the mouth out with warm water after meals may be helpful. °See your dentist as recommended. Failure to seek care or follow up with a dentist or other specialist as recommended could result in the loss of your tooth, infection, or permanent dental problems. °SEEK MEDICAL CARE IF:  °· You have increased pain not controlled with medicines. °· You have swelling around the tooth, in the face or neck. °· You have bleeding which starts, continues, or gets worse. °· You have a fever. °Document Released: 02/08/2004 Document Revised: 03/25/2011 Document Reviewed: 11/22/2008 °Exit[employer]® Patient Information ©2015 [employer]. This information is not intended to replace advice given to you by your health care provider. Make sure you discuss any questions you have with your health care provider. ° °

## 2014-09-15 NOTE — ED Notes (Signed)
Pt has been having dental pain for a couple of months. Woke up about 5:30 this am in excruciating pain 10/10-pt has her face covered and is tearful  Pain is left maxillary posterior area

## 2014-09-15 NOTE — ED Notes (Signed)
Supplies at bedside (no dental box)

## 2014-09-15 NOTE — ED Provider Notes (Signed)
CSN: [id number]     Arrival date & time 09/15/14  2130 History   First MD Initiated Contact with Patient 09/15/14 0745     Chief Complaint  Patient presents with  . Dental Pain     (Consider location/radiation/quality/duration/timing/severity/associated sxs/prior Treatment) HPI 24 rolled female with caries and fracture of her left lower molar tooth #17. She has had a previous filling. She states that the filling has fallen out and the tooth is fractured.  She states this occurred approximately a month ago. She describes worsening pain. She's been taking nonsteroidal anti-inflammatory medications without relief. She does not have any swelling of the gum or discharge. She denies any comorbidities. Past Medical History  Diagnosis Date  . Scabies    Past Surgical History  Procedure Laterality Date  . Incision and drainage abscess Left 09/24/2012    Procedure: INCISION AND DRAINAGE BREAST ABSCESS;  Surgeon: Robyne Askew, MD;  Location: [facility] OR;  Service: General;  Laterality: Left;   History reviewed. No pertinent family history. Social History  Substance Use Topics  . Smoking status: Current Every Day Smoker -- 0.50 packs/day    Types: Cigarettes  . Smokeless tobacco: None  . Alcohol Use: No   OB History    No data available     Review of Systems  All other systems reviewed and are negative.     Allergies  Pineapple  Home Medications   Prior to Admission medications   Medication Sig Start Date End Date Taking? Authorizing Provider  amoxicillin (AMOXIL) 500 MG capsule Take 1 capsule (500 mg total) by mouth 3 (three) times daily. Patient not taking: Reported on 03/27/2014 02/16/14   Lemont Fillers Kirichenko, PA-C  cephALEXin (KEFLEX) 500 MG capsule Take 1 capsule (500 mg total) by mouth 4 (four) times daily. Patient not taking: Reported on 02/16/2014 03/28/13   Elpidio Anis, PA-C  clindamycin (CLEOCIN) 150 MG capsule Take 2 capsules (300 mg total) by mouth 3 (three) times daily. May  dispense as  capsules Patient not taking: Reported on 05/20/2014 03/27/14   Francee Piccolo, PA-C  HYDROcodone-acetaminophen (NORCO) 5-325 MG per tablet Take 1 tablet by mouth every 6 (six) hours as needed for moderate pain. Patient not taking: Reported on 03/27/2014 02/16/14   Tatyana Kirichenko, PA-C  ibuprofen (ADVIL,MOTRIN) 200 MG tablet Take 800 mg by mouth every 6 (six) hours as needed.    Historical Provider, MD  ibuprofen (ADVIL,MOTRIN) 600 MG tablet Take 1 tablet (600 mg total) by mouth every 6 (six) hours as needed. Patient not taking: Reported on 03/27/2014 02/16/14   Lemont Fillers Kirichenko, PA-C  naproxen (NAPROSYN) 500 MG tablet Take 1 tablet (500 mg total) by mouth 2 (two) times daily. Patient not taking: Reported on 03/27/2014 06/04/13   Felicie Morn, NP  ondansetron [facility] ODT) 4 MG disintegrating tablet  ODT q4 hours prn nausea/vomit 05/20/14   Joycie Peek, PA-C  oxyCODONE-acetaminophen (PERCOCET/ROXICET) 5-325 MG per tablet Take 1 tablet by mouth every 4 (four) hours as needed for severe pain. May take 2 tablets PO q 6 hours for severe pain - Do not take with Tylenol as this tablet already contains tylenol Patient not taking: Reported on 05/20/2014 03/27/14   Victorino Dike Piepenbrink, PA-C   BP 124/72 mmHg  Pulse 80  Temp(Src) 98.5 F (36.9 C) (Oral)  Resp 20  SpO2 99% Physical Exam  Constitutional: She is oriented to person, place, and time. She appears well-developed.  Tearful obese female  HENT:  Head: Normocephalic and atraumatic.  Right  Ear: External ear normal.  Mouth/Throat:    Fracture tooth #17 with filling remaining in place  Eyes: EOM are normal. Pupils are equal, round, and reactive to light.  Neck: Normal range of motion.  Cardiovascular: Normal rate.   Pulmonary/Chest: Effort normal and breath sounds normal.  Musculoskeletal: Normal range of motion.  Neurological: She is alert and oriented to person, place, and time. No cranial nerve deficit. She exhibits  normal muscle tone. Coordination normal.  Skin: Skin is warm and dry.  Psychiatric: Judgment and thought content normal.  Vitals reviewed.   ED Course  Dental Date/Time: 09/15/2014 8:38 AM Performed by: Margarita Grizzle Authorized by: Margarita Grizzle Risks and benefits: risks, benefits and alternatives were discussed Time out: Immediately prior to procedure a "time out" was called to verify the correct patient, procedure, equipment, support staff and site/side marked as required. Local anesthesia used: yes Anesthesia: local infiltration Local anesthetic: bupivacaine 0.5% with epinephrine Anesthetic total: 3 ml Patient sedated: no Patient tolerance: Patient tolerated the procedure well with no immediate complications Comments: Patient injected with inferior alveolar block using landmarks and local buccal block of tooth #17 with good anesthesia   (including critical care time) Labs Review Labs Reviewed - No data to display  Imaging Review No results found. I have personally reviewed and evaluated these images and lab results as part of my medical decision-making.   EKG Interpretation None      MDM   Final diagnoses:  Tooth fracture, closed, initial encounter  Dental implant pain, initial encounter        Margarita Grizzle, MD 09/15/14 [phone]

## 2014-12-30 ENCOUNTER — Emergency Department (HOSPITAL_COMMUNITY)
Admission: EM | Admit: 2014-12-30 | Discharge: 2014-12-30 | Disposition: A | Discharge status: Home / Self Care | Payer: Self-pay | Attending: Emergency Medicine | Admitting: Emergency Medicine

## 2014-12-30 ENCOUNTER — Encounter (HOSPITAL_COMMUNITY): Payer: Self-pay | Admitting: *Deleted

## 2014-12-30 DIAGNOSIS — F17210 Nicotine dependence, cigarettes, uncomplicated: Secondary | ICD-10-CM | POA: Insufficient documentation

## 2014-12-30 DIAGNOSIS — Z3202 Encounter for pregnancy test, result negative: Secondary | ICD-10-CM | POA: Insufficient documentation

## 2014-12-30 DIAGNOSIS — Z8619 Personal history of other infectious and parasitic diseases: Secondary | ICD-10-CM | POA: Insufficient documentation

## 2014-12-30 LAB — POC URINE PREG, ED: Preg Test, Ur: NEGATIVE

## 2014-12-30 NOTE — Discharge Instructions (Signed)
Pregnancy Test Information °WHAT IS A PREGNANCY TEST? °A pregnancy test is used to detect the presence of human chorionic gonadotropin (hCG) in a sample of your urine or blood. hCG is a hormone produced by the cells of the placenta. The placenta is the organ that forms to nourish and support a developing baby. °This test requires a sample of either blood or urine. A pregnancy test determines whether you are pregnant or not. °HOW ARE PREGNANCY TESTS DONE? °Pregnancy tests are done using a home pregnancy test or having a blood or urine test done at your health care provider's office.  °Home pregnancy tests require a urine sample. °· Most kits use a plastic testing device with a strip of paper that indicates whether there is hCG in your urine. °· Follow the test instructions very carefully. °· After you urinate on the test stick, markings will appear to let you know whether you are pregnant. °· For best results, use your first urine of the morning. That is when the concentration of hCG is highest. °Having a blood test to check for pregnancy requires a sample of blood drawn from a vein in your hand or arm. Your health care provider will send your sample to a lab for testing. Results of a pregnancy test will be positive or negative. °IS ONE TYPE OF PREGNANCY TEST BETTER THAN ANOTHER? °In some cases, a blood test will return a positive result even if a urine test was negative because blood tests are more sensitive. This means blood tests can detect hCG earlier than home pregnancy tests.  °HOW ACCURATE ARE HOME PREGNANCY TESTS?  °Both types of pregnancy tests are very accurate. °· A blood test is about 98% accurate. °· When you are far enough along in your pregnancy and when used correctly, home pregnancy tests are equally accurate. °CAN ANYTHING INTERFERE WITH HOME PREGNANCY TEST RESULTS?  °It is possible for certain conditions to cause an inaccurate test result (false positive or false negative). °· A false positive is a  positive test result when you are not pregnant. This can happen if you: °¨ Are taking certain medicines, including anticonvulsants or tranquilizers. °¨ Have certain proteins in your blood. °· A false negative is a negative test result when you are pregnant. This can happen if you: °¨ Took the test before there was enough hCG to detect. A pregnancy test will not be positive in most women until 3-4 weeks after conception. °¨ Drank a lot of liquid before the test. Diluted urine samples can sometimes give an inaccurate result. °¨ Take certain medicines, such as water pills (diuretics) or some antihistamines. °WHAT SHOULD I DO IF I HAVE A POSITIVE PREGNANCY TEST? °If you have a positive pregnancy test, schedule an appointment with your health care provider. You might need additional testing to confirm the pregnancy. In the meantime, begin taking a prenatal vitamin, stop smoking, stop drinking alcohol, and do not use street drugs. °Talk to your health care provider about how to take care of yourself during your pregnancy. Ask about what to expect from the care you will need throughout pregnancy (prenatal care). °  °This information is not intended to replace advice given to you by your health care provider. Make sure you discuss any questions you have with your health care provider. °  °Document Released: 01/03/2003 Document Revised: 01/21/2014 Document Reviewed: 04/27/2013 °Elsevier Interactive Patient Education ©2016 [employer] Inc[employer] ° °

## 2014-12-30 NOTE — ED Notes (Addendum)
Pt reports needing to have a pregnancy test so that she can continue to donate plasma. Denies any vaginal bleeding/discharge.

## 2014-12-30 NOTE — ED Provider Notes (Signed)
CSN: [id number]     Arrival date & time 12/30/14  1147 History  By signing my name below, I, Mary Davies, attest that this documentation has been prepared under the direction and in the presence of Teressa LowerVrinda Ryelynn Guedea, NP Electronically Signed: Charline BillsEssence Davies, ED Scribe 12/30/2014 at 1:39 PM.   Chief Complaint  Patient presents with  . Possible Pregnancy   The history is provided by the patient. No language interpreter was used.    HPI Comments: Mary Davies is a 24 y.o. female who presents to the Emergency Department for a pregnancy test. Pt donates plasma at a plasma center and states that she needs a note showing a negative pregnancy test in order to continue donating. Pt denies vaginal bleeding, vaginal discharge and any other urinary symptoms. Her LNMP was 1-2 weeks ago.   Past Medical History  Diagnosis Date  . Scabies    Past Surgical History  Procedure Laterality Date  . Incision and drainage abscess Left 09/24/2012    Procedure: INCISION AND DRAINAGE BREAST ABSCESS;  Surgeon: Robyne AskewPaul S Toth III, MD;  Location: [facility] OR;  Service: General;  Laterality: Left;   No family history on file. Social History  Substance Use Topics  . Smoking status: Current Every Day Smoker -- 0.50 packs/day    Types: Cigarettes  . Smokeless tobacco: None  . Alcohol Use: No   OB History    No data available     Review of Systems  Genitourinary: Negative.  Negative for vaginal bleeding and vaginal discharge.   Allergies  Pineapple  Home Medications   Prior to Admission medications   Medication Sig Start Date End Date Taking? Authorizing Provider  amoxicillin (AMOXIL) 500 MG capsule Take 1 capsule (500 mg total) by mouth 3 (three) times daily. Patient not taking: Reported on 03/27/2014 02/16/14   Jaynie Crumbleatyana Kirichenko, PA-C  clindamycin (CLEOCIN) 150 MG capsule Take 2 capsules (300 mg total) by mouth 3 (three) times daily. May dispense as 150mg  capsules Patient not taking: Reported on 05/20/2014  03/27/14   Francee PiccoloJennifer Piepenbrink, PA-C  HYDROcodone-acetaminophen (NORCO) 5-325 MG per tablet Take 1 tablet by mouth every 6 (six) hours as needed for moderate pain. Patient not taking: Reported on 03/27/2014 02/16/14   Tatyana Kirichenko, PA-C  ibuprofen (ADVIL,MOTRIN) 200 MG tablet Take 800 mg by mouth every 6 (six) hours as needed for headache, mild pain or moderate pain.     Historical Provider, MD  naproxen (NAPROSYN) 500 MG tablet Take 1 tablet (500 mg total) by mouth 2 (two) times daily. Patient not taking: Reported on 03/27/2014 06/04/13   Felicie Mornavid Smith, NP  naproxen sodium (ALEVE) 220 MG tablet Take 660-1,320 mg by mouth every 12 (twelve) hours as needed (pain).     Historical Provider, MD  ondansetron (ZOFRAN ODT) 4 MG disintegrating tablet 4mg  ODT q4 hours prn nausea/vomit Patient not taking: Reported on 09/15/2014 05/20/14   Joycie PeekBenjamin Cartner, PA-C  oxyCODONE-acetaminophen (PERCOCET/ROXICET) 5-325 MG per tablet Take 1 tablet by mouth every 4 (four) hours as needed for severe pain. May take 2 tablets PO q 6 hours for severe pain - Do not take with Tylenol as this tablet already contains tylenol Patient not taking: Reported on 05/20/2014 03/27/14   Francee PiccoloJennifer Piepenbrink, PA-C  penicillin v potassium (VEETID) 500 MG tablet Take 1 tablet (500 mg total) by mouth 3 (three) times daily. 09/15/14   Margarita Grizzleanielle Ray, MD  penicillin v potassium (VEETID) 500 MG tablet Take 1 tablet (500 mg total) by mouth 3 (three) times  daily. 09/15/14   Margarita Grizzle, MD   BP 132/102 mmHg  Pulse 86  Temp(Src) 98.5 F (36.9 C) (Oral)  Resp 16  SpO2 99% Physical Exam  Constitutional: She is oriented to person, place, and time. She appears well-developed and well-nourished. No distress.  HENT:  Head: Normocephalic and atraumatic.  Eyes: Conjunctivae and EOM are normal.  Neck: Neck supple. No tracheal deviation present.  Cardiovascular: Normal rate.   Pulmonary/Chest: Effort normal. No respiratory distress.  Musculoskeletal: Normal  range of motion.  Neurological: She is alert and oriented to person, place, and time.  Skin: Skin is warm and dry.  Psychiatric: She has a normal mood and affect. Her behavior is normal.  Nursing note and vitals reviewed.  ED Course  Procedures (including critical care time) DIAGNOSTIC STUDIES: Oxygen Saturation is 99% on RA, normal by my interpretation.    COORDINATION OF CARE: 12:34 PM-Discussed treatment plan which includes pregnancy test with pt at bedside and pt agreed to plan.   Labs Review Labs Reviewed  POC URINE PREG, ED   Imaging Review No results found. I have personally reviewed and evaluated these images and lab results as part of my medical decision-making.   EKG Interpretation None      MDM   Final diagnoses:  Pregnancy test negative    Pt is not pregnant  I personally performed the services described in this documentation, which was scribed in my presence. The recorded information has been reviewed and is accurate.    Teressa Lower, NP 12/30/14 1343  Loren Racer, MD 01/03/15 [phone]

## 2015-03-21 ENCOUNTER — Emergency Department (HOSPITAL_COMMUNITY)
Admission: EM | Admit: 2015-03-21 | Discharge: 2015-03-21 | Disposition: A | Discharge status: Home / Self Care | Payer: MEDICAID | Attending: Emergency Medicine | Admitting: Emergency Medicine

## 2015-03-21 ENCOUNTER — Encounter (HOSPITAL_COMMUNITY): Payer: Self-pay | Admitting: *Deleted

## 2015-03-21 DIAGNOSIS — F17210 Nicotine dependence, cigarettes, uncomplicated: Secondary | ICD-10-CM | POA: Insufficient documentation

## 2015-03-21 DIAGNOSIS — Z8619 Personal history of other infectious and parasitic diseases: Secondary | ICD-10-CM | POA: Insufficient documentation

## 2015-03-21 DIAGNOSIS — Z792 Long term (current) use of antibiotics: Secondary | ICD-10-CM | POA: Insufficient documentation

## 2015-03-21 DIAGNOSIS — N611 Abscess of the breast and nipple: Secondary | ICD-10-CM | POA: Insufficient documentation

## 2015-03-21 LAB — BASIC METABOLIC PANEL
Anion gap: 9 (ref 5–15)
BUN: 6 mg/dL (ref 6–20)
CALCIUM: 8.9 mg/dL (ref 8.9–10.3)
CO2: 27 mmol/L (ref 22–32)
CREATININE: 0.84 mg/dL (ref 0.44–1.00)
Chloride: 107 mmol/L (ref 101–111)
GFR calc non Af Amer: >60 mL/min (ref >60)
Glucose, Bld: 100 mg/dL — ABNORMAL HIGH (ref 65–99)
Potassium: 3.5 mmol/L (ref 3.5–5.1)
SODIUM: 143 mmol/L (ref 135–145)

## 2015-03-21 LAB — CBC
HCT: 42.5 % (ref 36.0–46.0)
Hemoglobin: 13.9 g/dL (ref 12.0–15.0)
MCH: 30.8 pg (ref 26.0–34.0)
MCHC: 32.7 g/dL (ref 30.0–36.0)
MCV: 94 fL (ref 78.0–100.0)
Platelets: 287 10*3/uL (ref 150–400)
RBC: 4.52 MIL/uL (ref 3.87–5.11)
RDW: 13.6 % (ref 11.5–15.5)
WBC: 8.8 10*3/uL (ref 4.0–10.5)

## 2015-03-21 MED ORDER — CLINDAMYCIN HCL 150 MG PO CAPS
450.0000 mg | ORAL_CAPSULE | Freq: Once | ORAL | Status: AC
  Administered 2015-03-21: 450 mg via ORAL
  Filled 2015-03-21: qty 3

## 2015-03-21 MED ORDER — HYDROCODONE-ACETAMINOPHEN 5-325 MG PO TABS: 1.0000 | ORAL_TABLET | Freq: Four times a day (QID) | ORAL | Status: DC | PRN

## 2015-03-21 MED ORDER — HYDROCODONE-ACETAMINOPHEN 5-325 MG PO TABS
2.0000 | ORAL_TABLET | Freq: Once | ORAL | Status: AC
  Administered 2015-03-21: 2 via ORAL
  Filled 2015-03-21: qty 2

## 2015-03-21 MED ORDER — CLINDAMYCIN HCL 150 MG PO CAPS: 450.0000 mg | ORAL_CAPSULE | Freq: Three times a day (TID) | ORAL | Status: DC

## 2015-03-21 NOTE — ED Notes (Signed)
Pt reports the bump on Lt breast just below nippled was worse starting on Monday. Pt reports first had an abscess on left breast  2 years ago.

## 2015-03-21 NOTE — ED Notes (Signed)
Declined W/C at D/C and was escorted to lobby by RN. 

## 2015-03-21 NOTE — Discharge Instructions (Signed)
Mary Davies,  Nice meeting you! Please follow-up with [facility] [phone]. You will need a referral to the [facility]. Return to the emergency department if you develop fevers, chills, increased pain. Feel better soon!  S. Lane HackerNicole Jehu Mccauslin, PA-C

## 2015-03-21 NOTE — ED Provider Notes (Signed)
CSN: [id number]     Arrival date & time 03/21/15  1250 History  By signing my name below, I, Soijett Blue, attest that this documentation has been prepared under the direction and in the presence of S. Lane Hacker, PA-C Electronically Signed: Soijett Blue, ED Scribe. 03/21/2015. 1:30 PM.   Chief Complaint  Patient presents with  . Breast Problem     The history is provided by the patient. No language interpreter was used.    Mary Davies is a 25 y.o. female who presents to the Emergency Department complaining of left breast problem onset yesterday. Pt notes that the area is red and close to the nipple like the episode that occurred prior to this one. Pt reports that 2 years ago she had a cyst to her left breast where she had to have a surgical I&D completed. Pt states that since then she has had a knot to the area. Pt is having associated symptoms of redness. She notes that she has not tried any medications for the relief of her symptoms. She denies fever, chills, and any other symptoms.    Past Medical History  Diagnosis Date  . Scabies    Past Surgical History  Procedure Laterality Date  . Incision and drainage abscess Left 09/24/2012    Procedure: INCISION AND DRAINAGE BREAST ABSCESS;  Surgeon: Robyne Askew, MD;  Location: [facility] OR;  Service: General;  Laterality: Left;   History reviewed. No pertinent family history. Social History  Substance Use Topics  . Smoking status: Current Every Day Smoker -- 0.50 packs/day    Types: Cigarettes  . Smokeless tobacco: None  . Alcohol Use: No   OB History    No data available     Review of Systems  Constitutional: Negative for fever and chills.  Skin: Positive for color change. Negative for wound.       Abscess to left breast above the nipple      Allergies  Pineapple  Home Medications   Prior to Admission medications   Medication Sig Start Date End Date Taking? Authorizing Provider  amoxicillin (AMOXIL) 500 MG capsule  Take 1 capsule (500 mg total) by mouth 3 (three) times daily. Patient not taking: Reported on 03/27/2014 02/16/14   Jaynie Crumble, PA-C  clindamycin (CLEOCIN) 150 MG capsule Take 2 capsules (300 mg total) by mouth 3 (three) times daily. May dispense as  capsules Patient not taking: Reported on 05/20/2014 03/27/14   Francee Piccolo, PA-C  HYDROcodone-acetaminophen (NORCO) 5-325 MG per tablet Take 1 tablet by mouth every 6 (six) hours as needed for moderate pain. Patient not taking: Reported on 03/27/2014 02/16/14   Tatyana Kirichenko, PA-C  ibuprofen (ADVIL,MOTRIN) 200 MG tablet Take 800 mg by mouth every 6 (six) hours as needed for headache, mild pain or moderate pain.     Historical Provider, MD  naproxen (NAPROSYN) 500 MG tablet Take 1 tablet (500 mg total) by mouth 2 (two) times daily. Patient not taking: Reported on 03/27/2014 06/04/13   Felicie Morn, NP  naproxen sodium (ALEVE) 220 MG tablet Take 660-1,320 mg by mouth every 12 (twelve) hours as needed (pain).     Historical Provider, MD  ondansetron (ZOFRAN ODT) 4 MG disintegrating tablet  ODT q4 hours prn nausea/vomit Patient not taking: Reported on 09/15/2014 05/20/14   Joycie Peek, PA-C  oxyCODONE-acetaminophen (PERCOCET/ROXICET) 5-325 MG per tablet Take 1 tablet by mouth every 4 (four) hours as needed for severe pain. May take 2 tablets PO q 6 hours  for severe pain - Do not take with Tylenol as this tablet already contains tylenol Patient not taking: Reported on 05/20/2014 03/27/14   Francee PiccoloJennifer Piepenbrink, PA-C  penicillin v potassium (VEETID) 500 MG tablet Take 1 tablet (500 mg total) by mouth 3 (three) times daily. 09/15/14   Margarita Grizzleanielle Ray, MD  penicillin v potassium (VEETID) 500 MG tablet Take 1 tablet (500 mg total) by mouth 3 (three) times daily. 09/15/14   Margarita Grizzleanielle Ray, MD   BP 139/81 mmHg  Pulse 83  Temp(Src) 98.1 F (36.7 C) (Oral)  Resp 16  SpO2 100%  LMP 03/07/2015 Physical Exam  Constitutional: She is oriented to person,  place, and time. She appears well-developed and well-nourished. No distress.  HENT:  Head: Normocephalic and atraumatic.  Eyes: EOM are normal.  Neck: Neck supple.  Cardiovascular: Normal rate.   Pulmonary/Chest: Effort normal. No respiratory distress.  Palpable 2 cm sub-areolar mass with slight surrounding erythema. See photo below.  Musculoskeletal: Normal range of motion.  Neurological: She is alert and oriented to person, place, and time.  Skin: Skin is warm and dry.  Psychiatric: She has a normal mood and affect. Her behavior is normal.  Nursing note and vitals reviewed.       ED Course  Procedures  DIAGNOSTIC STUDIES: Oxygen Saturation is 100% on RA, nl by my interpretation.    COORDINATION OF CARE: 1:26 PM Discussed treatment plan with pt at bedside which includes consult with attending and general surgery consult and pt agreed to plan.  2:02 PM- Consult with General surgery, PA-C, who recommends the pt to follow up at breast center   2:06 PM- [facility] with [facility] to acquire follow up for pt.  MDM   Final diagnoses:  Subareolar breast abscess   Patient nontoxic appearing, VSS. Patient with subareolar breast abscess. Patient without leukocytosis and appears stable at this time. Spoke with general surgery who advised [facility] follow-up ultrasound (scheduled through their IR department). Attempted coordinating follow-up appt but the Breast Center does not allow referrals from the ER, and as the patient as no PCP or gynecologist, patient was referred to [facility] to then obtain follow-up with Breast Center. Patient may be safely discharged home. Discussed reasons for return. Patient to follow-up with [facility] within one day. Patient in understanding and agreement with the plan.   I personally performed the services described in this documentation, which was scribed in my presence. The recorded information has been reviewed and is  accurate.   Melton KrebsSamantha Nicole Miyu Fenderson, PA-C 03/23/15 [phone]  Alvira MondayErin Schlossman, MD 03/24/15 [phone]

## 2015-03-28 ENCOUNTER — Other Ambulatory Visit (HOSPITAL_COMMUNITY): Payer: Self-pay | Admitting: *Deleted

## 2015-03-28 DIAGNOSIS — N6320 Unspecified lump in the left breast, unspecified quadrant: Secondary | ICD-10-CM

## 2015-03-31 ENCOUNTER — Encounter (HOSPITAL_COMMUNITY): Payer: Self-pay

## 2015-03-31 ENCOUNTER — Ambulatory Visit (HOSPITAL_COMMUNITY)
Admission: RE | Admit: 2015-03-31 | Discharge: 2015-03-31 | Disposition: A | Discharge status: Home / Self Care | Payer: Self-pay | Source: Ambulatory Visit | Attending: Obstetrics and Gynecology | Admitting: Obstetrics and Gynecology

## 2015-03-31 ENCOUNTER — Ambulatory Visit
Admission: RE | Admit: 2015-03-31 | Discharge: 2015-03-31 | Disposition: A | Discharge status: Home / Self Care | Payer: No Typology Code available for payment source | Source: Ambulatory Visit | Attending: Obstetrics and Gynecology | Admitting: Obstetrics and Gynecology

## 2015-03-31 VITALS — BP 102/64 | Temp 98.2°F | Ht 64.0 in | Wt 234.0 lb

## 2015-03-31 DIAGNOSIS — Z1239 Encounter for other screening for malignant neoplasm of breast: Secondary | ICD-10-CM

## 2015-03-31 DIAGNOSIS — N61 Mastitis without abscess: Secondary | ICD-10-CM

## 2015-03-31 DIAGNOSIS — N6320 Unspecified lump in the left breast, unspecified quadrant: Secondary | ICD-10-CM

## 2015-03-31 NOTE — Progress Notes (Signed)
Complaints of left breast lump x 3 years that had surgery 09/24/2012. Per patient it never completely resolved. Patient stated that beginning 03/21/2015 the area became red and painful. Patient went to the ED and was given the antibiotic clindamycin to treat. Patient states has seen some improvement but hasn't completely resolved. Patient stated she still has one more day of antibiotics remaining. Patient states that she is still having some pain that comes and goes. Patient rates pain at a 5 out of 10. Patient states there is still some redness but has improved.  Pap Smear: Pap smear not completed today. [facility] and normal per patient. Per patient has no history of an abnormal Pap smear. No Pap smear results in EPIC.  Physical exam: Breasts Breasts symmetrical. No skin abnormalities right breast. Left breast slightly red around scar above nipple between 11 o'clock and 1 o'clock that previous breast surgery was completed. No nipple retraction bilateral breasts. No nipple discharge bilateral breasts. No lymphadenopathy. No lumps palpated right breast. Palpated a firm area around 11 o'clock above nipple at edge of scar. Complaints of tenderness when palpated area above left nipple. [facility]. Appointment scheduled for Friday, March 31, 2015 at 1310.   Pelvic/Bimanual No Pap smear completed today since last Pap smear was in March 2015 per patient. Pap smear not indicated per [facility] guidelines.   Smoking History: Patient is a current smoker.Discussed smoking cessation with patient. Referred to the [facility] and gave resources to free smoking cessation classes offered at the [facility].   Patient Navigation: Patient education provided. Access to services provided for patient through [facility].

## 2015-03-31 NOTE — Patient Instructions (Signed)
Educational materials on breast self awareness given. Explained to Meta HatchetJacqueline Gilberti that she did not need a Pap smear today due to last Pap smear was in March 2015 per patient. Told patient about free cervical cancer screenings to receive a Pap smear next year when she is due. Let her know [facility] will cover Pap smears every 3 years unless has a history of abnormal Pap smears. [facility]. Appointment scheduled for Friday, March 31, 2015 at 1310. Patient aware of appointment and will be there. Discussed smoking cessation with patient. Referred to the [facility] and gave resources to free smoking cessation classes offered at the [facility]. Meta HatchetJacqueline [place] verbalized understanding.  Anne-Marie Genson, Kathaleen Maserhristine Poll, RN 11:41 AM

## 2015-04-12 ENCOUNTER — Encounter (HOSPITAL_COMMUNITY): Payer: Self-pay | Admitting: *Deleted

## 2015-04-15 DIAGNOSIS — N611 Abscess of the breast and nipple: Secondary | ICD-10-CM

## 2015-04-15 HISTORY — DX: Abscess of the breast and nipple: N61.1

## 2015-04-18 ENCOUNTER — Other Ambulatory Visit: Payer: Self-pay | Admitting: General Surgery

## 2015-04-22 ENCOUNTER — Encounter (HOSPITAL_COMMUNITY): Payer: Self-pay | Admitting: Nurse Practitioner

## 2015-04-22 ENCOUNTER — Emergency Department (HOSPITAL_COMMUNITY): Payer: Self-pay

## 2015-04-22 ENCOUNTER — Emergency Department (HOSPITAL_COMMUNITY)
Admission: EM | Admit: 2015-04-22 | Discharge: 2015-04-22 | Disposition: A | Discharge status: Home / Self Care | Payer: Self-pay | Attending: Emergency Medicine | Admitting: Emergency Medicine

## 2015-04-22 DIAGNOSIS — S92302A Fracture of unspecified metatarsal bone(s), left foot, initial encounter for closed fracture: Secondary | ICD-10-CM

## 2015-04-22 DIAGNOSIS — Y9339 Activity, other involving climbing, rappelling and jumping off: Secondary | ICD-10-CM | POA: Insufficient documentation

## 2015-04-22 DIAGNOSIS — Z8619 Personal history of other infectious and parasitic diseases: Secondary | ICD-10-CM | POA: Insufficient documentation

## 2015-04-22 DIAGNOSIS — S92352A Displaced fracture of fifth metatarsal bone, left foot, initial encounter for closed fracture: Secondary | ICD-10-CM | POA: Insufficient documentation

## 2015-04-22 DIAGNOSIS — F17210 Nicotine dependence, cigarettes, uncomplicated: Secondary | ICD-10-CM | POA: Insufficient documentation

## 2015-04-22 DIAGNOSIS — X58XXXA Exposure to other specified factors, initial encounter: Secondary | ICD-10-CM | POA: Insufficient documentation

## 2015-04-22 DIAGNOSIS — Y9289 Other specified places as the place of occurrence of the external cause: Secondary | ICD-10-CM | POA: Insufficient documentation

## 2015-04-22 DIAGNOSIS — Z792 Long term (current) use of antibiotics: Secondary | ICD-10-CM | POA: Insufficient documentation

## 2015-04-22 DIAGNOSIS — Y998 Other external cause status: Secondary | ICD-10-CM | POA: Insufficient documentation

## 2015-04-22 HISTORY — DX: Fracture of unspecified metatarsal bone(s), left foot, initial encounter for closed fracture: S92.302A

## 2015-04-22 MED ORDER — NAPROXEN 500 MG PO TABS: 500.0000 mg | ORAL_TABLET | Freq: Two times a day (BID) | ORAL | Status: DC

## 2015-04-22 MED ORDER — TRAMADOL HCL 50 MG PO TABS: 50.0000 mg | ORAL_TABLET | Freq: Four times a day (QID) | ORAL | Status: DC | PRN

## 2015-04-22 NOTE — ED Notes (Signed)
Pt c/o L foot pain since she stepped off a stool yesterday and heard a pop in it. Cms intact

## 2015-04-22 NOTE — Discharge Instructions (Signed)
Metatarsal Fracture °A metatarsal fracture is a break in a metatarsal bone. Metatarsal bones connect your toe bones to your ankle bones. °CAUSES °This type of fracture may be caused by: °· A sudden twisting of your foot. °· A fall onto your foot. °· Overuse or repetitive exercise. °RISK FACTORS °This condition is more likely to develop in people who: °· Play contact sports. °· Have a bone disease. °· Have a low calcium level. °SYMPTOMS °Symptoms of this condition include: °· Pain that is worse when walking or standing. °· Pain when pressing on the foot or moving the toes. °· Swelling. °· Bruising on the top or bottom of the foot. °· A foot that appears shorter than the other one. °DIAGNOSIS °This condition is diagnosed with a physical exam. You may also have imaging tests, such as: °· X-rays. °· A CT scan. °· MRI. °TREATMENT °Treatment for this condition depends on its severity and whether a bone has moved out of place. Treatment may involve: °· Rest. °· Wearing foot support such as a cast, splint, or boot for several weeks. °· Using crutches. °· Surgery to move bones back into the right position. Surgery is usually needed if there are many pieces of broken bone or bones that are very out of place (displaced fracture). °· Physical therapy. This may be needed to help you regain full movement and strength in your foot. °You will need to return to your health care provider to have X-rays taken until your bones heal. Your health care provider will look at the X-rays to make sure that your foot is healing well. °HOME CARE INSTRUCTIONS  °If You Have a Cast: °· Do not stick anything inside the cast to scratch your skin. Doing that increases your risk of infection. °· Check the skin around the cast every day. Report any concerns to your health care provider. You may put lotion on dry skin around the edges of the cast. Do not apply lotion to the skin underneath the cast. °· Keep the cast clean and dry. °If You Have a Splint  or a Supportive Boot: °· Wear it as directed by your health care provider. Remove it only as directed by your health care provider. °· Loosen it if your toes become numb and tingle, or if they turn cold and blue. °· Keep it clean and dry. °Bathing °· Do not take baths, swim, or use a hot tub until your health care provider approves. Ask your health care provider if you can take showers. You may only be allowed to take sponge baths for bathing. °· If your health care provider approves bathing and showering, cover the cast or splint with a watertight plastic bag to protect it from water. Do not let the cast or splint get wet. °Managing Pain, Stiffness, and Swelling °· If directed, apply ice to the injured area (if you have a splint, not a cast). °¨ Put ice in a plastic bag. °¨ Place a towel between your skin and the bag. °¨ Leave the ice on for 20 minutes, 2-3 times per day. °· Move your toes often to avoid stiffness and to lessen swelling. °· Raise (elevate) the injured area above the level of your heart while you are sitting or lying down. °Driving °· Do not drive or operate heavy machinery while taking pain medicine. °· Do not drive while wearing foot support on a foot that you use for driving. °Activity °· Return to your normal activities as directed by your health care   provider. Ask your health care provider what activities are safe for you. °· Perform exercises as directed by your health care provider or physical therapist. °Safety °· Do not use the injured foot to support your body weight until your health care provider says that you can. Use crutches as directed by your health care provider. °General Instructions °· Do not put pressure on any part of the cast or splint until it is fully hardened. This may take several hours. °· Do not use any tobacco products, including cigarettes, chewing tobacco, or e-cigarettes. Tobacco can delay bone healing. If you need help quitting, ask your health care  provider. °· Take medicines only as directed by your health care provider. °· Keep all follow-up visits as directed by your health care provider. This is important. °SEEK MEDICAL CARE IF: °· You have a fever. °· Your cast, splint, or boot is too loose or too tight. °· Your cast, splint, or boot is damaged. °· Your pain medicine is not helping. °· You have pain, tingling, or numbness in your foot that is not going away. °SEEK IMMEDIATE MEDICAL CARE IF: °· You have severe pain. °· You have tingling or numbness in your foot that is getting worse. °· Your foot feels cold or becomes numb. °· Your foot changes color. °  °This information is not intended to replace advice given to you by your health care provider. Make sure you discuss any questions you have with your health care provider. °  °Document Released: 09/22/2001 Document Revised: 05/17/2014 Document Reviewed: 10/27/2013 °Elsevier Interactive Patient Education ©2016 [employer] ° °RICE for Routine Care of Injuries °The routine care of many injuries includes rest, ice, compression, and elevation (RICE therapy). RICE therapy is often recommended for injuries to soft tissues, such as a muscle strain, ligament injuries, bruises, and overuse injuries. It can also be used for some bony injuries. Using RICE therapy can help to relieve pain, lessen swelling, and enable your body to heal. °Rest °Rest is required to allow your body to heal. This usually involves reducing your normal activities and avoiding use of the injured part of your body. Generally, you can return to your normal activities when you are comfortable and have been given permission by your health care provider. °Ice °Icing your injury helps to keep the swelling down, and it lessens pain. Do not apply ice directly to your skin. °· Put ice in a plastic bag. °· Place a towel between your skin and the bag. °· Leave the ice on for 20 minutes, 2-3 times a day. °Do this for as long as you are directed by your  health care provider. °Compression °Compression means putting pressure on the injured area. Compression helps to keep swelling down, gives support, and helps with discomfort. Compression may be done with an elastic bandage. If an elastic bandage has been applied, follow these general tips: °· Remove and reapply the bandage every 3-4 hours or as directed by your health care provider. °· Make sure the bandage is not wrapped too tightly, because this can cut off circulation. If part of your body beyond the bandage becomes blue, numb, cold, swollen, or more painful, your bandage is most likely too tight. If this occurs, remove your bandage and reapply it more loosely. °· See your health care provider if the bandage seems to be making your problems worse rather than better. °Elevation °Elevation means keeping the injured area raised. This helps to lessen swelling and decrease pain. If possible, your injured area should be elevated at   or above the level of your heart or the center of your chest. °WHEN SHOULD I SEEK MEDICAL CARE? °You should seek medical care if: °· Your pain and swelling continue. °· Your symptoms are getting worse rather than improving. °These symptoms may indicate that further evaluation or further X-rays are needed. Sometimes, X-rays may not show a small broken bone (fracture) until a number of days later. Make a follow-up appointment with your health care provider. °WHEN SHOULD I SEEK IMMEDIATE MEDICAL CARE? °You should seek immediate medical care if: °· You have sudden severe pain at or below the area of your injury. °· You have redness or increased swelling around your injury. °· You have tingling or numbness at or below the area of your injury that does not improve after you remove the elastic bandage. °  °This information is not intended to replace advice given to you by your health care provider. Make sure you discuss any questions you have with your health care provider. °  °Document Released:  04/14/2000 Document Revised: 09/21/2014 Document Reviewed: 12/08/2013 °Elsevier Interactive Patient Education ©2016 [employer] ° °

## 2015-04-22 NOTE — ED Provider Notes (Signed)
CSN: [id number]     Arrival date & time 04/22/15  1127 History  By signing my name below, I, Mary Davies, attest that this documentation has been prepared under the direction and in the presence of non-physician practitioner, Arthor CaptainAbigail Katai Marsico, PA-C. Electronically Signed: [name], Scribe. 04/22/2015. 12:11 PM.    Chief Complaint  Patient presents with  . Foot Pain   The history is provided by the patient. No language interpreter was used.    HPI Comments:  Mary Davies is a 25 y.o. female who presents to the Emergency Department complaining of constant left foot pain s/p injury yesterday ~ 1700 with associated swelling. Pt states she climbed onto a countertop and came down on the foot incorrectly; states she heard a crack.  Pt reports 10/10 pain when she ambulates. She has applied ice and taken ibuprofen with little relief. She denies numbness and weakness.   Past Medical History  Diagnosis Date  . Scabies    Past Surgical History  Procedure Laterality Date  . Incision and drainage abscess Left 09/24/2012    Procedure: INCISION AND DRAINAGE BREAST ABSCESS;  Surgeon: Robyne AskewPaul S Toth III, MD;  Location: [facility] OR;  Service: General;  Laterality: Left;   Family History  Problem Relation Age of Onset  . Diabetes Mother    Social History  Substance Use Topics  . Smoking status: Current Every Day Smoker -- 0.50 packs/day    Types: Cigarettes  . Smokeless tobacco: None  . Alcohol Use: No   OB History    Gravida Para Term Preterm AB TAB SAB Ectopic Multiple Living   1    1  1         Review of Systems  Musculoskeletal: Positive for myalgias and arthralgias.       Left foot   Neurological: Negative for weakness.    Allergies  Pineapple  Home Medications   Prior to Admission medications   Medication Sig Start Date End Date Taking? Authorizing Provider  amoxicillin (AMOXIL) 500 MG capsule Take 1 capsule (500 mg total) by mouth 3 (three) times daily. Patient not taking:  Reported on 03/27/2014 02/16/14   Jaynie Crumbleatyana Kirichenko, PA-C  clindamycin (CLEOCIN) 150 MG capsule Take 3 capsules (450 mg total) by mouth 3 (three) times daily. 03/21/15   Melton KrebsSamantha Nicole Riley, PA-C  HYDROcodone-acetaminophen (NORCO/VICODIN) 5-325 MG tablet Take 1-2 tablets by mouth every 6 (six) hours as needed. Patient not taking: Reported on 03/31/2015 03/21/15   Melton KrebsSamantha Nicole Riley, PA-C  ibuprofen (ADVIL,MOTRIN) 200 MG tablet Take 800 mg by mouth every 6 (six) hours as needed for headache, mild pain or moderate pain. Reported on 03/31/2015    Historical Provider, MD  naproxen (NAPROSYN) 500 MG tablet Take 1 tablet (500 mg total) by mouth 2 (two) times daily. Patient not taking: Reported on 03/27/2014 06/04/13   Felicie Mornavid Smith, NP  naproxen sodium (ALEVE) 220 MG tablet Take 660-1,320 mg by mouth every 12 (twelve) hours as needed (pain). Reported on 03/31/2015    Historical Provider, MD  ondansetron (ZOFRAN ODT) 4 MG disintegrating tablet 4mg  ODT q4 hours prn nausea/vomit Patient not taking: Reported on 09/15/2014 05/20/14   Joycie PeekBenjamin Cartner, PA-C  oxyCODONE-acetaminophen (PERCOCET/ROXICET) 5-325 MG per tablet Take 1 tablet by mouth every 4 (four) hours as needed for severe pain. May take 2 tablets PO q 6 hours for severe pain - Do not take with Tylenol as this tablet already contains tylenol Patient not taking: Reported on 05/20/2014 03/27/14   Francee PiccoloJennifer Piepenbrink, PA-C  penicillin v potassium (VEETID) 500 MG tablet Take 1 tablet (500 mg total) by mouth 3 (three) times daily. Patient not taking: Reported on 03/31/2015 09/15/14   Margarita Grizzle, MD  penicillin v potassium (VEETID) 500 MG tablet Take 1 tablet (500 mg total) by mouth 3 (three) times daily. Patient not taking: Reported on 03/31/2015 09/15/14   Margarita Grizzle, MD   BP 131/78 mmHg  Pulse 71  Temp(Src) 97.5 F (36.4 C) (Oral)  Resp 18  Ht  (1.626 m)  Wt 230 lb (104.327 kg)  BMI 39.46 kg/m2  SpO2 98%  LMP 03/24/2015 (Exact Date) Physical Exam   Constitutional: She is oriented to person, place, and time. She appears well-developed and well-nourished. No distress.  HENT:  Head: Normocephalic and atraumatic.  Eyes: Conjunctivae are normal.  Cardiovascular: Normal rate.   Pulmonary/Chest: Effort normal.  Abdominal: She exhibits no distension.  Musculoskeletal:  Edematous left foot Mildly erythematous exquistely tender to light palpation of the proximal lateral foot  Pt is able to wiggle toes with good sensation  No ecchymosis   Neurological: She is alert and oriented to person, place, and time.  Skin: Skin is warm and dry.  Psychiatric: She has a normal mood and affect.  Nursing note and vitals reviewed.   ED Course  Procedures   DIAGNOSTIC STUDIES:  Oxygen Saturation is 98% on RA, normal by my interpretation.    COORDINATION OF CARE:  11:43 AM Will order XR of left foot and ankle. Discussed treatment plan with pt at bedside and pt agreed to plan.   Imaging Review Dg Ankle Complete Left  04/22/2015  CLINICAL DATA:  Pain after trauma EXAM: LEFT ANKLE COMPLETE - 3+ VIEW COMPARISON:  None. FINDINGS: There is a fracture through the base of the fifth metatarsal L better seen on dedicated foot films. No other acute abnormalities. IMPRESSION: Fracture through the base of the fifth metatarsal. No ankle fracture. Electronically Signed   By: Gerome Sam III M.D   On: 04/22/2015 12:34   Dg Foot Complete Left  04/22/2015  CLINICAL DATA:  Pain after trauma EXAM: LEFT FOOT - COMPLETE 3+ VIEW COMPARISON:  None. FINDINGS: There is a nondisplaced fracture through the base of the fifth metatarsal. No other abnormalities. IMPRESSION: Nondisplaced fracture through the base of the fifth metatarsal. Electronically Signed   By: Gerome Sam III M.D   On: 04/22/2015 12:33   I have personally reviewed and evaluated these images and lab results as part of my medical decision-making.   MDM  Patient X-Ray shows nondisplaced fracture  through the base of the left fifth metatarsal.  Pt advised to follow up with orthopedics. Patient given postop shoe while in ED, conservative therapy recommended and discussed. Patient will be discharged home & is agreeable with above plan. Returns precautions discussed. Pt appears safe for discharge.  Final diagnoses:  Fracture of 5th metatarsal, left, closed, initial encounter      I personally performed the services described in this documentation, which was scribed in my presence. The recorded information has been reviewed and is accurate.       Arthor Captain, PA-C 04/22/15 1738  Richardean Canal, MD 04/22/15 [phone]

## 2015-04-22 NOTE — ED Notes (Signed)
Pt comfortable with discharge and follow up instructions. Pt declines wheelchair, escorted to waiting area by this RN. 

## 2015-05-01 ENCOUNTER — Encounter (HOSPITAL_BASED_OUTPATIENT_CLINIC_OR_DEPARTMENT_OTHER): Payer: Self-pay | Admitting: *Deleted

## 2015-05-08 ENCOUNTER — Ambulatory Visit (HOSPITAL_BASED_OUTPATIENT_CLINIC_OR_DEPARTMENT_OTHER): Payer: Self-pay | Admitting: Anesthesiology

## 2015-05-08 ENCOUNTER — Encounter (HOSPITAL_BASED_OUTPATIENT_CLINIC_OR_DEPARTMENT_OTHER)
Admission: RE | Disposition: A | Discharge status: Home / Self Care | Payer: Self-pay | Source: Ambulatory Visit | Attending: General Surgery

## 2015-05-08 ENCOUNTER — Encounter (HOSPITAL_BASED_OUTPATIENT_CLINIC_OR_DEPARTMENT_OTHER): Payer: Self-pay | Admitting: Anesthesiology

## 2015-05-08 ENCOUNTER — Ambulatory Visit (HOSPITAL_BASED_OUTPATIENT_CLINIC_OR_DEPARTMENT_OTHER)
Admission: RE | Admit: 2015-05-08 | Discharge: 2015-05-08 | Disposition: A | Discharge status: Home / Self Care | Payer: Self-pay | Source: Ambulatory Visit | Attending: General Surgery | Admitting: General Surgery

## 2015-05-08 DIAGNOSIS — Z6841 Body Mass Index (BMI) 40.0 and over, adult: Secondary | ICD-10-CM | POA: Insufficient documentation

## 2015-05-08 DIAGNOSIS — N611 Abscess of the breast and nipple: Secondary | ICD-10-CM | POA: Insufficient documentation

## 2015-05-08 DIAGNOSIS — F17200 Nicotine dependence, unspecified, uncomplicated: Secondary | ICD-10-CM | POA: Insufficient documentation

## 2015-05-08 HISTORY — PX: INCISION AND DRAINAGE ABSCESS: SHX5864

## 2015-05-08 HISTORY — DX: Unspecified asthma, uncomplicated: J45.909

## 2015-05-08 HISTORY — DX: Abscess of the breast and nipple: N61.1

## 2015-05-08 HISTORY — DX: Fracture of unspecified metatarsal bone(s), left foot, initial encounter for closed fracture: S92.302A

## 2015-05-08 SURGERY — INCISION AND DRAINAGE, ABSCESS
Anesthesia: General | Site: Breast | Laterality: Left

## 2015-05-08 MED ORDER — CHLORHEXIDINE GLUCONATE 4 % EX LIQD: 1.0000 "application " | Freq: Once | CUTANEOUS | Status: DC

## 2015-05-08 MED ORDER — PHENYLEPHRINE HCL 10 MG/ML IJ SOLN
INTRAMUSCULAR | Status: AC
  Filled 2015-05-08: qty 1

## 2015-05-08 MED ORDER — GLYCOPYRROLATE 0.2 MG/ML IJ SOLN
INTRAMUSCULAR | Status: AC
  Filled 2015-05-08: qty 1

## 2015-05-08 MED ORDER — ONDANSETRON HCL 4 MG/2ML IJ SOLN
INTRAMUSCULAR | Status: DC | PRN
  Administered 2015-05-08: 4 mg via INTRAVENOUS

## 2015-05-08 MED ORDER — HYDROMORPHONE HCL 1 MG/ML IJ SOLN
INTRAMUSCULAR | Status: AC
  Filled 2015-05-08: qty 1

## 2015-05-08 MED ORDER — LIDOCAINE HCL (CARDIAC) 20 MG/ML IV SOLN
INTRAVENOUS | Status: AC
  Filled 2015-05-08: qty 5

## 2015-05-08 MED ORDER — GLYCOPYRROLATE 0.2 MG/ML IJ SOLN: 0.2000 mg | Freq: Once | INTRAMUSCULAR | Status: DC | PRN

## 2015-05-08 MED ORDER — KETOROLAC TROMETHAMINE 30 MG/ML IJ SOLN: 30.0000 mg | Freq: Once | INTRAMUSCULAR | Status: DC | PRN

## 2015-05-08 MED ORDER — HYDROMORPHONE HCL 1 MG/ML IJ SOLN
0.2500 mg | INTRAMUSCULAR | Status: DC | PRN
  Administered 2015-05-08 (×2): 0.5 mg via INTRAVENOUS

## 2015-05-08 MED ORDER — OXYCODONE-ACETAMINOPHEN 5-325 MG PO TABS: 1.0000 | ORAL_TABLET | ORAL | Status: DC | PRN

## 2015-05-08 MED ORDER — DEXAMETHASONE SODIUM PHOSPHATE 4 MG/ML IJ SOLN
INTRAMUSCULAR | Status: DC | PRN
  Administered 2015-05-08: 10 mg via INTRAVENOUS

## 2015-05-08 MED ORDER — BUPIVACAINE-EPINEPHRINE 0.25% -1:200000 IJ SOLN
INTRAMUSCULAR | Status: DC | PRN
  Administered 2015-05-08: 10 mL

## 2015-05-08 MED ORDER — LACTATED RINGERS IV SOLN
INTRAVENOUS | Status: DC
  Administered 2015-05-08: 14:00:00 via INTRAVENOUS

## 2015-05-08 MED ORDER — FENTANYL CITRATE (PF) 100 MCG/2ML IJ SOLN
INTRAMUSCULAR | Status: AC
  Filled 2015-05-08: qty 2

## 2015-05-08 MED ORDER — PROMETHAZINE HCL 25 MG/ML IJ SOLN: 6.2500 mg | INTRAMUSCULAR | Status: DC | PRN

## 2015-05-08 MED ORDER — DEXAMETHASONE SODIUM PHOSPHATE 10 MG/ML IJ SOLN
INTRAMUSCULAR | Status: AC
  Filled 2015-05-08: qty 1

## 2015-05-08 MED ORDER — ATROPINE SULFATE 0.4 MG/ML IJ SOLN
INTRAMUSCULAR | Status: AC
  Filled 2015-05-08: qty 1

## 2015-05-08 MED ORDER — PROPOFOL 10 MG/ML IV BOLUS
INTRAVENOUS | Status: DC | PRN
  Administered 2015-05-08: 3 mg via INTRAVENOUS

## 2015-05-08 MED ORDER — FENTANYL CITRATE (PF) 100 MCG/2ML IJ SOLN
50.0000 ug | INTRAMUSCULAR | Status: DC | PRN
  Administered 2015-05-08: 50 ug via INTRAVENOUS
  Administered 2015-05-08: 100 ug via INTRAVENOUS

## 2015-05-08 MED ORDER — EPHEDRINE SULFATE 50 MG/ML IJ SOLN
INTRAMUSCULAR | Status: AC
  Filled 2015-05-08: qty 1

## 2015-05-08 MED ORDER — MIDAZOLAM HCL 2 MG/2ML IJ SOLN
1.0000 mg | INTRAMUSCULAR | Status: DC | PRN
  Administered 2015-05-08: 2 mg via INTRAVENOUS

## 2015-05-08 MED ORDER — ONDANSETRON HCL 4 MG/2ML IJ SOLN
INTRAMUSCULAR | Status: AC
  Filled 2015-05-08: qty 2

## 2015-05-08 MED ORDER — MIDAZOLAM HCL 2 MG/2ML IJ SOLN
INTRAMUSCULAR | Status: AC
  Filled 2015-05-08: qty 2

## 2015-05-08 MED ORDER — BUPIVACAINE-EPINEPHRINE (PF) 0.25% -1:200000 IJ SOLN
INTRAMUSCULAR | Status: AC
  Filled 2015-05-08: qty 30

## 2015-05-08 MED ORDER — CEFAZOLIN SODIUM-DEXTROSE 2-4 GM/100ML-% IV SOLN
INTRAVENOUS | Status: AC
  Filled 2015-05-08: qty 100

## 2015-05-08 MED ORDER — LIDOCAINE HCL (CARDIAC) 20 MG/ML IV SOLN
INTRAVENOUS | Status: DC | PRN
  Administered 2015-05-08: 50 mg via INTRAVENOUS

## 2015-05-08 MED ORDER — HYDROCODONE-ACETAMINOPHEN 7.5-325 MG PO TABS: 1.0000 | ORAL_TABLET | Freq: Once | ORAL | Status: DC | PRN

## 2015-05-08 MED ORDER — SCOPOLAMINE 1 MG/3DAYS TD PT72
1.0000 | MEDICATED_PATCH | Freq: Once | TRANSDERMAL | Status: DC | PRN
Start: 2015-05-08 — End: 2015-05-08

## 2015-05-08 MED ORDER — PROPOFOL 10 MG/ML IV BOLUS
INTRAVENOUS | Status: AC
  Filled 2015-05-08: qty 40

## 2015-05-08 MED ORDER — CEFAZOLIN SODIUM-DEXTROSE 2-4 GM/100ML-% IV SOLN
2.0000 g | INTRAVENOUS | Status: AC
  Administered 2015-05-08: 2 g via INTRAVENOUS

## 2015-05-08 MED ORDER — SUCCINYLCHOLINE CHLORIDE 20 MG/ML IJ SOLN
INTRAMUSCULAR | Status: AC
  Filled 2015-05-08: qty 1

## 2015-05-08 SURGICAL SUPPLY — 44 items
BENZOIN TINCTURE PRP APPL 2/3 (GAUZE/BANDAGES/DRESSINGS) ×6 IMPLANT
BLADE CLIPPER SURG (BLADE) IMPLANT
BLADE SURG 15 STRL LF DISP TIS (BLADE) ×1 IMPLANT
BLADE SURG 15 STRL SS (BLADE) ×2
BNDG GAUZE ELAST 4 BULKY (GAUZE/BANDAGES/DRESSINGS) IMPLANT
CANISTER SUCT 1200ML W/VALVE (MISCELLANEOUS) ×3 IMPLANT
CHLORAPREP W/TINT 26ML (MISCELLANEOUS) ×3 IMPLANT
CLEANER CAUTERY TIP 5X5 PAD (MISCELLANEOUS) ×1 IMPLANT
CLOSURE WOUND 1/2 X4 (GAUZE/BANDAGES/DRESSINGS)
COVER BACK TABLE 60X90IN (DRAPES) ×3 IMPLANT
COVER MAYO STAND STRL (DRAPES) ×3 IMPLANT
DECANTER SPIKE VIAL GLASS SM (MISCELLANEOUS) IMPLANT
DRAPE LAPAROTOMY T 102X78X121 (DRAPES) ×3 IMPLANT
DRAPE UTILITY XL STRL (DRAPES) ×3 IMPLANT
DRSG PAD ABDOMINAL 8X10 ST (GAUZE/BANDAGES/DRESSINGS) IMPLANT
ELECT REM PT RETURN 9FT ADLT (ELECTROSURGICAL) ×3
ELECTRODE REM PT RTRN 9FT ADLT (ELECTROSURGICAL) ×1 IMPLANT
GAUZE SPONGE 4X4 16PLY XRAY LF (GAUZE/BANDAGES/DRESSINGS) IMPLANT
GLOVE BIO SURGEON STRL SZ7.5 (GLOVE) ×3 IMPLANT
GLOVE BIOGEL M STRL SZ7.5 (GLOVE) ×3 IMPLANT
GLOVE BIOGEL PI IND STRL 8 (GLOVE) ×1 IMPLANT
GLOVE BIOGEL PI INDICATOR 8 (GLOVE) ×2
GOWN STRL REUS W/ TWL LRG LVL3 (GOWN DISPOSABLE) ×1 IMPLANT
GOWN STRL REUS W/TWL LRG LVL3 (GOWN DISPOSABLE) ×2
GOWN STRL REUS W/TWL XL LVL3 (GOWN DISPOSABLE) ×3 IMPLANT
LIQUID BAND (GAUZE/BANDAGES/DRESSINGS) ×3 IMPLANT
NEEDLE HYPO 25X1 1.5 SAFETY (NEEDLE) ×3 IMPLANT
NS IRRIG 1000ML POUR BTL (IV SOLUTION) IMPLANT
PACK BASIN DAY SURGERY FS (CUSTOM PROCEDURE TRAY) ×3 IMPLANT
PAD CLEANER CAUTERY TIP 5X5 (MISCELLANEOUS) ×2
PENCIL BUTTON HOLSTER BLD 10FT (ELECTRODE) ×3 IMPLANT
SPONGE GAUZE 4X4 12PLY STER LF (GAUZE/BANDAGES/DRESSINGS) ×6 IMPLANT
SPONGE LAP 18X18 X RAY DECT (DISPOSABLE) ×3 IMPLANT
STRIP CLOSURE SKIN 1/2X4 (GAUZE/BANDAGES/DRESSINGS) IMPLANT
SUT PROLENE 3 0 PS 2 (SUTURE) IMPLANT
SUT PROLENE 4 0 PS 2 18 (SUTURE) IMPLANT
SUT VICRYL 3-0 CR8 SH (SUTURE) ×3 IMPLANT
SYR CONTROL 10ML LL (SYRINGE) ×3 IMPLANT
TAPE CLOTH 3X10 TAN LF (GAUZE/BANDAGES/DRESSINGS) ×3 IMPLANT
TOWEL OR 17X24 6PK STRL BLUE (TOWEL DISPOSABLE) ×6 IMPLANT
TOWEL OR NON WOVEN STRL DISP B (DISPOSABLE) ×3 IMPLANT
TUBE CONNECTING 20'X1/4 (TUBING) ×1
TUBE CONNECTING 20X1/4 (TUBING) ×2 IMPLANT
YANKAUER SUCT BULB TIP NO VENT (SUCTIONS) ×3 IMPLANT

## 2015-05-08 NOTE — Interval H&P Note (Signed)
History and Physical Interval Note:  05/08/2015 2:51 PM  Mary Davies  has presented today for surgery, with the diagnosis of left breast abscess  The various methods of treatment have been discussed with the patient and family. After consideration of risks, benefits and other options for treatment, the patient has consented to  Procedure(s): INCISION AND DRAINAGE LEFT BREAST  ABSCESS (Left) as a surgical intervention .  The patient's history has been reviewed, patient examined, no change in status, stable for surgery.  I have reviewed the patient's chart and labs.  Questions were answered to the patient's satisfaction.     TOTH III,PAUL S

## 2015-05-08 NOTE — Anesthesia Preprocedure Evaluation (Addendum)
Anesthesia Evaluation  Patient identified by MRN, date of birth, ID band Patient awake    Reviewed: Allergy & Precautions, NPO status , Patient's Chart, lab work & pertinent test results  Airway Mallampati: II  TM Distance: >3 FB Neck ROM: Full    Dental  (+) Dental Advisory Given   Pulmonary asthma , Current Smoker,    breath sounds clear to auscultation       Cardiovascular negative cardio ROS   Rhythm:Regular Rate:Normal     Neuro/Psych negative neurological ROS     GI/Hepatic negative GI ROS, Neg liver ROS,   Endo/Other  Morbid obesity  Renal/GU negative Renal ROS     Musculoskeletal   Abdominal   Peds  Hematology negative hematology ROS (+)   Anesthesia Other Findings   Reproductive/Obstetrics                            Anesthesia Physical Anesthesia Plan  ASA: II  Anesthesia Plan: General   Post-op Pain Management:    Induction: Intravenous  Airway Management Planned: LMA  Additional Equipment:   Intra-op Plan:   Post-operative Plan: Extubation in OR  Informed Consent: I have reviewed the patients History and Physical, chart, labs and discussed the procedure including the risks, benefits and alternatives for the proposed anesthesia with the patient or authorized representative who has indicated his/her understanding and acceptance.   Dental advisory given  Plan Discussed with: CRNA  Anesthesia Plan Comments:         Anesthesia Quick Evaluation

## 2015-05-08 NOTE — Anesthesia Procedure Notes (Signed)
Procedure Name: LMA Insertion Date/Time: 05/08/2015 3:05 PM Performed by: Zenia ResidesPAYNE, Aysia Lowder D Pre-anesthesia Checklist: Patient identified, Emergency Drugs available, Suction available and Patient being monitored Patient Re-evaluated:Patient Re-evaluated prior to inductionOxygen Delivery Method: Circle System Utilized Preoxygenation: Pre-oxygenation with 100% oxygen Intubation Type: IV induction Ventilation: Mask ventilation without difficulty LMA: LMA inserted LMA Size: 4.0 Number of attempts: 1 Airway Equipment and Method: Bite block Placement Confirmation: positive ETCO2 Tube secured with: Tape Dental Injury: Teeth and Oropharynx as per pre-operative assessment

## 2015-05-08 NOTE — Discharge Instructions (Signed)

## 2015-05-08 NOTE — Anesthesia Postprocedure Evaluation (Signed)
Anesthesia Post Note  Patient: Mary Davies  Procedure(s) Performed: Procedure(s) (LRB): INCISION AND DRAINAGE LEFT BREAST  ABSCESS (Left)  Patient location during evaluation: PACU Anesthesia Type: General Level of consciousness: awake and alert Pain management: pain level controlled Vital Signs Assessment: post-procedure vital signs reviewed and stable Respiratory status: spontaneous breathing, nonlabored ventilation and respiratory function stable Cardiovascular status: blood pressure returned to baseline and stable Postop Assessment: no signs of nausea or vomiting Anesthetic complications: no    Last Vitals:  Filed Vitals:   05/08/15 1600 05/08/15 1615  BP: 103/77 119/71  Pulse: 80 88  Temp:    Resp: 19 18    Last Pain:  Filed Vitals:   05/08/15 1623  PainSc: 2                  Kennieth RadFitzgerald, Delonda Coley E

## 2015-05-08 NOTE — Op Note (Signed)
05/08/2015  3:31 PM  PATIENT:  Mary Davies  25 y.o. female  PRE-OPERATIVE DIAGNOSIS:  left breast abscess  POST-OPERATIVE DIAGNOSIS:  Left Breast Abcess chronic  PROCEDURE:  Procedure(s): Excision chronic left breast abscess  SURGEON:  Surgeon(s) and Role:    * Griselda MinerPaul Toth III, MD - Primary  PHYSICIAN ASSISTANT:   ASSISTANTS: none   ANESTHESIA:   general  EBL:     BLOOD ADMINISTERED:none  DRAINS: none   LOCAL MEDICATIONS USED:  MARCAINE     SPECIMEN:  Source of Specimen:  chronic left breast abscess  DISPOSITION OF SPECIMEN:  PATHOLOGY  COUNTS:  YES  TOURNIQUET:  * No tourniquets in log *  DICTATION: .Dragon Dictation   After informed consent was obtained the patient was brought to the operating room and placed in the supine position on the operating room table. After adequate induction of general anesthesia the patient's left breast was prepped with ChloraPrep, allowed to dry, and draped in usual sterile manner. An appropriate timeout was performed. There was a chronic abscess opening at the upper edge of the areola of the left breast. There was no cellulitis or acute infection apparent today. The area around this was infiltrated with quarter percent Marcaine. An elliptical incision was made In a radial fashion around the chronic opening. The incision was carried through the skin and subcutaneous tissue sharply with the electrocautery. The entire chronic abscess cavity was removed. It was then sent to pathology for further evaluation. The remaining breast tissue and skin and subcutaneous tissue appeared healthy with no sign of infection. Hemostasis was achieved using the Bovie electrocautery. The wound was irrigated with copious amounts of saline. The deep layer of the wound was then closed with layers of interrupted 3-0 Vicryl stitches. The skin was then closed with interrupted 4-0 Monocryl subcuticular stitches. Dermabond dressings were applied. The patient tolerated the  procedure well. At the end of the case all needle sponge and instrument counts were correct. The patient was then awakened and taken to recovery in stable condition.  PLAN OF CARE: Discharge to home after PACU  PATIENT DISPOSITION:  PACU - hemodynamically stable.   Delay start of Pharmacological VTE agent (>24hrs) due to surgical blood loss or risk of bleeding: not applicable

## 2015-05-08 NOTE — H&P (Signed)
**Note De-Identified via Davies** [name]  [facility] Patient #: [id number] DOB: 1990-11-01 Single / Language: [place] / Race: White Female   History of Present Illness  Patient words: breast f/u.  The patient is a 25 year old female who presents with a breast abscess. We are asked to see the patient in consultation by Dr. Metta Clinesatherine Lawrence to evaluate her for a left breast abscess. 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 It will intermittently become painful and draining. She had the cyst incised and drained in 2014 but it did not resolve. She denies any fevers or chills.   Diagnostic Studies History  Colonoscopy never Pap Smear 1-5 years ago  Allergies No Known Drug Allergies03/28/2017  Medication History  Norco (5-325MG  Tablet, Oral) Active. Ibuprofen (200MG  Tablet, Oral) Active. Naproxen (500MG  Tablet, Oral) Active. Naproxen Sodium (220MG  Tablet, Oral) Active. Percocet (5-325MG  Tablet, Oral) Active. Medications Reconciled  Family History Family history unknown First Degree Relatives  Pregnancy / Birth History Age at menarche 10 years. Regular periods    Review of Systems  General Not Present- Appetite Loss, Chills, Fatigue, Fever, Night Sweats, Weight Gain and Weight Loss. Skin Not Present- Change in Wart/Mole, Dryness, Hives, Jaundice, New Lesions, Non-Healing Wounds, Rash and Ulcer. HEENT Not Present- Earache, Hearing Loss, Hoarseness, Nose Bleed, Oral Ulcers, Ringing in the Ears, Seasonal Allergies, Sinus Pain, Sore Throat, Visual Disturbances, Wears glasses/contact lenses and Yellow Eyes. Respiratory Not Present- Bloody sputum, Chronic Cough, Difficulty Breathing, Snoring and Wheezing. Breast Not Present- Breast Mass, Breast Pain, Nipple Discharge and Skin Changes. Cardiovascular Not Present- Chest Pain, Difficulty Breathing Lying Down, Leg Cramps,  Palpitations, Rapid Heart Rate, Shortness of Breath and Swelling of Extremities. Gastrointestinal Not Present- Abdominal Pain, Bloating, Bloody Stool, Change in Bowel Habits, Chronic diarrhea, Constipation, Difficulty Swallowing, Excessive gas, Gets full quickly at meals, Hemorrhoids, Indigestion, Nausea, Rectal Pain and Vomiting. Female Genitourinary Not Present- Frequency, Nocturia, Painful Urination, Pelvic Pain and Urgency. Musculoskeletal Not Present- Back Pain, Joint Pain, Joint Stiffness, Muscle Pain, Muscle Weakness and Swelling of Extremities. Neurological Not Present- Decreased Memory, Fainting, Headaches, Numbness, Seizures, Tingling, Tremor, Trouble walking and Weakness. Psychiatric Not Present- Anxiety, Bipolar, Change in Sleep Pattern, Depression, Fearful and Frequent crying. Endocrine Not Present- Cold Intolerance, Excessive Hunger, Hair Changes, Heat Intolerance, Hot flashes and New Diabetes. Hematology Not Present- Easy Bruising, Excessive bleeding, Gland problems, HIV and Persistent Infections.  Vitals  Weight: 235 lb Height: 64in Body Surface Area: 2.09 m Body Mass Index: 40.34 kg/m  Temp.: 48F(Temporal)  Pulse: 76 (Regular)  BP: 130/80 (Sitting, Left Arm, Standard)       Physical Exam  General Mental Status-Alert. General Appearance-Consistent with stated age. Hydration-Well hydrated. Voice-Normal.  Head and Neck Head-normocephalic, atraumatic with no lesions or palpable masses. Trachea-midline. Thyroid Gland Characteristics - normal size and consistency.  Eye Eyeball - Bilateral-Extraocular movements intact. Sclera/Conjunctiva - Bilateral-No scleral icterus.  Chest and Lung Exam Chest and lung exam reveals -quiet, even and easy respiratory effort with no use of accessory muscles and on auscultation, normal breath sounds, no adventitious sounds and normal vocal resonance. Inspection Chest Wall - Normal. Back -  normal.  Breast Note: There is a chronic appearing draining sinus at the 12 o'clock position of the left areolar with some induration. There is no cellulitis today. There is no palpable mass of the right breast. There is no palpable axillary, supraclavicular, or cervical lymphadenopathy.  Cardiovascular Cardiovascular examination reveals -normal heart sounds, regular rate and rhythm with no murmurs and normal pedal pulses bilaterally.  Abdomen Inspection Inspection of the abdomen reveals - No Hernias. Skin - Scar - no surgical scars. Palpation/Percussion Palpation and Percussion of the abdomen reveal - Soft, Non Tender, No Rebound tenderness, No Rigidity (guarding) and No hepatosplenomegaly. Auscultation Auscultation of the abdomen reveals - Bowel sounds normal.  Neurologic Neurologic evaluation reveals -alert and oriented x 3 with no impairment of recent or remote memory. Mental Status-Normal.  Musculoskeletal Normal Exam - Left-Upper Extremity Strength Normal and Lower Extremity Strength Normal. Normal Exam - Right-Upper Extremity Strength Normal and Lower Extremity Strength Normal.  Lymphatic Head & Neck  General Head & Neck Lymphatics: Bilateral - Description - Normal. Axillary  General Axillary Region: Bilateral - Description - Normal. Tenderness - Non Tender. Femoral & Inguinal  Generalized Femoral & Inguinal Lymphatics: Bilateral - Description - Normal. Tenderness - Non Tender.    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 Because of the risk of recurrent infections I think she would benefit from having this area excised. I have discussed with her in detail the risks and benefits of the operation to do this as well as some of the technical aspects including the possibility of leaving the wound open and she understands and wishes to proceed    Signed by Caleen Essex, MD

## 2015-05-08 NOTE — Transfer of Care (Signed)
Immediate Anesthesia Transfer of Care Note  Patient: Meta HatchetJacqueline Jaco  Procedure(s) Performed: Procedure(s): INCISION AND DRAINAGE LEFT BREAST  ABSCESS (Left)  Patient Location: PACU  Anesthesia Type:General  Level of Consciousness: sedated  Airway & Oxygen Therapy: Patient Spontanous Breathing and Patient connected to face mask oxygen  Post-op Assessment: Report given to RN and Post -op Vital signs reviewed and stable  Post vital signs: Reviewed and stable  Last Vitals:  Filed Vitals:   05/08/15 1326  BP: 122/84  Pulse: 66  Temp: 36.8 C  Resp: 18    Complications: No apparent anesthesia complications

## 2015-05-09 ENCOUNTER — Encounter (HOSPITAL_BASED_OUTPATIENT_CLINIC_OR_DEPARTMENT_OTHER): Payer: Self-pay | Admitting: General Surgery

## 2015-08-30 ENCOUNTER — Telehealth (HOSPITAL_COMMUNITY): Payer: Self-pay | Admitting: *Deleted

## 2015-08-30 NOTE — Telephone Encounter (Signed)
Telephoned patient at home number and left message to return call to [facility] 

## 2015-10-25 ENCOUNTER — Encounter (HOSPITAL_COMMUNITY): Payer: Self-pay

## 2015-10-25 ENCOUNTER — Emergency Department (HOSPITAL_COMMUNITY): Payer: No Typology Code available for payment source

## 2015-10-25 ENCOUNTER — Emergency Department (HOSPITAL_COMMUNITY)
Admission: EM | Admit: 2015-10-25 | Discharge: 2015-10-25 | Disposition: A | Discharge status: Home / Self Care | Payer: No Typology Code available for payment source | Attending: Emergency Medicine | Admitting: Emergency Medicine

## 2015-10-25 DIAGNOSIS — J45909 Unspecified asthma, uncomplicated: Secondary | ICD-10-CM | POA: Insufficient documentation

## 2015-10-25 DIAGNOSIS — J069 Acute upper respiratory infection, unspecified: Secondary | ICD-10-CM

## 2015-10-25 DIAGNOSIS — B9789 Other viral agents as the cause of diseases classified elsewhere: Secondary | ICD-10-CM

## 2015-10-25 DIAGNOSIS — F17210 Nicotine dependence, cigarettes, uncomplicated: Secondary | ICD-10-CM | POA: Insufficient documentation

## 2015-10-25 LAB — POC URINE PREG, ED: Preg Test, Ur: NEGATIVE

## 2015-10-25 MED ORDER — BENZONATATE 100 MG PO CAPS: 100.0000 mg | ORAL_CAPSULE | Freq: Three times a day (TID) | ORAL | 0 refills | Status: DC

## 2015-10-25 MED ORDER — PREDNISONE 20 MG PO TABS
60.0000 mg | ORAL_TABLET | Freq: Once | ORAL | Status: AC
  Administered 2015-10-25: 60 mg via ORAL
  Filled 2015-10-25: qty 3

## 2015-10-25 MED ORDER — IPRATROPIUM-ALBUTEROL 0.5-2.5 (3) MG/3ML IN SOLN
3.0000 mL | Freq: Once | RESPIRATORY_TRACT | Status: AC
  Administered 2015-10-25: 3 mL via RESPIRATORY_TRACT
  Filled 2015-10-25: qty 3

## 2015-10-25 MED ORDER — ALBUTEROL SULFATE HFA 108 (90 BASE) MCG/ACT IN AERS
1.0000 | INHALATION_SPRAY | Freq: Once | RESPIRATORY_TRACT | Status: AC
  Administered 2015-10-25: 1 via RESPIRATORY_TRACT
  Filled 2015-10-25: qty 6.7

## 2015-10-25 MED ORDER — PREDNISONE 50 MG PO TABS: ORAL_TABLET | ORAL | 0 refills | Status: DC

## 2015-10-25 NOTE — ED Provider Notes (Signed)
[facility]-EMERGENCY DEPT Provider Note   CSN: [id number] Arrival date & time: 10/25/15  1926   By signing my name below, I, Teofilo Pod, attest that this documentation has been prepared under the direction and in the presence of Judie Hollick, PA-C. Electronically Signed: Teofilo Pod, ED Scribe. 10/25/2015. 8:35 PM.   History   Chief Complaint Chief Complaint  Patient presents with  . Cough   The history is provided by the patient. No language interpreter was used.   HPI Comments:  Mary Davies is a 25 y.o. female with PMHx of asthma who presents to the Emergency Department complaining of a worsening productive cough x 1 week. Pt reports a clear sputum when coughing. Pt reports associated sore throat, rhinorrhea, wheezing, and diarrhea. Pt reports associated SOB, difficulty speaking, difficulty sleeping. Pt is a smoker. No alleviating factors noted. Pt denies fever, ear pain.  Past Medical History:  Diagnosis Date  . Asthma    prn inhaler  . Fracture of metatarsal bone of left foot 04/22/2015   5th metatarsal  . Left breast abscess 04/2015    Patient Active Problem List   Diagnosis Date Noted  . Left breast abscess 09/24/2012  . Cellulitis 07/14/2012  . Tobacco abuse 07/14/2012    Past Surgical History:  Procedure Laterality Date  . INCISION AND DRAINAGE ABSCESS Left 09/24/2012   Procedure: INCISION AND DRAINAGE BREAST ABSCESS;  Surgeon: Robyne Askew, MD;  Location: [facility] OR;  Service: General;  Laterality: Left;  . INCISION AND DRAINAGE ABSCESS Left 05/08/2015   Procedure: INCISION AND DRAINAGE LEFT BREAST  ABSCESS;  Surgeon: Chevis Pretty III, MD;  Location: [facility];  Service: General;  Laterality: Left;    OB History    Gravida Para Term Preterm AB Living   1       1     SAB TAB Ectopic Multiple Live Births   1               Home Medications    Prior to Admission medications   Medication Sig Start Date End Date Taking?  Authorizing Provider  albuterol (PROVENTIL HFA;VENTOLIN HFA) 108 (90 Base) MCG/ACT inhaler Inhale into the lungs every 6 (six) hours as needed for wheezing or shortness of breath.    Historical Provider, MD  oxyCODONE-acetaminophen (ROXICET) 5-325 MG tablet Take 1-2 tablets by mouth every 4 (four) hours as needed. 05/08/15   Griselda Miner, MD    Family History Family History  Problem Relation Age of Onset  . Diabetes Mother     Social History Social History  Substance Use Topics  . Smoking status: Current Every Day Smoker    Packs/day: 0.50    Years: 10.00    Types: Cigarettes  . Smokeless tobacco: Never Used  . Alcohol use No     Allergies   Pineapple   Review of Systems Review of Systems  Constitutional: Negative for fever.  HENT: Negative for ear pain.   All other systems reviewed and are negative.    Physical Exam Updated Vital Signs BP 138/81 (BP Location: Right Arm)   Pulse 79   Temp 98.9 F (37.2 C) (Oral)   Resp 20   Ht 5\' 4"  (1.626 m)   Wt 224 lb 8 oz (101.8 kg)   LMP 10/23/2015 (Exact Date)   SpO2 97%   BMI 38.54 kg/m   Physical Exam  Constitutional: She is oriented to person, place, and time. She appears well-developed and well-nourished.  No distress.  HENT:  Head: Normocephalic and atraumatic.  Mouth/Throat: No oropharyngeal exudate.  Hoarse voice   Eyes: Conjunctivae and EOM are normal. Pupils are equal, round, and reactive to light. Right eye exhibits no discharge. Left eye exhibits no discharge. No scleral icterus.  Neck: Neck supple.  Cardiovascular: Normal rate, regular rhythm, normal heart sounds and intact distal pulses.   Pulmonary/Chest: Effort normal. She has wheezes ( mild expiratory wheezes in b/l lower lung fields).  Neurological: She is alert and oriented to person, place, and time. Coordination normal.  Skin: Skin is warm and dry. No rash noted. She is not diaphoretic. No erythema. No pallor.  Psychiatric: She has a normal mood  and affect. Her behavior is normal.  Nursing note and vitals reviewed.    ED Treatments / Results  DIAGNOSTIC STUDIES:  Oxygen Saturation is 97% on RA, normal by my interpretation.    COORDINATION OF CARE:  8:35 PM Discussed treatment plan with pt at bedside and pt agreed to plan.   Labs (all labs ordered are listed, but only abnormal results are displayed) Labs Reviewed  POC URINE PREG, ED    EKG  EKG Interpretation None       Radiology Dg Chest 2 View  Result Date: 10/25/2015 CLINICAL DATA:  Shortness of breath and abnormal breath sounds EXAM: CHEST  2 VIEW COMPARISON:  08/31/2007 FINDINGS: The heart size and mediastinal contours are within normal limits. Both lungs are clear. The visualized skeletal structures are unremarkable. IMPRESSION: No active cardiopulmonary disease. Electronically Signed   By: Alcide CleverMark  Lukens M.D.   On: 10/25/2015 21:29    Procedures Procedures (including critical care time)  Medications Ordered in ED Medications - No data to display   Initial Impression / Assessment and Plan / ED Course  I have reviewed the triage vital signs and the nursing notes.  Pertinent labs & imaging results that were available during my care of the patient were reviewed by me and considered in my medical decision making (see chart for details).  Clinical Course    Pt symptoms consistent with URI, likely viral in etiologyCXR negative for acute infiltrate. Mild wheezes heard on exam, duoneb given with significant symptomatic improvement. O2 sat 100% on RA. Repeat lung exam much improved. Pt is a smoker.  Pt will be discharged with prednisone burst, albuterol inhaler and cough medication. Encourage smoking cessation. Discussed return precautions.  Pt is hemodynamically stable & in NAD prior to discharge.   Final Clinical Impressions(s) / ED Diagnoses   Final diagnoses:  Viral URI with cough    New Prescriptions New Prescriptions   No medications on file  I  personally performed the services described in this documentation, which was scribed in my presence. The recorded information has been reviewed and is accurate.     Lester KinsmanSamantha Tripp PaxicoDowless, PA-C 10/26/15 0028    Arby BarretteMarcy Pfeiffer, MD 10/26/15 [phone]

## 2015-10-25 NOTE — ED Triage Notes (Signed)
Pt states she has had productive cough for about a week; pt states she lost voice about 3 days ago due to cough; pt denies pain on arrival; Pt does not appear to be in acute distress on arrival; pt a&ox 4 and speaking in full and complete sentences

## 2015-10-25 NOTE — Discharge Instructions (Signed)
Take steroids as prescribed. Take cough medication as needed. Use albuterol inhaler as needed. Encouraged smoking cessation. Follow up with her primary care doctor for symptoms do not improve. Return to the ED if you experience severe worsening of her symptoms, fevers, difficulty breathing or swallowing, chest pain.

## 2015-11-26 ENCOUNTER — Encounter (HOSPITAL_COMMUNITY): Payer: Self-pay

## 2015-11-26 ENCOUNTER — Emergency Department (HOSPITAL_COMMUNITY)
Admission: EM | Admit: 2015-11-26 | Discharge: 2015-11-26 | Disposition: A | Discharge status: Home / Self Care | Payer: No Typology Code available for payment source | Attending: Emergency Medicine | Admitting: Emergency Medicine

## 2015-11-26 DIAGNOSIS — J9801 Acute bronchospasm: Secondary | ICD-10-CM | POA: Insufficient documentation

## 2015-11-26 DIAGNOSIS — J069 Acute upper respiratory infection, unspecified: Secondary | ICD-10-CM

## 2015-11-26 DIAGNOSIS — F17210 Nicotine dependence, cigarettes, uncomplicated: Secondary | ICD-10-CM | POA: Insufficient documentation

## 2015-11-26 MED ORDER — PREDNISONE 10 MG (21) PO TBPK: 10.0000 mg | ORAL_TABLET | Freq: Every day | ORAL | 0 refills | Status: DC

## 2015-11-26 NOTE — ED Provider Notes (Signed)
[facility] Provider Note    By signing my name below, I, [name], attest that this documentation has been prepared under the direction and in the presence of Arthor CaptainAbigail Drea Jurewicz, PA-C. Electronically Signed: [name], ED Scribe. 11/26/15. Marland Kitchen.   History   Chief Complaint Chief Complaint  Patient presents with  . Cough  . Nasal Congestion   The history is provided by the patient and medical records. No language interpreter was used.    HPI Comments:  Mary Davies is an obese 25 y.o. female with PMHx of asthma who presents to the Emergency Department complaining of congestion that has been ongoing for the past two weeks. She reports associated sore throat and productive cough of mucous. She states she was seen here a couple weeks ago and was prescribed steroids that she never had filled and lost the prescription. She has been using her MDI with minimal relief of the symptoms. She denies modifying factors. She denies fever, chills, nausea, vomiting. She is a smoker.   Past Medical History:  Diagnosis Date  . Asthma    prn inhaler  . Fracture of metatarsal bone of left foot 04/22/2015   5th metatarsal  . Left breast abscess 04/2015    Patient Active Problem List   Diagnosis Date Noted  . Left breast abscess 09/24/2012  . Cellulitis 07/14/2012  . Tobacco abuse 07/14/2012    Past Surgical History:  Procedure Laterality Date  . INCISION AND DRAINAGE ABSCESS Left 09/24/2012   Procedure: INCISION AND DRAINAGE BREAST ABSCESS;  Surgeon: Robyne AskewPaul S Toth III, MD;  Location: [facility] OR;  Service: General;  Laterality: Left;  . INCISION AND DRAINAGE ABSCESS Left 05/08/2015   Procedure: INCISION AND DRAINAGE LEFT BREAST  ABSCESS;  Surgeon: Chevis PrettyPaul Toth III, MD;  Location: [facility];  Service: General;  Laterality: Left;    OB History    Gravida Para Term Preterm AB Living   1       1     SAB TAB Ectopic Multiple Live Births   1               Home  Medications    Prior to Admission medications   Medication Sig Start Date End Date Taking? Authorizing Provider  albuterol (PROVENTIL HFA;VENTOLIN HFA) 108 (90 Base) MCG/ACT inhaler Inhale into the lungs every 6 (six) hours as needed for wheezing or shortness of breath.    Historical Provider, MD  benzonatate (TESSALON) 100 MG capsule Take 1 capsule (100 mg total) by mouth every 8 (eight) hours. 10/25/15   Samantha Tripp Dowless, PA-C  oxyCODONE-acetaminophen (ROXICET) 5-325 MG tablet Take 1-2 tablets by mouth every 4 (four) hours as needed. 05/08/15   Chevis PrettyPaul Toth III, MD  predniSONE (DELTASONE) 50 MG tablet Take one daily x 5 days 10/25/15   Dub MikesSamantha Tripp Dowless, PA-C    Family History Family History  Problem Relation Age of Onset  . Diabetes Mother     Social History Social History  Substance Use Topics  . Smoking status: Current Every Day Smoker    Packs/day: 0.50    Years: 10.00    Types: Cigarettes  . Smokeless tobacco: Never Used  . Alcohol use No     Allergies   Pineapple   Review of Systems Review of Systems  Constitutional: Negative for chills and fever.  HENT: Positive for congestion and sore throat.   Respiratory: Positive for cough.   Gastrointestinal: Negative for nausea and vomiting.     Physical  Exam Updated Vital Signs BP 91/73 (BP Location: Right Arm)   Pulse 77   Temp 98 F (36.7 C) (Oral)   Resp 18   Ht 5\' 4"  (1.626 m)   Wt 220 lb (99.8 kg)   SpO2 99%   BMI 37.76 kg/m   Physical Exam  Constitutional: She is oriented to person, place, and time. She appears well-developed and well-nourished.  HENT:  Head: Normocephalic and atraumatic.  Cobblestoning and erythema of the oropharynx consistent with post nasal drip.  Neck: Normal range of motion.  Cardiovascular: Normal rate, regular rhythm and normal heart sounds.  Exam reveals no gallop and no friction rub.   No murmur heard. Pulmonary/Chest: Effort normal and breath sounds normal. No  respiratory distress. She has no wheezes. She has no rales.  Musculoskeletal: Normal range of motion.  Neurological: She is alert and oriented to person, place, and time.  Skin: Skin is warm and dry.  Psychiatric: She has a normal mood and affect. Her behavior is normal.  Nursing note and vitals reviewed.    ED Treatments / Results  DIAGNOSTIC STUDIES: Oxygen Saturation is 99% on RA, normal by my interpretation.   COORDINATION OF CARE: 2:22 PM- Will give prescription for steroid and work note. Pt verbalizes understanding and agrees to plan.  Medications - No data to display  Labs (all labs ordered are listed, but only abnormal results are displayed) Labs Reviewed - No data to display  EKG  EKG Interpretation None       Radiology No results found.  Procedures Procedures (including critical care time)  Medications Ordered in ED Medications - No data to display   Initial Impression / Assessment and Plan / ED Course  I have reviewed the triage vital signs and the nursing notes.  Pertinent labs & imaging results that were available during my care of the patient were reviewed by me and considered in my medical decision making (see chart for details).  Clinical Course     Pt symptoms consistent with URI.  Pt will be discharged with symptomatic treatment.  Discussed return precautions.  Pt is hemodynamically stable & in NAD prior to discharge.  I personally performed the services described in this documentation, which was scribed in my presence. The recorded information has been reviewed and is accurate.    Final Clinical Impressions(s) / ED Diagnoses   Final diagnoses:  Upper respiratory tract infection, unspecified type  Bronchospasm    New Prescriptions New Prescriptions   No medications on file     Arthor Captainbigail Seaver Machia, PA-C 11/26/15 1605    Melene Planan Floyd, DO 11/26/15 1805

## 2015-11-26 NOTE — Discharge Instructions (Signed)
You appear to have an upper respiratory infection (URI). An upper respiratory tract infection, or cold, is a viral infection of the air passages leading to the lungs. It is contagious and can be spread to others, especially during the first 3 or 4 days. It cannot be cured by antibiotics or other medicines. °RETURN IMMEDIATELY IF you develop shortness of breath, confusion or altered mental status, a new rash, become dizzy, faint, or poorly responsive, or are unable to be cared for at home. ° °

## 2015-11-26 NOTE — ED Triage Notes (Signed)
Patient here with ongoing cough and congestion.  Used the inhaler that she was prescribed but never got the steroids filled. Patient in no distress, smoker

## 2016-01-02 ENCOUNTER — Encounter (HOSPITAL_COMMUNITY): Payer: Self-pay | Admitting: *Deleted

## 2016-01-02 ENCOUNTER — Emergency Department (HOSPITAL_COMMUNITY)
Admission: EM | Admit: 2016-01-02 | Discharge: 2016-01-02 | Disposition: A | Discharge status: Home / Self Care | Payer: No Typology Code available for payment source | Attending: Emergency Medicine | Admitting: Emergency Medicine

## 2016-01-02 DIAGNOSIS — R112 Nausea with vomiting, unspecified: Secondary | ICD-10-CM

## 2016-01-02 DIAGNOSIS — J45909 Unspecified asthma, uncomplicated: Secondary | ICD-10-CM | POA: Insufficient documentation

## 2016-01-02 DIAGNOSIS — K529 Noninfective gastroenteritis and colitis, unspecified: Secondary | ICD-10-CM

## 2016-01-02 DIAGNOSIS — R197 Diarrhea, unspecified: Secondary | ICD-10-CM

## 2016-01-02 DIAGNOSIS — F17210 Nicotine dependence, cigarettes, uncomplicated: Secondary | ICD-10-CM | POA: Insufficient documentation

## 2016-01-02 DIAGNOSIS — Z79899 Other long term (current) drug therapy: Secondary | ICD-10-CM | POA: Insufficient documentation

## 2016-01-02 LAB — CBC WITH DIFFERENTIAL/PLATELET
BASOS PCT: 0 %
Basophils Absolute: 0 10*3/uL (ref 0.0–0.1)
EOS ABS: 0.3 10*3/uL (ref 0.0–0.7)
Eosinophils Relative: 4 %
HEMATOCRIT: 41.6 % (ref 36.0–46.0)
HEMOGLOBIN: 13.8 g/dL (ref 12.0–15.0)
Lymphocytes Relative: 35 %
Lymphs Abs: 2.7 10*3/uL (ref 0.7–4.0)
MCH: 31.7 pg (ref 26.0–34.0)
MCHC: 33.2 g/dL (ref 30.0–36.0)
MCV: 95.4 fL (ref 78.0–100.0)
Monocytes Absolute: 0.7 10*3/uL (ref 0.1–1.0)
Monocytes Relative: 9 %
NEUTROS ABS: 3.9 10*3/uL (ref 1.7–7.7)
Neutrophils Relative %: 52 %
Platelets: 313 10*3/uL (ref 150–400)
RBC: 4.36 MIL/uL (ref 3.87–5.11)
RDW: 13.5 % (ref 11.5–15.5)
WBC: 7.6 10*3/uL (ref 4.0–10.5)

## 2016-01-02 LAB — COMPREHENSIVE METABOLIC PANEL
ALBUMIN: 3.8 g/dL (ref 3.5–5.0)
ALT: 15 U/L (ref 14–54)
AST: 18 U/L (ref 15–41)
Alkaline Phosphatase: 64 U/L (ref 38–126)
Anion gap: 6 (ref 5–15)
BILIRUBIN TOTAL: 0.4 mg/dL (ref 0.3–1.2)
BUN: 15 mg/dL (ref 6–20)
CALCIUM: 9 mg/dL (ref 8.9–10.3)
CO2: 24 mmol/L (ref 22–32)
Chloride: 109 mmol/L (ref 101–111)
Creatinine, Ser: 1.08 mg/dL — ABNORMAL HIGH (ref 0.44–1.00)
GFR calc non Af Amer: >60 mL/min (ref >60)
GLUCOSE: 91 mg/dL (ref 65–99)
POTASSIUM: 4.3 mmol/L (ref 3.5–5.1)
Sodium: 139 mmol/L (ref 135–145)
TOTAL PROTEIN: 6.3 g/dL — AB (ref 6.5–8.1)

## 2016-01-02 LAB — URINALYSIS, ROUTINE W REFLEX MICROSCOPIC
Bilirubin Urine: NEGATIVE
Glucose, UA: NEGATIVE mg/dL
HGB URINE DIPSTICK: NEGATIVE
Ketones, ur: NEGATIVE mg/dL
Leukocytes, UA: NEGATIVE
NITRITE: NEGATIVE
PROTEIN: NEGATIVE mg/dL
SPECIFIC GRAVITY, URINE: 1.02 (ref 1.005–1.030)
pH: 6 (ref 5.0–8.0)

## 2016-01-02 LAB — PREGNANCY, URINE: PREG TEST UR: NEGATIVE

## 2016-01-02 LAB — LIPASE, BLOOD: LIPASE: 23 U/L (ref 11–51)

## 2016-01-02 MED ORDER — METOCLOPRAMIDE HCL 5 MG/ML IJ SOLN
10.0000 mg | Freq: Once | INTRAMUSCULAR | Status: AC
  Administered 2016-01-02: 10 mg via INTRAVENOUS
  Filled 2016-01-02: qty 2

## 2016-01-02 MED ORDER — SODIUM CHLORIDE 0.9 % IV BOLUS (SEPSIS)
1000.0000 mL | Freq: Once | INTRAVENOUS | Status: AC
  Administered 2016-01-02: 1000 mL via INTRAVENOUS

## 2016-01-02 MED ORDER — ONDANSETRON HCL 4 MG/2ML IJ SOLN
4.0000 mg | Freq: Once | INTRAMUSCULAR | Status: AC
  Administered 2016-01-02: 4 mg via INTRAVENOUS
  Filled 2016-01-02: qty 2

## 2016-01-02 MED ORDER — METOCLOPRAMIDE HCL 10 MG PO TABS: 10.0000 mg | ORAL_TABLET | Freq: Four times a day (QID) | ORAL | 0 refills | Status: DC

## 2016-01-02 MED ORDER — SODIUM CHLORIDE 0.9 % IV SOLN
INTRAVENOUS | Status: DC
  Administered 2016-01-02: 125 mL/h via INTRAVENOUS

## 2016-01-02 NOTE — ED Provider Notes (Signed)
[facility] Provider Note   CSN: [id number] Arrival date & time: 01/02/16  1021     History   Chief Complaint Chief Complaint  Patient presents with  . Emesis  . Diarrhea    HPI [name] Hejl is a 25 y.o. female.  HPI [name] is a 25 y.o. female with PMH significant for asthma and tobacco abuse who presents with 1 day history of gradual onset, constant, mild epigastric discomfort accompanied with N/V/D.  She denies abdominal pain, and points to her epigastrium and states it just feels like she needs to vomit.  She describes it as "uneasiness".  No fever, chills, CP, SOB, bloody stools, hematemesis, or urinary symptoms.  She reports 2 episodes of emesis last night, and has been able to tolerate fluids this morning.  She reports about 6-7 episodes of diarrhea.  She has not taken anything for her symptoms.  No sick contacts with similar symptoms.  No prior abdominal surgeries.  Denies drug or alcohol use.  No recent travel out of the [place].  She states this seems to happen every couple of months.    Past Medical History:  Diagnosis Date  . Asthma    prn inhaler  . Fracture of metatarsal bone of left foot 04/22/2015   5th metatarsal  . Left breast abscess 04/2015    Patient Active Problem List   Diagnosis Date Noted  . Left breast abscess 09/24/2012  . Cellulitis 07/14/2012  . Tobacco abuse 07/14/2012    Past Surgical History:  Procedure Laterality Date  . INCISION AND DRAINAGE ABSCESS Left 09/24/2012   Procedure: INCISION AND DRAINAGE BREAST ABSCESS;  Surgeon: Robyne AskewPaul S Toth III, MD;  Location: [facility];  Service: General;  Laterality: Left;  . INCISION AND DRAINAGE ABSCESS Left 05/08/2015   Procedure: INCISION AND DRAINAGE LEFT BREAST  ABSCESS;  Surgeon: Chevis PrettyPaul Toth III, MD;  Location: [facility];  Service: General;  Laterality: Left;    OB History    Gravida Para Term Preterm AB Living   1       1     SAB TAB Ectopic Multiple Live  Births   1               Home Medications    Prior to Admission medications   Medication Sig Start Date End Date Taking? Authorizing Provider  albuterol (PROVENTIL HFA;VENTOLIN HFA) 108 (90 Base) MCG/ACT inhaler Inhale into the lungs every 6 (six) hours as needed for wheezing or shortness of breath.    Historical Provider, MD  benzonatate (TESSALON) 100 MG capsule Take 1 capsule (100 mg total) by mouth every 8 (eight) hours. Patient not taking: Reported on 01/02/2016 10/25/15   Samantha Tripp Dowless, PA-C  metoCLOPramide (REGLAN) 10 MG tablet Take 1 tablet (10 mg total) by mouth every 6 (six) hours. 01/02/16   Cheri FowlerKayla Arlesia Kiel, PA-C  oxyCODONE-acetaminophen (ROXICET) 5-325 MG tablet Take 1-2 tablets by mouth every 4 (four) hours as needed. Patient not taking: Reported on 01/02/2016 05/08/15   Chevis PrettyPaul Toth III, MD  predniSONE (STERAPRED UNI-PAK 21 TAB) 10 MG (21) TBPK tablet Take 1 tablet (10 mg total) by mouth daily. Take 6 tabs by mouth daily  for 2 days, then 5 tabs for 2 days, then 4 tabs for 2 days, then 3 tabs for 2 days, 2 tabs for 2 days, then 1 tab by mouth daily for 2 days Patient not taking: Reported on 01/02/2016 11/26/15   Arthor CaptainAbigail Harris, PA-C  Family History Family History  Problem Relation Age of Onset  . Diabetes Mother     Social History Social History  Substance Use Topics  . Smoking status: Current Every Day Smoker    Packs/day: 0.50    Years: 10.00    Types: Cigarettes  . Smokeless tobacco: Never Used  . Alcohol use No     Allergies   Pineapple   Review of Systems Review of Systems All other systems negative unless otherwise stated in HPI   Physical Exam Updated Vital Signs BP 108/67 (BP Location: Right Arm)   Pulse 65   Temp 98.2 F (36.8 C) (Oral)   Resp 18   Ht 5\' 4"  (1.626 m)   Wt 95.3 kg   SpO2 100%   BMI 36.05 kg/m   Physical Exam  Constitutional: She is oriented to person, place, and time. She appears well-developed and well-nourished.   Non-toxic appearance. She does not have a sickly appearance. She does not appear ill.  HENT:  Head: Normocephalic and atraumatic.  Mouth/Throat: Oropharynx is clear and moist.  Eyes: Conjunctivae are normal. Pupils are equal, round, and reactive to light.  Neck: Normal range of motion. Neck supple.  Cardiovascular: Normal rate and regular rhythm.   Pulmonary/Chest: Effort normal and breath sounds normal. No accessory muscle usage or stridor. No respiratory distress. She has no wheezes. She has no rhonchi. She has no rales.  Abdominal: Soft. Bowel sounds are normal. She exhibits no distension. There is no tenderness. There is no rebound and no guarding.  Musculoskeletal: Normal range of motion.  Neurological: She is alert and oriented to person, place, and time.  Speech clear without dysarthria.  Skin: Skin is warm and dry.  Psychiatric: She has a normal mood and affect. Her behavior is normal.     ED Treatments / Results  Labs (all labs ordered are listed, but only abnormal results are displayed) Labs Reviewed  COMPREHENSIVE METABOLIC PANEL - Abnormal; Notable for the following:       Result Value   Creatinine, Ser 1.08 (*)    Total Protein 6.3 (*)    All other components within normal limits  LIPASE, BLOOD  CBC WITH DIFFERENTIAL/PLATELET  PREGNANCY, URINE  URINALYSIS, ROUTINE W REFLEX MICROSCOPIC    EKG  EKG Interpretation None       Radiology No results found.  Procedures Procedures (including critical care time)  Medications Ordered in ED Medications  sodium chloride 0.9 % bolus 1,000 mL (0 mLs Intravenous Stopped 01/02/16 1200)    And  0.9 %  sodium chloride infusion ( Intravenous Stopped 01/02/16 1337)  ondansetron (ZOFRAN) injection 4 mg (4 mg Intravenous Given 01/02/16 1131)  metoCLOPramide (REGLAN) injection 10 mg (10 mg Intravenous Given 01/02/16 1245)     Initial Impression / Assessment and Plan / ED Course  I have reviewed the triage vital signs and  the nursing notes.  Pertinent labs & imaging results that were available during my care of the patient were reviewed by me and considered in my medical decision making (see chart for details).  Clinical Course    Patient presents with N/V/D.  No hematemesis or bloody stools.  No fever.  No recent travel.  She has been tolerating PO at home.  Vitals stable.  She does not appear ill or septic.  Abdomen soft and benign without rebound, guarding, or rigidity.  Plan to obtain labs, give zofran, and IVF.  Labs without acute abnormalities.  UA without infection.  Patient given  zofran and reglan in ED with improvement.  She has been tolerating PO fluids without difficulty and feels improved.  Suspect viral illness.  Low suspicion for acute intra-abdominal pathology at this time.  Home with reglan. Recommend BRAT diet. Return precautions discussed.  Stable for discharge.    Final Clinical Impressions(s) / ED Diagnoses   Final diagnoses:  Nausea vomiting and diarrhea  Gastroenteritis    New Prescriptions New Prescriptions   METOCLOPRAMIDE (REGLAN) 10 MG TABLET    Take 1 tablet (10 mg total) by mouth every 6 (six) hours.     Cheri FowlerKayla Wyolene Weimann, PA-C 01/02/16 1410    Nira ConnPedro Eduardo Cardama, MD 01/02/16 [phone]

## 2016-01-02 NOTE — ED Notes (Signed)
Pt given gingerale for PO challenge 

## 2016-01-02 NOTE — Discharge Instructions (Signed)
Take Reglan every 6 hours as needed for nausea.  Follow up with your primary care physician in the next couple of days.  Return to the ED for persistent vomiting, bloody diarrhea, fever, worsening abdominal pain, or any new or concerning symptoms.

## 2016-01-02 NOTE — Discharge Planning (Signed)
Pt up for discharge. EDCM reviewed chart for possible CM needs.  No needs identified or communicated.  

## 2016-01-02 NOTE — ED Triage Notes (Signed)
Pt has c/o epigastric pain that started last night accompanied by emesis and diarrhea.

## 2016-04-10 ENCOUNTER — Encounter (HOSPITAL_COMMUNITY): Payer: Self-pay | Admitting: Emergency Medicine

## 2016-04-10 ENCOUNTER — Emergency Department (HOSPITAL_COMMUNITY)
Admission: EM | Admit: 2016-04-10 | Discharge: 2016-04-10 | Disposition: A | Discharge status: Home / Self Care | Payer: Self-pay | Attending: Emergency Medicine | Admitting: Emergency Medicine

## 2016-04-10 DIAGNOSIS — K029 Dental caries, unspecified: Secondary | ICD-10-CM | POA: Insufficient documentation

## 2016-04-10 DIAGNOSIS — F17210 Nicotine dependence, cigarettes, uncomplicated: Secondary | ICD-10-CM | POA: Insufficient documentation

## 2016-04-10 DIAGNOSIS — K047 Periapical abscess without sinus: Secondary | ICD-10-CM

## 2016-04-10 DIAGNOSIS — Z79899 Other long term (current) drug therapy: Secondary | ICD-10-CM | POA: Insufficient documentation

## 2016-04-10 DIAGNOSIS — J45909 Unspecified asthma, uncomplicated: Secondary | ICD-10-CM | POA: Insufficient documentation

## 2016-04-10 MED ORDER — AMOXICILLIN 500 MG PO CAPS: 500.0000 mg | ORAL_CAPSULE | Freq: Three times a day (TID) | ORAL | 0 refills | Status: DC

## 2016-04-10 MED ORDER — NAPROXEN 500 MG PO TABS: 500.0000 mg | ORAL_TABLET | Freq: Two times a day (BID) | ORAL | 0 refills | Status: DC

## 2016-04-10 MED ORDER — HYDROCODONE-ACETAMINOPHEN 5-325 MG PO TABS
1.0000 | ORAL_TABLET | Freq: Once | ORAL | Status: AC
  Administered 2016-04-10: 1 via ORAL
  Filled 2016-04-10: qty 1

## 2016-04-10 MED ORDER — AMOXICILLIN 500 MG PO CAPS
500.0000 mg | ORAL_CAPSULE | Freq: Once | ORAL | Status: AC
  Administered 2016-04-10: 500 mg via ORAL
  Filled 2016-04-10: qty 1

## 2016-04-10 NOTE — Discharge Instructions (Signed)
Follow-up with a dentist as soon as possible.

## 2016-04-10 NOTE — ED Provider Notes (Signed)
[facility] Provider Note   CSN: [id number] Arrival date & time: 04/10/16  2058  By signing my name below, I, Modena Jansky, attest that this documentation has been prepared under the direction and in the presence of non-physician practitioner, Kerrie Buffalo, NP. Electronically Signed: Modena Jansky, Scribe. 04/10/2016. 11:02 PM.  History   Chief Complaint Chief Complaint  Patient presents with  . Dental Pain   The history is provided by the patient. No language interpreter was used.  Dental Pain   This is a new problem. The current episode started 6 to 12 hours ago. The problem occurs constantly. The problem has not changed since onset.The pain is moderate. She has tried nothing for the symptoms.   HPI Comments: Mary Davies is a 26 y.o. female who presents to the Emergency Department complaining of left lower dental pain that started today. She states she has been having intermittent mild pain episodes prior to today. No treatment PTA. She describes the pain as throbbing. She denies any ear pain or other complaints.   Past Medical History:  Diagnosis Date  . Asthma    prn inhaler  . Fracture of metatarsal bone of left foot 04/22/2015   5th metatarsal  . Left breast abscess 04/2015    Patient Active Problem List   Diagnosis Date Noted  . Left breast abscess 09/24/2012  . Cellulitis 07/14/2012  . Tobacco abuse 07/14/2012    Past Surgical History:  Procedure Laterality Date  . INCISION AND DRAINAGE ABSCESS Left 09/24/2012   Procedure: INCISION AND DRAINAGE BREAST ABSCESS;  Surgeon: Robyne Askew, MD;  Location: [facility] OR;  Service: General;  Laterality: Left;  . INCISION AND DRAINAGE ABSCESS Left 05/08/2015   Procedure: INCISION AND DRAINAGE LEFT BREAST  ABSCESS;  Surgeon: Chevis Pretty III, MD;  Location: [facility];  Service: General;  Laterality: Left;    OB History    Gravida Para Term Preterm AB Living   1       1     SAB TAB Ectopic Multiple Live  Births   1               Home Medications    Prior to Admission medications   Medication Sig Start Date End Date Taking? Authorizing Provider  albuterol (PROVENTIL HFA;VENTOLIN HFA) 108 (90 Base) MCG/ACT inhaler Inhale into the lungs every 6 (six) hours as needed for wheezing or shortness of breath.    Historical Provider, MD  amoxicillin (AMOXIL) 500 MG capsule Take 1 capsule (500 mg total) by mouth 3 (three) times daily. 04/10/16   Hope Orlene Och, NP  benzonatate (TESSALON) 100 MG capsule Take 1 capsule (100 mg total) by mouth every 8 (eight) hours. Patient not taking: Reported on 01/02/2016 10/25/15   Samantha Tripp Dowless, PA-C  metoCLOPramide (REGLAN) 10 MG tablet Take 1 tablet (10 mg total) by mouth every 6 (six) hours. 01/02/16   Cheri Fowler, PA-C  naproxen (NAPROSYN) 500 MG tablet Take 1 tablet (500 mg total) by mouth 2 (two) times daily. 04/10/16   Hope Orlene Och, NP  oxyCODONE-acetaminophen (ROXICET) 5-325 MG tablet Take 1-2 tablets by mouth every 4 (four) hours as needed. Patient not taking: Reported on 01/02/2016 05/08/15   Chevis Pretty III, MD  predniSONE (STERAPRED UNI-PAK 21 TAB) 10 MG (21) TBPK tablet Take 1 tablet (10 mg total) by mouth daily. Take 6 tabs by mouth daily  for 2 days, then 5 tabs for 2 days, then 4 tabs for 2  days, then 3 tabs for 2 days, 2 tabs for 2 days, then 1 tab by mouth daily for 2 days Patient not taking: Reported on 01/02/2016 11/26/15   Arthor Captain, PA-C    Family History Family History  Problem Relation Age of Onset  . Diabetes Mother     Social History Social History  Substance Use Topics  . Smoking status: Current Every Day Smoker    Packs/day: 0.50    Years: 10.00    Types: Cigarettes  . Smokeless tobacco: Never Used  . Alcohol use No     Allergies   Pineapple   Review of Systems Review of Systems  Constitutional: Negative for chills and fever.  HENT: Positive for dental problem (Left lower). Negative for ear pain, sore throat and  trouble swallowing.   Gastrointestinal: Negative for nausea and vomiting.     Physical Exam Updated Vital Signs BP 122/80 (BP Location: Right Arm)   Pulse 72   Temp 98.2 F (36.8 C) (Oral)   Resp 16   Ht 5\' 4"  (1.626 m)   Wt 230 lb (104.3 kg)   LMP 03/27/2016   SpO2 99%   BMI 39.48 kg/m   Physical Exam  Constitutional: She appears well-developed and well-nourished. No distress.  HENT:  Head: Normocephalic.  Right Ear: Tympanic membrane, external ear and ear canal normal.  Left Ear: Tympanic membrane, external ear and ear canal normal.  Mouth/Throat: Uvula is midline. No posterior oropharyngeal edema or posterior oropharyngeal erythema.  Decayed tooth number 20 with erythema to surrounding gum.   Eyes: Conjunctivae are normal.  Neck: Neck supple.  Cardiovascular: Normal rate and regular rhythm.   Pulmonary/Chest: Effort normal.  Abdominal: Soft.  Musculoskeletal: Normal range of motion.  Lymphadenopathy:    She has cervical adenopathy (Left).  Neurological: She is alert.  Skin: Skin is warm and dry.  Psychiatric: She has a normal mood and affect.  Nursing note and vitals reviewed.    ED Treatments / Results  DIAGNOSTIC STUDIES: Oxygen Saturation is 99% on RA, normal by my interpretation.    COORDINATION OF CARE: 11:06 PM- Pt advised of plan for treatment and pt agrees.  Labs (all labs ordered are listed, but only abnormal results are displayed) Labs Reviewed - No data to display  Radiology No results found.  Procedures Procedures (including critical care time)  Medications Ordered in ED Medications  amoxicillin (AMOXIL) capsule 500 mg (not administered)  HYDROcodone-acetaminophen (NORCO/VICODIN) 5-325 MG per tablet 1 tablet (not administered)     Initial Impression / Assessment and Plan / ED Course  I have reviewed the triage vital signs and the nursing notes. Patient with toothache.  No abscess that required immediate I&D.  Exam unconcerning for  Ludwig's angina or spread of infection.  Will treat with penicillin and pain medicine.  Urged patient to follow-up with dentist.  Appears stable for d/c at this time.   Final Clinical Impressions(s) / ED Diagnoses   Final diagnoses:  Infected dental caries    New Prescriptions New Prescriptions   AMOXICILLIN (AMOXIL) 500 MG CAPSULE    Take 1 capsule (500 mg total) by mouth 3 (three) times daily.   NAPROXEN (NAPROSYN) 500 MG TABLET    Take 1 tablet (500 mg total) by mouth 2 (two) times daily.   I personally performed the services described in this documentation, which was scribed in my presence. The recorded information has been reviewed and is accurate.     [facility] Orlene Och, [place] 04/11/16 1550  Canary Brimhristopher J Tegeler, MD 04/12/16 Lyda [id number]

## 2016-04-10 NOTE — ED Triage Notes (Signed)
Pt has hole in tooth on left side of mouth. Started hurting today.  Unable to eat.  Sensitive hot and cold.

## 2016-04-17 ENCOUNTER — Emergency Department (HOSPITAL_COMMUNITY)
Admission: EM | Admit: 2016-04-17 | Discharge: 2016-04-17 | Disposition: A | Discharge status: Home / Self Care | Payer: Self-pay | Attending: Emergency Medicine | Admitting: Emergency Medicine

## 2016-04-17 ENCOUNTER — Emergency Department (HOSPITAL_COMMUNITY): Payer: Self-pay

## 2016-04-17 ENCOUNTER — Encounter (HOSPITAL_COMMUNITY): Payer: Self-pay | Admitting: *Deleted

## 2016-04-17 DIAGNOSIS — J40 Bronchitis, not specified as acute or chronic: Secondary | ICD-10-CM | POA: Insufficient documentation

## 2016-04-17 DIAGNOSIS — F17210 Nicotine dependence, cigarettes, uncomplicated: Secondary | ICD-10-CM | POA: Insufficient documentation

## 2016-04-17 DIAGNOSIS — R0789 Other chest pain: Secondary | ICD-10-CM | POA: Insufficient documentation

## 2016-04-17 LAB — URINALYSIS, ROUTINE W REFLEX MICROSCOPIC
BILIRUBIN URINE: NEGATIVE
Glucose, UA: NEGATIVE mg/dL
Hgb urine dipstick: NEGATIVE
KETONES UR: NEGATIVE mg/dL
Leukocytes, UA: NEGATIVE
NITRITE: NEGATIVE
PH: 5 (ref 5.0–8.0)
Protein, ur: NEGATIVE mg/dL
Specific Gravity, Urine: 1.028 (ref 1.005–1.030)

## 2016-04-17 LAB — POC URINE PREG, ED: Preg Test, Ur: NEGATIVE

## 2016-04-17 MED ORDER — ALBUTEROL SULFATE HFA 108 (90 BASE) MCG/ACT IN AERS
2.0000 | INHALATION_SPRAY | RESPIRATORY_TRACT | Status: DC | PRN
  Administered 2016-04-17: 2 via RESPIRATORY_TRACT
  Filled 2016-04-17: qty 6.7

## 2016-04-17 MED ORDER — GUAIFENESIN 200 MG PO TABS: 200.0000 mg | ORAL_TABLET | ORAL | 0 refills | Status: DC | PRN

## 2016-04-17 MED ORDER — AEROCHAMBER PLUS FLO-VU SMALL MISC
1.0000 | Freq: Once | Status: DC
  Filled 2016-04-17: qty 1

## 2016-04-17 MED ORDER — MELOXICAM 7.5 MG PO TABS
7.5000 mg | ORAL_TABLET | Freq: Every day | ORAL | 0 refills | Status: DC
Start: 2016-04-17 — End: 2017-01-16

## 2016-04-17 MED ORDER — IBUPROFEN 800 MG PO TABS
800.0000 mg | ORAL_TABLET | Freq: Once | ORAL | Status: AC
  Administered 2016-04-17: 800 mg via ORAL
  Filled 2016-04-17: qty 1

## 2016-04-17 NOTE — ED Notes (Signed)
ED Provider at bedside. 

## 2016-04-17 NOTE — ED Notes (Signed)
Pt unable to obtain urine sample at triage. 

## 2016-04-17 NOTE — ED Provider Notes (Signed)
[facility] Provider Note   CSN: [id number] Arrival date & time: 04/17/16  4098     History   Chief Complaint Chief Complaint  Patient presents with  . Abdominal Pain    HPI Mary Davies is a 26 y.o. female with a h/o of asthma and chronic bronchitis who presents with constant, worsening left chest wall pain that radiates around to her left back that began one week ago. She reports associated shortness of breath and a productive cough with clear sputum for one week. She reports the pain is worse with coughing, laying flat on her left side, and with deep breaths. No alleviating factors. No recent injury or trauma to the area. No history of similar pain or injury to the area. She denies treatment at home. No fever, chills, hemoptysis, abdominal pain, N/V/D, sore throat, HA, rash, or dysuria.   PMH includes chronic bronchitis and asthma. She reports she is out of her albuterol inhaler  at home because she does is not able to afford the medication. She is a current, everyday smoker.   HPI  Past Medical History:  Diagnosis Date  . Asthma    prn inhaler  . Fracture of metatarsal bone of left foot 04/22/2015   5th metatarsal  . Left breast abscess 04/2015    Patient Active Problem List   Diagnosis Date Noted  . Left breast abscess 09/24/2012  . Cellulitis 07/14/2012  . Tobacco abuse 07/14/2012    Past Surgical History:  Procedure Laterality Date  . INCISION AND DRAINAGE ABSCESS Left 09/24/2012   Procedure: INCISION AND DRAINAGE BREAST ABSCESS;  Surgeon: Robyne Askew, MD;  Location: [facility];  Service: General;  Laterality: Left;  . INCISION AND DRAINAGE ABSCESS Left 05/08/2015   Procedure: INCISION AND DRAINAGE LEFT BREAST  ABSCESS;  Surgeon: Chevis Pretty III, MD;  Location: [facility];  Service: General;  Laterality: Left;    OB History    Gravida Para Term Preterm AB Living   1       1     SAB TAB Ectopic Multiple Live Births   1               Home  Medications    Prior to Admission medications   Medication Sig Start Date End Date Taking? Authorizing Provider  albuterol (PROVENTIL HFA;VENTOLIN HFA) 108 (90 Base) MCG/ACT inhaler Inhale into the lungs every 6 (six) hours as needed for wheezing or shortness of breath.   Yes Historical Provider, MD  naproxen (NAPROSYN) 500 MG tablet Take 1 tablet (500 mg total) by mouth 2 (two) times daily. 04/10/16  Yes Hope Orlene Och, NP  guaiFENesin 200 MG tablet Take 1 tablet (200 mg total) by mouth every 4 (four) hours as needed for cough or to loosen phlegm. 04/17/16   Mia A McDonald, PA-C  meloxicam (MOBIC) 7.5 MG tablet Take 1 tablet (7.5 mg total) by mouth daily. 04/17/16   Mia A McDonald, PA-C    Family History Family History  Problem Relation Age of Onset  . Diabetes Mother     Social History Social History  Substance Use Topics  . Smoking status: Current Every Day Smoker    Packs/day: 0.50    Years: 10.00    Types: Cigarettes  . Smokeless tobacco: Never Used  . Alcohol use No     Allergies   Pineapple   Review of Systems Review of Systems  Constitutional: Negative for chills and fever.  HENT: Negative for  sore throat.   Respiratory: Positive for cough and shortness of breath.   Cardiovascular: Positive for chest pain.  Gastrointestinal: Negative for abdominal pain, diarrhea, nausea and vomiting.  Genitourinary: Negative for dysuria.  Musculoskeletal: Positive for back pain.  Skin: Negative for rash and wound.  Allergic/Immunologic: Negative for immunocompromised state.  Neurological: Negative for headaches.  All other systems reviewed and are negative.    Physical Exam Updated Vital Signs BP 119/71   Pulse 78   Temp 98.3 F (36.8 C) (Oral)   Resp 18   Ht  (1.626 m)   Wt 104.3 kg   LMP 03/27/2016   SpO2 99%   BMI 39.48 kg/m   Physical Exam  Constitutional: She is oriented to person, place, and time. She appears well-developed and well-nourished. No distress.    HENT:  Head: Normocephalic and atraumatic.  Eyes: Conjunctivae are normal.  Neck: Normal range of motion. Neck supple.  Cardiovascular: Normal rate, regular rhythm, normal heart sounds and intact distal pulses.  Exam reveals no gallop and no friction rub.   No murmur heard. Pulmonary/Chest: Effort normal. No respiratory distress. She has wheezes. She has no rales. She exhibits tenderness. She exhibits no mass, no laceration, no crepitus, no edema, no deformity, no swelling and no retraction.  Scattered inspiratory wheezes heard throughout bilaterally.  Reproducibly TTP to the left lateral chest wall that extends around to below the left breast.   Abdominal: Soft. Bowel sounds are normal. She exhibits no distension. There is no tenderness. There is no guarding.  Musculoskeletal: Normal range of motion. She exhibits no edema.  Lymphadenopathy:    She has no cervical adenopathy.  Neurological: She is alert and oriented to person, place, and time.  Skin: Skin is warm and dry. No rash noted. She is not diaphoretic.  Psychiatric: She has a normal mood and affect.  Nursing note and vitals reviewed.    ED Treatments / Results  Labs (all labs ordered are listed, but only abnormal results are displayed) Labs Reviewed  URINALYSIS, ROUTINE W REFLEX MICROSCOPIC  POC URINE PREG, ED  POC URINE PREG, ED    EKG  EKG Interpretation None       Radiology Dg Chest 2 View  Result Date: 04/17/2016 CLINICAL DATA:  26 year old presenting with a 1 week history of posterior left-sided chest pain and shortness of breath. Current history of asthma. Current smoker. EXAM: CHEST  2 VIEW COMPARISON:  10/25/2015, 08/31/2007. FINDINGS: Cardiomediastinal silhouette unremarkable, unchanged. Mildly prominent bronchovascular markings and mild central peribronchial thickening, more so than on prior examinations. Lungs otherwise clear. Pulmonary vascularity normal. No visible pleural effusions. No pneumothorax.  Visualized bony thorax intact. IMPRESSION: Mild changes of bronchitis and/or asthma without focal airspace pneumonia. Electronically Signed   By: Hulan Saas M.D.   On: 04/17/2016 16:20    Procedures Procedures (including critical care time)  Medications Ordered in ED Medications  ibuprofen (ADVIL,MOTRIN) tablet 800 mg (800 mg Oral Given 04/17/16 1626)     Initial Impression / Assessment and Plan / ED Course  I have reviewed the triage vital signs and the nursing notes.  Pertinent labs & imaging results that were available during my care of the patient were reviewed by me and considered in my medical decision making (see chart for details).     Patient in ED with O2 saturations maintained >90, no current signs of respiratory distress. Scattered wheezes heard throughout. CSR with mild bronchitis and/or asthma changes; no focal PNA or pneumothorax. No risk  factors for PE. Pain reproducible with palpation. Most likely musculoskeletal secondary to coughing over the last week. Patient declined nebulizer treatment in the ED. Will treat with 2 puffs of albuterol and spacer here today. Spacer education provided. Ibuprofen given for pain. She reports she is feeling better. Will discharge with albuterol and spacer for dyspnea, anti-inflammatories for musculoskeletal pain and guaifenesin for cough. Counseled on smoking cessation and cutting back on nicotine intake. Counseled on pneumonia prevention. Discussed return precautions to the ED. The patient understands the plan and is agreeable at this time.   Final Clinical Impressions(s) / ED Diagnoses   Final diagnoses:  Bronchitis  Left-sided chest wall pain    New Prescriptions Discharge Medication List as of 04/17/2016  4:56 PM    START taking these medications   Details  guaiFENesin 200 MG tablet Take 1 tablet (200 mg total) by mouth every 4 (four) hours as needed for cough or to loosen phlegm., Starting Wed 04/17/2016, Print    meloxicam  (MOBIC) 7.5 MG tablet Take 1 tablet (7.5 mg total) by mouth daily., Starting Wed 04/17/2016, Print         Mia A McDonald, PA-C 04/18/16 1331    Arby Barrette, MD 04/24/16 [phone]

## 2016-04-17 NOTE — Discharge Instructions (Signed)
Please return to the Emergency Department for new or worsening symptoms.  °

## 2016-04-17 NOTE — ED Triage Notes (Addendum)
Pt reports left side rib pain x 1 week, increases with movement and palpation. Pain radiates around her side.  Denies n/v/d.

## 2016-04-17 NOTE — ED Notes (Signed)
Patient transported to X-ray 

## 2016-05-11 ENCOUNTER — Emergency Department (HOSPITAL_COMMUNITY)
Admission: EM | Admit: 2016-05-11 | Discharge: 2016-05-11 | Disposition: A | Discharge status: Home / Self Care | Payer: Self-pay | Attending: Emergency Medicine | Admitting: Emergency Medicine

## 2016-05-11 ENCOUNTER — Encounter (HOSPITAL_COMMUNITY): Payer: Self-pay | Admitting: *Deleted

## 2016-05-11 DIAGNOSIS — F17210 Nicotine dependence, cigarettes, uncomplicated: Secondary | ICD-10-CM | POA: Insufficient documentation

## 2016-05-11 DIAGNOSIS — N3001 Acute cystitis with hematuria: Secondary | ICD-10-CM

## 2016-05-11 DIAGNOSIS — J45909 Unspecified asthma, uncomplicated: Secondary | ICD-10-CM | POA: Insufficient documentation

## 2016-05-11 DIAGNOSIS — N390 Urinary tract infection, site not specified: Secondary | ICD-10-CM

## 2016-05-11 LAB — URINALYSIS, ROUTINE W REFLEX MICROSCOPIC
BILIRUBIN URINE: NEGATIVE
GLUCOSE, UA: NEGATIVE mg/dL
KETONES UR: NEGATIVE mg/dL
NITRITE: NEGATIVE
PH: 5 (ref 5.0–8.0)
Protein, ur: 100 mg/dL — AB
SPECIFIC GRAVITY, URINE: 1.03 (ref 1.005–1.030)

## 2016-05-11 LAB — PREGNANCY, URINE: Preg Test, Ur: NEGATIVE

## 2016-05-11 MED ORDER — SULFAMETHOXAZOLE-TRIMETHOPRIM 800-160 MG PO TABS: 1.0000 | ORAL_TABLET | Freq: Two times a day (BID) | ORAL | 0 refills | Status: DC

## 2016-05-11 NOTE — ED Provider Notes (Signed)
[facility]-EMER[facility]Y DEPT Provider Note   CSN: [id number] Arrival date & time: 05/11/16  1415     History   Chief Complaint No chief complaint on file.   HPI Mary Davies is a 26 y.o. female with a PMHx of asthma, who presents to the ED with complaints of increased urinary frequency and urgency as well as hesitancy 1 week. Patient states she's never had a UTI before but thinks this is what's going on. She reports that when she has the urge to use the restroom, she attempts to urinate but only a few droplets come out, and she continues to have feeling of suprapubic fullness and incomplete emptying. She denies any pain associated with this. She reports one episode of scant hematuria as well as cloudy darker urine. She has not tried anything for her symptoms, no known aggravating or alleviating factors. LMP was 3 weeks ago. She denies any possibility of pregnancy. She is sexually active with one female harder, unprotected, but no sexual activity in the last 3 weeks. She denies any fevers, chills, CP, SOB, abd pain, nausea/vomiting, diarrhea/constipation, obstipation, melena, hematochezia, malodorous urine, dysuria, vaginal bleeding/discharge, flank pain, myalgias, arthralgias, numbness, tingling, focal weakness, or any other complaints at this time. Denies recent travel, sick contacts, suspicious food intake, EtOH use, NSAID use, or prior abd surgeries.    The history is provided by the patient and medical records. No language interpreter was used.  Urinary Frequency  This is a new problem. The current episode started more than 2 days ago. The problem occurs constantly. The problem has not changed since onset.Pertinent negatives include no chest pain, no abdominal pain and no shortness of breath. Nothing aggravates the symptoms. Nothing relieves the symptoms. She has tried nothing for the symptoms. The treatment provided no relief.    Past Medical History:  Diagnosis Date  . Asthma    prn inhaler   . Fracture of metatarsal bone of left foot 04/22/2015   5th metatarsal  . Left breast abscess 04/2015    Patient Active Problem List   Diagnosis Date Noted  . Left breast abscess 09/24/2012  . Cellulitis 07/14/2012  . Tobacco abuse 07/14/2012    Past Surgical History:  Procedure Laterality Date  . INCISION AND DRAINAGE ABSCESS Left 09/24/2012   Procedure: INCISION AND DRAINAGE BREAST ABSCESS;  Surgeon: Robyne Askew, MD;  Location: [facility] OR;  Service: General;  Laterality: Left;  . INCISION AND DRAINAGE ABSCESS Left 05/08/2015   Procedure: INCISION AND DRAINAGE LEFT BREAST  ABSCESS;  Surgeon: Chevis Pretty III, MD;  Location: [facility];  Service: General;  Laterality: Left;    OB History    Gravida Para Term Preterm AB Living   1       1     SAB TAB Ectopic Multiple Live Births   1               Home Medications    Prior to Admission medications   Medication Sig Start Date End Date Taking? Authorizing Provider  albuterol (PROVENTIL HFA;VENTOLIN HFA) 108 (90 Base) MCG/ACT inhaler Inhale into the lungs every 6 (six) hours as needed for wheezing or shortness of breath.    Historical Provider, MD  guaiFENesin 200 MG tablet Take 1 tablet (200 mg total) by mouth every 4 (four) hours as needed for cough or to loosen phlegm. 04/17/16   Mia A McDonald, PA-C  meloxicam (MOBIC) 7.5 MG tablet Take 1 tablet (7.5 mg total) by mouth daily.  04/17/16   Mia A McDonald, PA-C  naproxen (NAPROSYN) 500 MG tablet Take 1 tablet (500 mg total) by mouth 2 (two) times daily. 04/10/16   Hope Orlene Och, NP    Family History Family History  Problem Relation Age of Onset  . Diabetes Mother     Social History Social History  Substance Use Topics  . Smoking status: Current Every Day Smoker    Packs/day: 0.50    Years: 10.00    Types: Cigarettes  . Smokeless tobacco: Never Used  . Alcohol use No     Allergies   Pineapple   Review of Systems Review of Systems  Constitutional: Negative  for chills and fever.  Respiratory: Negative for shortness of breath.   Cardiovascular: Negative for chest pain.  Gastrointestinal: Negative for abdominal pain, blood in stool, constipation, diarrhea, nausea and vomiting.  Genitourinary: Positive for difficulty urinating (hesitancy), frequency, hematuria and urgency. Negative for dysuria, flank pain, vaginal bleeding and vaginal discharge.       +cloudy darker urine  Musculoskeletal: Negative for arthralgias and myalgias.  Skin: Negative for color change.  Allergic/Immunologic: Negative for immunocompromised state.  Neurological: Negative for weakness and numbness.  Psychiatric/Behavioral: Negative for confusion.   All other systems reviewed and are negative for acute change except as noted in the HPI.    Physical Exam Updated Vital Signs BP 123/82 (BP Location: Right Arm)   Pulse 79   Temp 98.3 F (36.8 C) (Oral)   Resp 14   Ht  (1.626 m)   Wt 99.8 kg   SpO2 98%   BMI 37.76 kg/m   Physical Exam  Constitutional: She is oriented to person, place, and time. Vital signs are normal. She appears well-developed and well-nourished.  Non-toxic appearance. No distress.  Afebrile, nontoxic, NAD  HENT:  Head: Normocephalic and atraumatic.  Mouth/Throat: Oropharynx is clear and moist and mucous membranes are normal.  Eyes: Conjunctivae and EOM are normal. Right eye exhibits no discharge. Left eye exhibits no discharge.  Neck: Normal range of motion. Neck supple.  Cardiovascular: Normal rate, regular rhythm, normal heart sounds and intact distal pulses.  Exam reveals no gallop and no friction rub.   No murmur heard. Pulmonary/Chest: Effort normal and breath sounds normal. No respiratory distress. She has no decreased breath sounds. She has no wheezes. She has no rhonchi. She has no rales.  Abdominal: Soft. Normal appearance and bowel sounds are normal. She exhibits no distension. There is no tenderness. There is no rigidity, no rebound,  no guarding, no CVA tenderness, no tenderness at McBurney's point and negative Murphy's sign.  Soft, NTND, +BS throughout, no r/g/r, neg murphy's, neg mcburney's, no CVA TTP   Musculoskeletal: Normal range of motion.  Neurological: She is alert and oriented to person, place, and time. She has normal strength. No sensory deficit.  Skin: Skin is warm, dry and intact. No rash noted.  Psychiatric: She has a normal mood and affect.  Nursing note and vitals reviewed.    ED Treatments / Results  Labs (all labs ordered are listed, but only abnormal results are displayed) Labs Reviewed  URINALYSIS, ROUTINE W REFLEX MICROSCOPIC - Abnormal; Notable for the following:       Result Value   APPearance CLOUDY (*)    Hgb urine dipstick MODERATE (*)    Protein, ur 100 (*)    Leukocytes, UA LARGE (*)    Bacteria, UA FEW (*)    Squamous Epithelial / LPF 0-5 (*)  All other components within normal limits  URINE CULTURE  PREGNANCY, URINE    EKG  EKG Interpretation None       Radiology No results found.  Procedures Procedures (including critical care time)  Medications Ordered in ED Medications - No data to display   Initial Impression / Assessment and Plan / ED Course  I have reviewed the triage vital signs and the nursing notes.  Pertinent labs & imaging results that were available during my care of the patient were reviewed by me and considered in my medical decision making (see chart for details).     26 y.o. female here with UTI symptoms x1wk including urinary freq/urgency and hesitancy, as well as suprapubic fullness but no pain, and darker cloudy urine. No prior UTI but thinks she has one. On exam, no abdominal tenderness, no flank tenderness. No vaginal complaints, doubt need for pelvic exam. U/A with cloudy urine, +leuks, +TNTC RBC and WBCs, +few bacteria, and only 0-5 squamous, consistent with UTI. Upreg pending. Will add-on UCx as well since there's only few bacteria seen.  Assuming Upreg neg, will send home with abx for UTI. Advised hydration, OTC remedies for symptomatic relief, and f/up with PCP in 1wk for recheck. Will reassess after Upreg results. Doubt need for labs or further emergent work up at this time.  4:06 PM Upreg neg. Will d/c home with previously outlined plan, and bactrim for UTI. I explained the diagnosis and have given explicit precautions to return to the ER including for any other new or worsening symptoms. The patient understands and accepts the medical plan as it's been dictated and I have answered their questions. Discharge instructions concerning home care and prescriptions have been given. The patient is STABLE and is discharged to home in good condition.    Final Clinical Impressions(s) / ED Diagnoses   Final diagnoses:  Acute cystitis with hematuria  Acute lower UTI (urinary tract infection)    New Prescriptions New Prescriptions   SULFAMETHOXAZOLE-TRIMETHOPRIM (BACTRIM DS,SEPTRA DS) 800-160 MG TABLET    Take 1 tablet by mouth 2 (two) times daily.     [id number] PA-C 05/11/16 1606    Doug Sou, MD 05/11/16 [phone]

## 2016-05-11 NOTE — Discharge Instructions (Signed)
Stay very well hydrated with plenty of water throughout the day. Take antibiotic until completed. May consider over-the-counter Pyridium for pain relief, but don't take this longer than 3 days, and be aware that it may turn your urine bright orange. This is a harmless side effect. Alternate between tylenol and motrin as needed for pain. Follow up with your primary care physician in 1 week for recheck of ongoing symptoms but return to ER for emergent changing or worsening of symptoms. Please seek immediate care if you develop the following: You develop back pain.  Your symptoms are no better, or worse in 3 days. There is severe back pain or lower abdominal pain.  You develop chills.  You have a fever.  There is nausea or vomiting.  There is continued burning or discomfort with urination.

## 2016-05-11 NOTE — ED Triage Notes (Addendum)
Pt reports scant urine onset x 1 wk, pt reports small amt of hematuria once, denies dysuria, denies vaginal discharge, denies fever,  pt A&O x4

## 2016-05-13 LAB — URINE CULTURE: Culture: >=100000 — AB

## 2016-05-14 ENCOUNTER — Telehealth: Payer: Self-pay | Admitting: Emergency Medicine

## 2016-05-14 NOTE — Telephone Encounter (Signed)
Post ED Visit - Positive Culture Follow-up  Culture report reviewed by antimicrobial stewardship pharmacist:   Enzo Bi, Pharm.D.  Celedonio Miyamoto, Pharm.D., BCPS AQ-ID  Garvin Fila, Pharm.D., BCPS  Georgina Pillion, [address].D., BCPS  Roy, [address].D., BCPS, AAHIVP  Estella Husk, Pharm.D., BCPS, AAHIVP  Lysle Pearl, PharmD, BCPS  Casilda Carls, PharmD, BCPS  Pollyann Samples, PharmD, BCPS  Positive urine culture Treated with bactrim DS, organism sensitive to the same and no further patient follow-up is required at this time.  Berle Mull 05/14/2016, 12:41 PM

## 2016-05-26 ENCOUNTER — Encounter (HOSPITAL_COMMUNITY): Payer: Self-pay | Admitting: Emergency Medicine

## 2016-05-26 ENCOUNTER — Emergency Department (HOSPITAL_COMMUNITY)
Admission: EM | Admit: 2016-05-26 | Discharge: 2016-05-26 | Disposition: A | Discharge status: Home / Self Care | Payer: Self-pay | Attending: Emergency Medicine | Admitting: Emergency Medicine

## 2016-05-26 DIAGNOSIS — T23221D Burn of second degree of single right finger (nail) except thumb, subsequent encounter: Secondary | ICD-10-CM | POA: Insufficient documentation

## 2016-05-26 DIAGNOSIS — Z79899 Other long term (current) drug therapy: Secondary | ICD-10-CM | POA: Insufficient documentation

## 2016-05-26 DIAGNOSIS — J45909 Unspecified asthma, uncomplicated: Secondary | ICD-10-CM | POA: Insufficient documentation

## 2016-05-26 DIAGNOSIS — F17210 Nicotine dependence, cigarettes, uncomplicated: Secondary | ICD-10-CM | POA: Insufficient documentation

## 2016-05-26 DIAGNOSIS — T23202A Burn of second degree of left hand, unspecified site, initial encounter: Secondary | ICD-10-CM

## 2016-05-26 DIAGNOSIS — X19XXXD Contact with other heat and hot substances, subsequent encounter: Secondary | ICD-10-CM | POA: Insufficient documentation

## 2016-05-26 NOTE — Discharge Instructions (Signed)
Use neosporin and keep clean with soap and water Change bandage daily until wound heals Return for worsening symptoms

## 2016-05-26 NOTE — ED Triage Notes (Signed)
Pt reports burn to her left middle finger 2 days ago at work. Pt went to work today and was told she needed a work note. No drainage noted.

## 2016-05-26 NOTE — ED Provider Notes (Signed)
[facility]PT Provider Note    By signing my name below, I, [name], attest that this documentation has been prepared under the direction and in the presence of Terance HartKelly Lanetra Hartley, PA-C. Electronically Signed: [name], ED Scribe. 05/26/16. 1:49 PM.    History   Chief Complaint Chief Complaint  Patient presents with  . Hand Burn   The history is provided by the patient and medical records. No language interpreter was used.    Mary HatchetJacqueline Davies is a 26 y.o. female who presents to the Emergency Department wanting a doctor's note to return to work. She reports she was burned on the left middle finger 3 days ago while at work. She states the burn is improving. There are no modifying factors noted. She denies fever, chills, nausea, vomiting, drainage from the area. Her last tetanus vaccination was four years ago.   Past Medical History:  Diagnosis Date  . Asthma    prn inhaler  . Fracture of metatarsal bone of left foot 04/22/2015   5th metatarsal  . Left breast abscess 04/2015    Patient Active Problem List   Diagnosis Date Noted  . Left breast abscess 09/24/2012  . Cellulitis 07/14/2012  . Tobacco abuse 07/14/2012    Past Surgical History:  Procedure Laterality Date  . INCISION AND DRAINAGE ABSCESS Left 09/24/2012   Procedure: INCISION AND DRAINAGE BREAST ABSCESS;  Surgeon: Robyne AskewPaul S Toth III, MD;  Location: [facility] OR;  Service: General;  Laterality: Left;  . INCISION AND DRAINAGE ABSCESS Left 05/08/2015   Procedure: INCISION AND DRAINAGE LEFT BREAST  ABSCESS;  Surgeon: Chevis PrettyPaul Toth III, MD;  Location: [facility];  Service: General;  Laterality: Left;    OB History    Gravida Para Term Preterm AB Living   1       1     SAB TAB Ectopic Multiple Live Births   1               Home Medications    Prior to Admission medications   Medication Sig Start Date End Date Taking? Authorizing Provider  albuterol (PROVENTIL HFA;VENTOLIN HFA) 108 (90 Base)  MCG/ACT inhaler Inhale into the lungs every 6 (six) hours as needed for wheezing or shortness of breath.    [provider]  guaiFENesin 200 MG tablet Take 1 tablet (200 mg total) by mouth every 4 (four) hours as needed for cough or to loosen phlegm. 04/17/16   McDonald, Mia A, PA-C  meloxicam (MOBIC) 7.5 MG tablet Take 1 tablet (7.5 mg total) by mouth daily. 04/17/16   McDonald, Mia A, PA-C  naproxen (NAPROSYN) 500 MG tablet Take 1 tablet (500 mg total) by mouth 2 (two) times daily. 04/10/16   Janne NapoleonNeese, Hope M, NP  sulfamethoxazole-trimethoprim (BACTRIM DS,SEPTRA DS) 800-160 MG tablet Take 1 tablet by mouth 2 (two) times daily. 05/11/16   Street, Robert LeeMercedes, PA-C    Family History Family History  Problem Relation Age of Onset  . Diabetes Mother     Social History Social History  Substance Use Topics  . Smoking status: Current Every Day Smoker    Packs/day: 0.50    Years: 10.00    Types: Cigarettes  . Smokeless tobacco: Never Used  . Alcohol use No     Allergies   Pineapple   Review of Systems Review of Systems  Constitutional: Negative for chills and fever.  Gastrointestinal: Negative for nausea and vomiting.  Skin: Positive for wound.     Physical Exam Updated  Vital Signs BP 127/89   Pulse 83   Temp 98.9 F (37.2 C) (Oral)   Resp 18   Ht 5\' 4"  (1.626 m)   Wt 220 lb (99.8 kg)   LMP 04/26/2016   SpO2 98%   BMI 37.76 kg/m   Physical Exam  Constitutional: She is oriented to person, place, and time. She appears well-developed and well-nourished.  HENT:  Head: Normocephalic and atraumatic.  Neck: Normal range of motion.  Cardiovascular: Normal rate.   Pulmonary/Chest: Effort normal.  Musculoskeletal: Normal range of motion.  Neurological: She is alert and oriented to person, place, and time.  Skin: Skin is warm and dry.  2 x 1 cm second degree burn on lateral side of left third finger. Well healing with no signs of infection.  Psychiatric: She has a normal mood  and affect. Her behavior is normal.  Nursing note and vitals reviewed.    ED Treatments / Results  DIAGNOSTIC STUDIES: Oxygen Saturation is 98% on RA, normal by my interpretation.   COORDINATION OF CARE: 1:45 PM- Recommended Neosporin ointment. Will give work note. Pt verbalizes understanding and agrees to plan.  Medications - No data to display  Labs (all labs ordered are listed, but only abnormal results are displayed) Labs Reviewed - No data to display  EKG  EKG Interpretation None       Radiology No results found.  Procedures Procedures (including critical care time)  Medications Ordered in ED Medications - No data to display   Initial Impression / Assessment and Plan / ED Course  I have reviewed the triage vital signs and the nursing notes.  Pertinent labs & imaging results that were available during my care of the patient were reviewed by me and considered in my medical decision making (see chart for details).  Patient presents for a wound check. She states she burned her third left finger while at work three days ago. The region appears to be well-healing with no signs of infection. Afebrile and hemodynamically stable. Pt is instructed to continue with home care. Pt has a good understanding of return precautions and is safe for discharge at this time. Will provide work note to return without restriction.   Final Clinical Impressions(s) / ED Diagnoses   Final diagnoses:  Partial thickness burn of left hand, unspecified site of hand, initial encounter    New Prescriptions Discharge Medication List as of 05/26/2016  1:50 PM      I personally performed the services described in this documentation, which was scribed in my presence. The recorded information has been reviewed and is accurate.     Bethel Born, PA-C 05/26/16 1507    Raeford Razor, MD 05/26/16 [phone]

## 2016-07-14 ENCOUNTER — Emergency Department (HOSPITAL_COMMUNITY)
Admission: EM | Admit: 2016-07-14 | Discharge: 2016-07-14 | Disposition: A | Discharge status: Home / Self Care | Payer: Self-pay | Attending: Emergency Medicine | Admitting: Emergency Medicine

## 2016-07-14 ENCOUNTER — Encounter (HOSPITAL_COMMUNITY): Payer: Self-pay | Admitting: *Deleted

## 2016-07-14 DIAGNOSIS — F17210 Nicotine dependence, cigarettes, uncomplicated: Secondary | ICD-10-CM | POA: Insufficient documentation

## 2016-07-14 DIAGNOSIS — J45909 Unspecified asthma, uncomplicated: Secondary | ICD-10-CM | POA: Insufficient documentation

## 2016-07-14 DIAGNOSIS — Z79899 Other long term (current) drug therapy: Secondary | ICD-10-CM | POA: Insufficient documentation

## 2016-07-14 DIAGNOSIS — J40 Bronchitis, not specified as acute or chronic: Secondary | ICD-10-CM

## 2016-07-14 DIAGNOSIS — J209 Acute bronchitis, unspecified: Secondary | ICD-10-CM | POA: Insufficient documentation

## 2016-07-14 MED ORDER — ALBUTEROL SULFATE HFA 108 (90 BASE) MCG/ACT IN AERS
2.0000 | INHALATION_SPRAY | Freq: Once | RESPIRATORY_TRACT | Status: AC
  Administered 2016-07-14: 2 via RESPIRATORY_TRACT
  Filled 2016-07-14: qty 6.7

## 2016-07-14 MED ORDER — PREDNISONE 20 MG PO TABS
60.0000 mg | ORAL_TABLET | Freq: Once | ORAL | Status: AC
  Administered 2016-07-14: 60 mg via ORAL
  Filled 2016-07-14: qty 3

## 2016-07-14 MED ORDER — PREDNISONE 20 MG PO TABS: 40.0000 mg | ORAL_TABLET | Freq: Every day | ORAL | 0 refills | Status: DC

## 2016-07-14 NOTE — ED Notes (Signed)
Declined W/C at D/C and was escorted to lobby by RN. 

## 2016-07-14 NOTE — Discharge Instructions (Signed)
For the next 2 days please use the provided albuterol inhaler every 4 hours.  You also need to take the prescribed steroids as directed.  She'll follow-up with the primary care physician, or return here for concerning changes in your condition.

## 2016-07-14 NOTE — ED Provider Notes (Signed)
[facility] Provider Note   CSN: [id number] Arrival date & time: 07/14/16  1155     History   Chief Complaint Chief Complaint  Patient presents with  . Cough    HPI Mary Davies is a 26 y.o. female.  HPI Patient presents with concern of cough, dyspnea. Patient has a formal medical problems, has suspicion for undiagnosed asthma. Patient smokes, but is trying to quit. She now presents with concern of cough, clear sputum production, generalized discomfort. Onset was maybe several weeks ago, worse over the past few days. Symptoms are worse at work, she works in a Surveyor, mining at [employer]. No fever, no chills, no syncope, no chest pain, no nausea, no vomiting. She has no albuterol at home, and there are no other clear alleviating or exacerbating factors.  Past Medical History:  Diagnosis Date  . Asthma    prn inhaler  . Fracture of metatarsal bone of left foot 04/22/2015   5th metatarsal  . Left breast abscess 04/2015    Patient Active Problem List   Diagnosis Date Noted  . Left breast abscess 09/24/2012  . Cellulitis 07/14/2012  . Tobacco abuse 07/14/2012    Past Surgical History:  Procedure Laterality Date  . INCISION AND DRAINAGE ABSCESS Left 09/24/2012   Procedure: INCISION AND DRAINAGE BREAST ABSCESS;  Surgeon: Robyne Askew, MD;  Location: [facility] OR;  Service: General;  Laterality: Left;  . INCISION AND DRAINAGE ABSCESS Left 05/08/2015   Procedure: INCISION AND DRAINAGE LEFT BREAST  ABSCESS;  Surgeon: Chevis Pretty III, MD;  Location: [facility];  Service: General;  Laterality: Left;    OB History    Gravida Para Term Preterm AB Living   1       1     SAB TAB Ectopic Multiple Live Births   1               Home Medications    Prior to Admission medications   Medication Sig Start Date End Date Taking? Authorizing Provider  albuterol (PROVENTIL HFA;VENTOLIN HFA) 108 (90 Base) MCG/ACT inhaler Inhale into the lungs every 6 (six) hours as  needed for wheezing or shortness of breath.    [provider]  guaiFENesin 200 MG tablet Take 1 tablet (200 mg total) by mouth every 4 (four) hours as needed for cough or to loosen phlegm. 04/17/16   McDonald, Mia A, PA-C  meloxicam (MOBIC) 7.5 MG tablet Take 1 tablet (7.5 mg total) by mouth daily. 04/17/16   McDonald, Mia A, PA-C  naproxen (NAPROSYN) 500 MG tablet Take 1 tablet (500 mg total) by mouth 2 (two) times daily. 04/10/16   Janne Napoleon, NP  predniSONE (DELTASONE) 20 MG tablet Take 2 tablets (40 mg total) by mouth daily with breakfast. For the next four days 07/14/16   Gerhard Munch, MD  sulfamethoxazole-trimethoprim (BACTRIM DS,SEPTRA DS) 800-160 MG tablet Take 1 tablet by mouth 2 (two) times daily. 05/11/16   Street, Simpson, PA-C    Family History Family History  Problem Relation Age of Onset  . Diabetes Mother     Social History Social History  Substance Use Topics  . Smoking status: Current Every Day Smoker    Packs/day: 0.50    Years: 10.00    Types: Cigarettes  . Smokeless tobacco: Never Used  . Alcohol use No     Allergies   Pineapple   Review of Systems Review of Systems  Constitutional:       Per  HPI, otherwise negative  HENT:       Per HPI, otherwise negative  Respiratory:       Per HPI, otherwise negative  Cardiovascular:       Per HPI, otherwise negative  Gastrointestinal: Negative for vomiting.  Endocrine:       Negative aside from HPI  Genitourinary:       Neg aside from HPI   Musculoskeletal:       Per HPI, otherwise negative  Skin: Negative.   Neurological: Negative for syncope.     Physical Exam Updated Vital Signs BP 133/87 (BP Location: Left Arm)   Pulse 80   Temp 97.7 F (36.5 C) (Oral)   Resp 18   LMP 07/07/2016   SpO2 94%   Physical Exam  Constitutional: She is oriented to person, place, and time. She appears well-developed and well-nourished. No distress.  HENT:  Head: Normocephalic and atraumatic.  Eyes:  Conjunctivae and EOM are normal.  Cardiovascular: Normal rate and regular rhythm.   Pulmonary/Chest: Effort normal. No stridor. No respiratory distress. She has wheezes.  Abdominal: She exhibits no distension.  Musculoskeletal: She exhibits no edema.  Neurological: She is alert and oriented to person, place, and time. No cranial nerve deficit.  Skin: Skin is warm and dry.  Psychiatric: She has a normal mood and affect.  Nursing note and vitals reviewed.    ED Treatments / Results   Procedures Procedures (including critical care time)  Medications Ordered in ED Medications  albuterol (PROVENTIL HFA;VENTOLIN HFA) 108 (90 Base) MCG/ACT inhaler 2 puff (not administered)  predniSONE (DELTASONE) tablet 60 mg (not administered)     Initial Impression / Assessment and Plan / ED Course  I have reviewed the triage vital signs and the nursing notes.  Pertinent labs & imaging results that were available during my care of the patient were reviewed by me and considered in my medical decision making (see chart for details).  Young female smoker presents with several days of cough, mild dyspnea. Here patient has borderline hypoxia, but given her history of smoking, suspicion for undiagnosed asthma, does not substantial, she has no evidence for respiratory distress, nodes or bacteremia or sepsis audible breath sounds in both lung fields and there is low suspicion for concurrent pneumonia. Patient started on steroids, albuterol, provided resources for outpatient follow-up.  Final Clinical Impressions(s) / ED Diagnoses   Final diagnoses:  Bronchitis    New Prescriptions New Prescriptions   PREDNISONE (DELTASONE) 20 MG TABLET    Take 2 tablets (40 mg total) by mouth daily with breakfast. For the next four days     Gerhard MunchLockwood, Jenasis Straley, MD 07/14/16 1230

## 2016-07-14 NOTE — ED Triage Notes (Signed)
Pt reports ongoing productive cough, causing sob. Denies fever.

## 2016-08-12 IMAGING — DX DG ANKLE COMPLETE 3+V*L*
3 series · 3 of 3 positions shown · non-contrast
Comparison: None.

CLINICAL DATA: Pain after trauma

EXAM:
LEFT ANKLE COMPLETE - 3+ VIEW

[x ankle ap left]
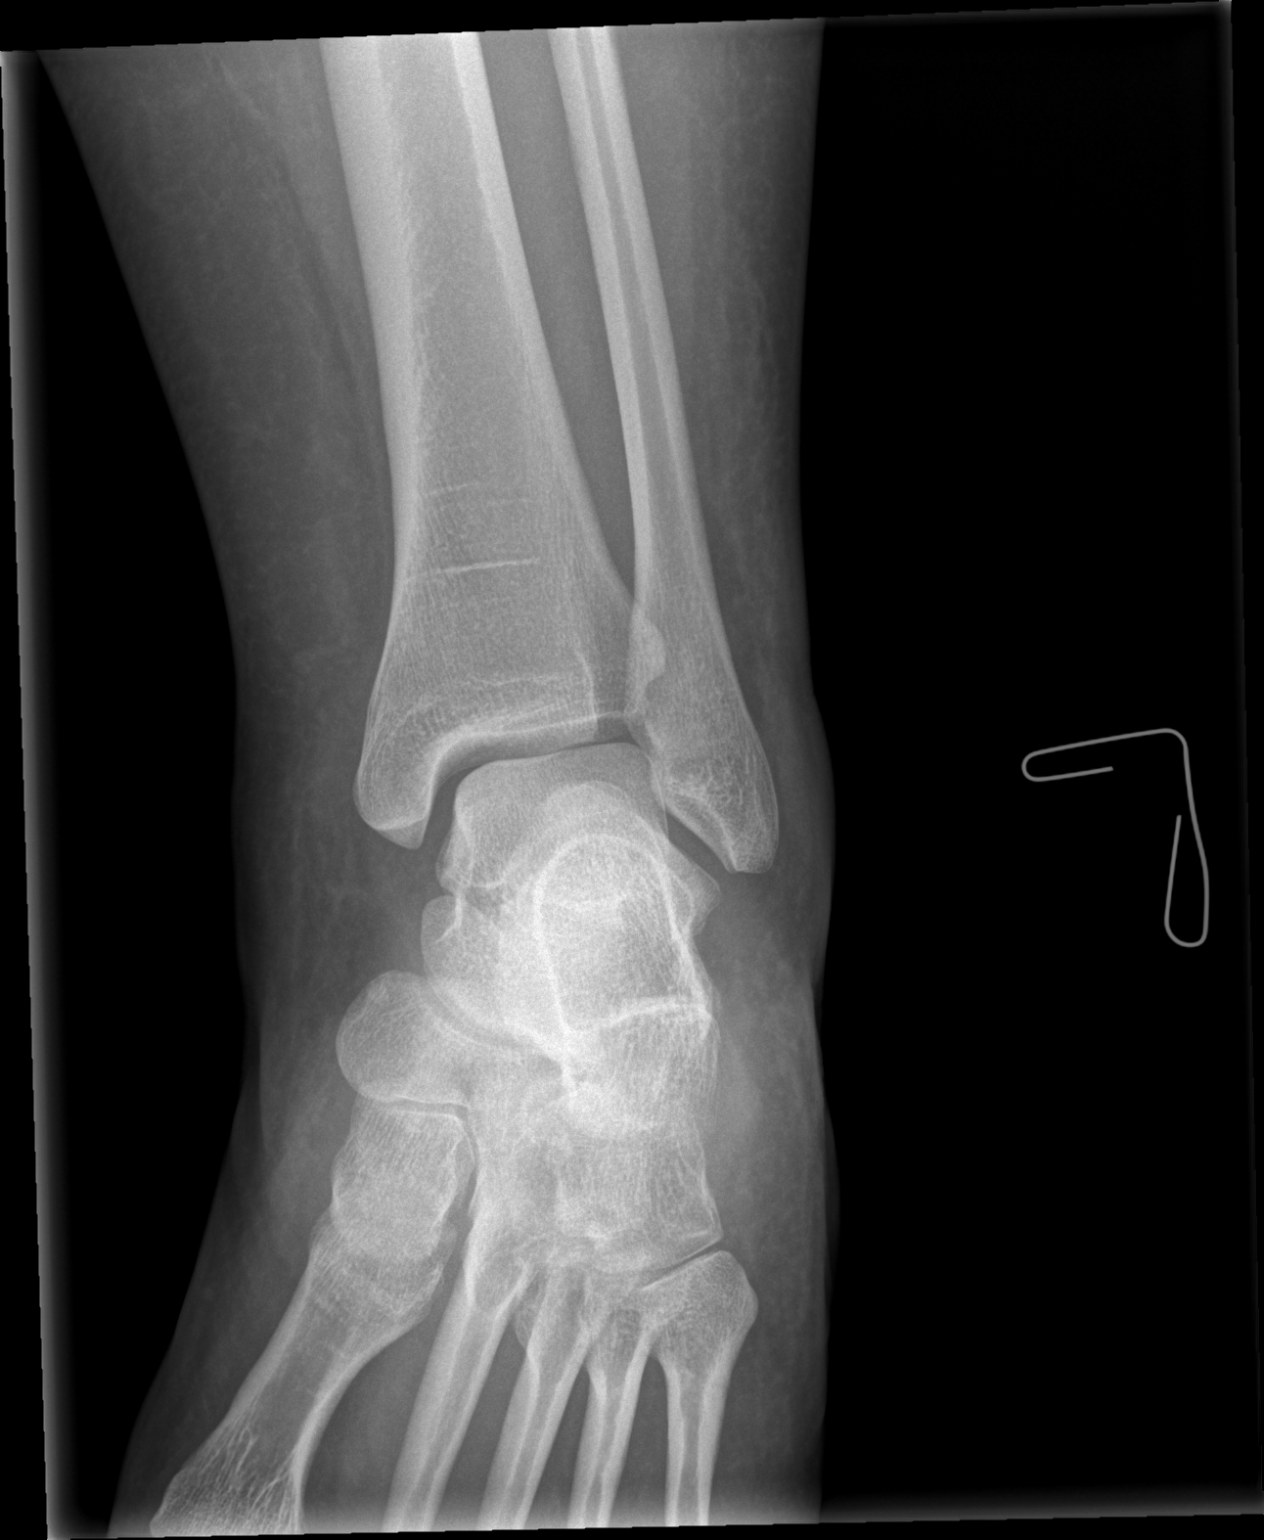

[x ankle obl left]
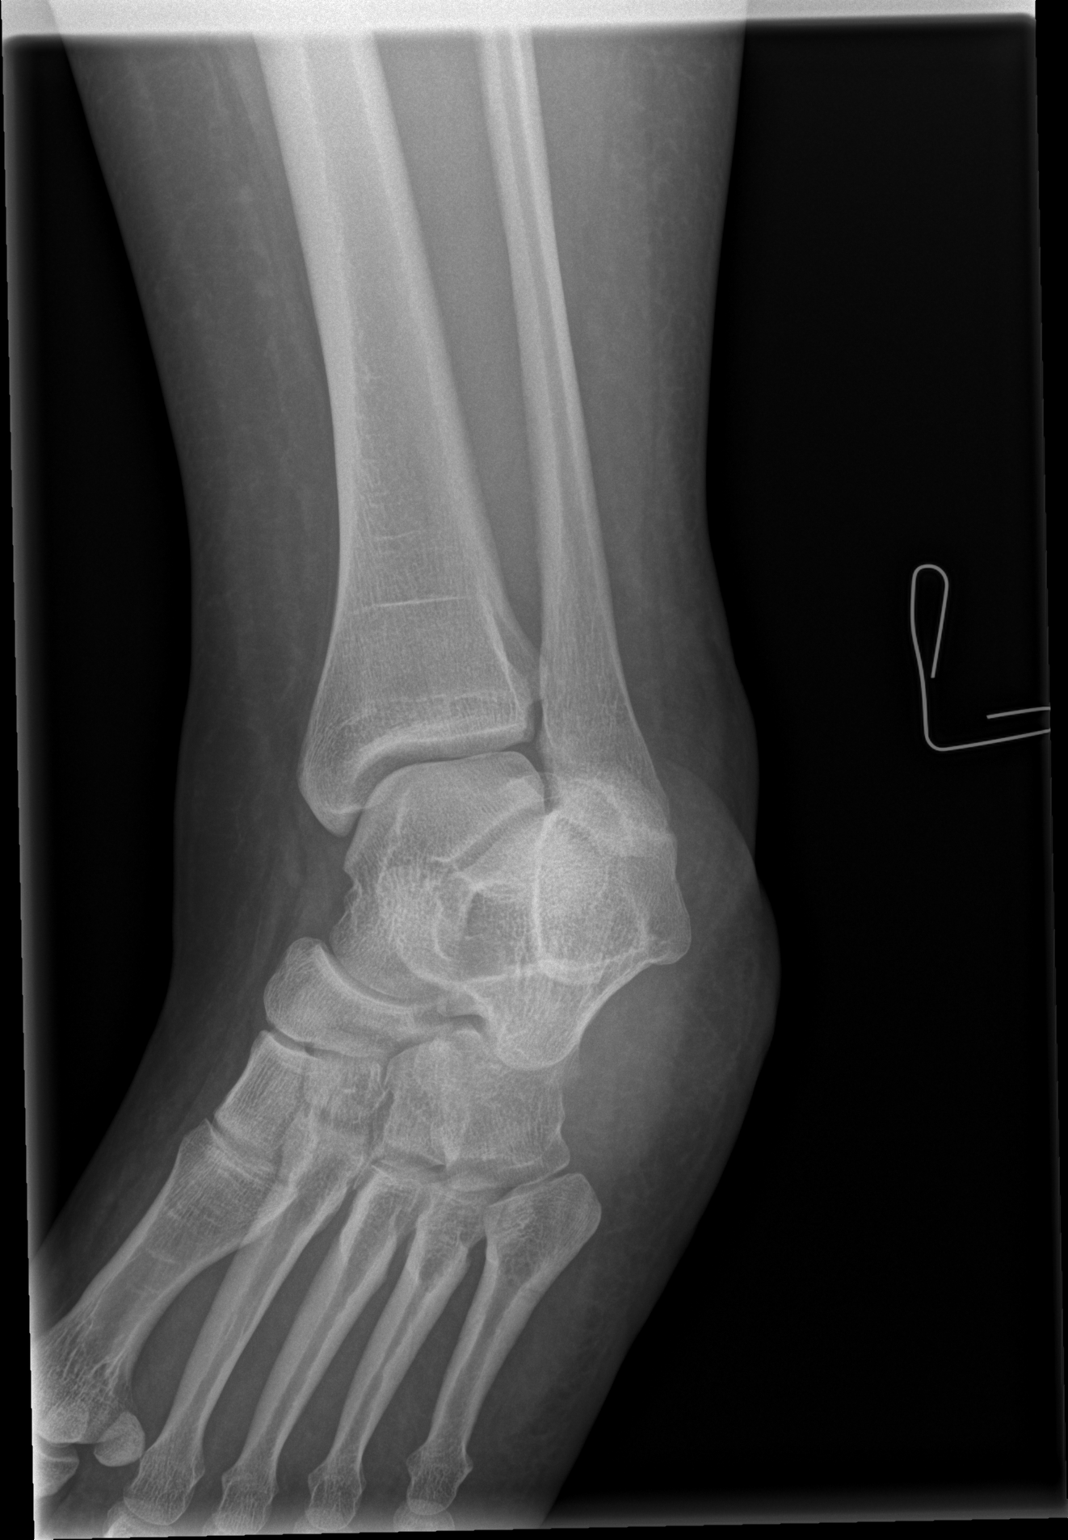

[x ankle lat left]
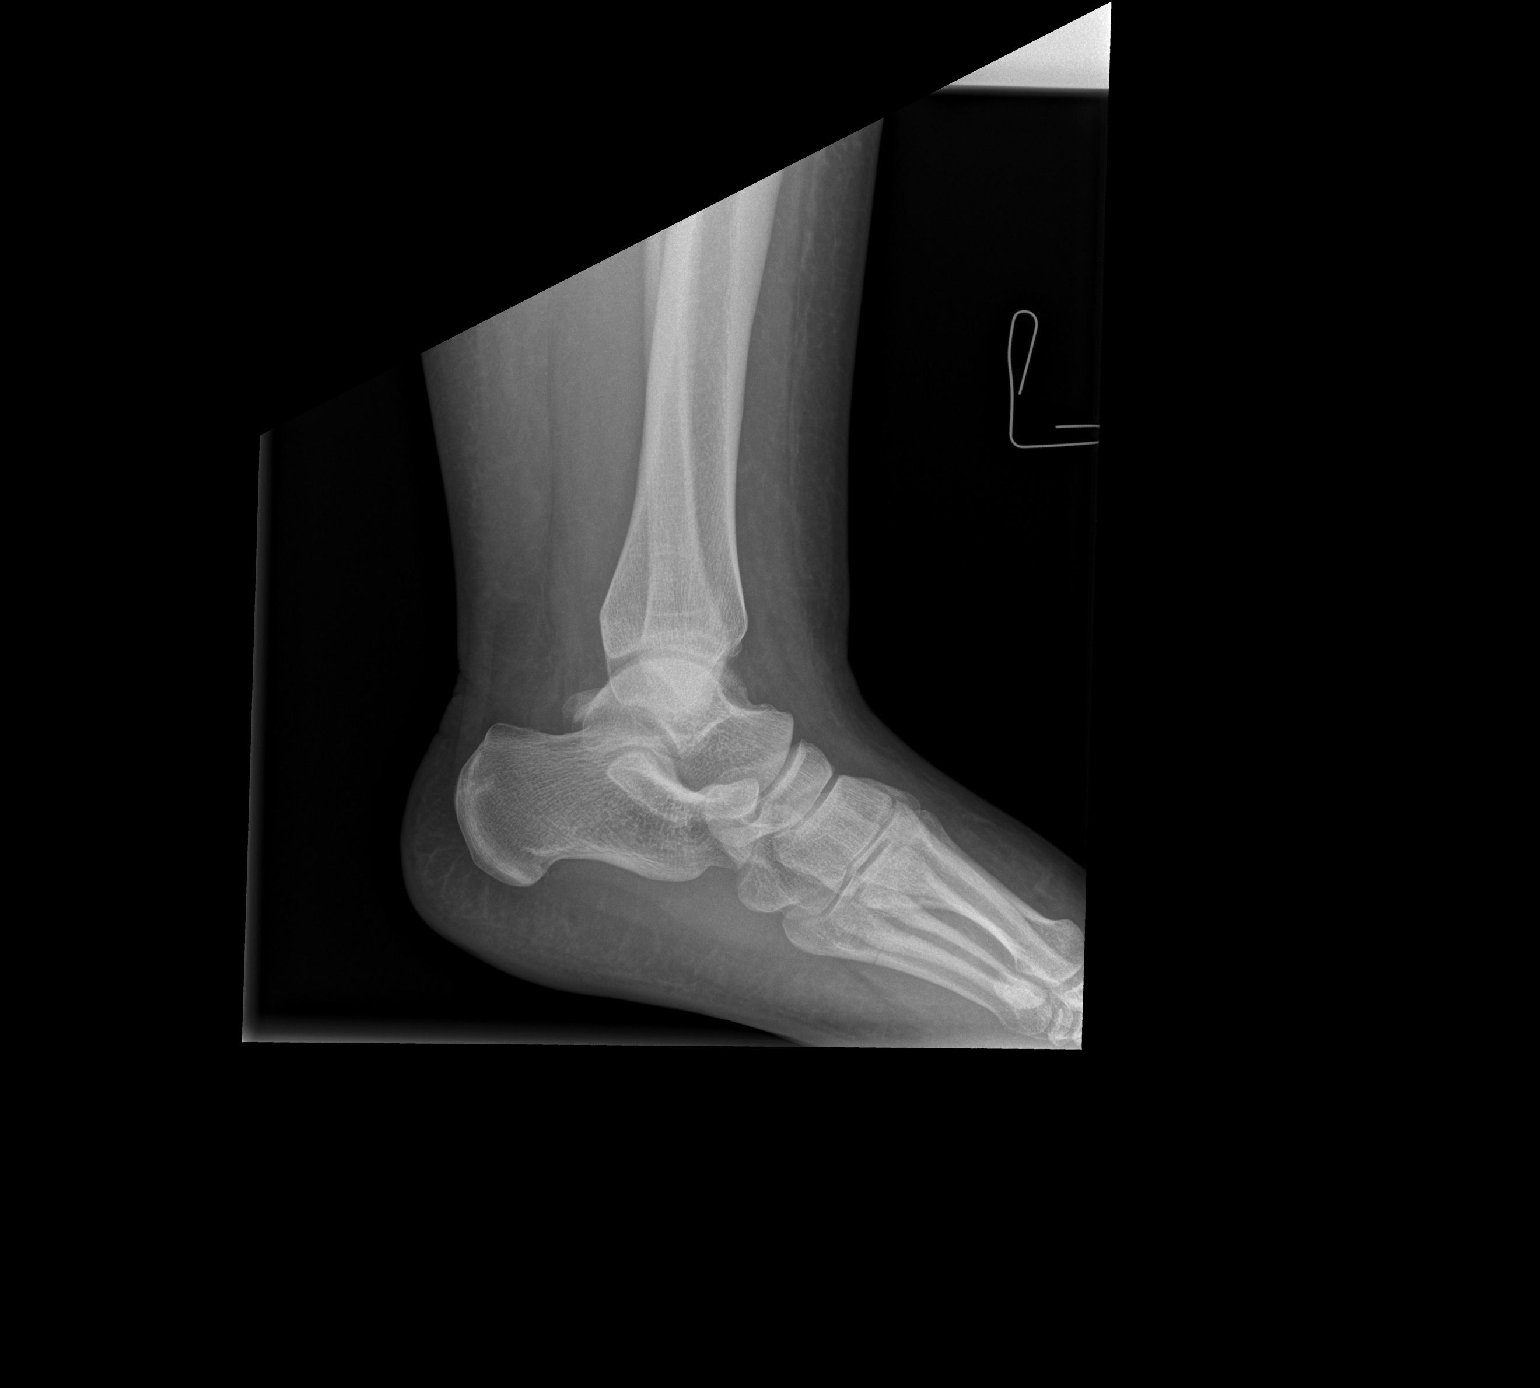

[3 of 3 positions shown; findings below may reference images not displayed]

FINDINGS: There is a fracture through the base of the fifth metatarsal L
better seen on dedicated foot films. No other acute abnormalities.
IMPRESSION: Fracture through the base of the fifth metatarsal. No ankle
fracture.

## 2016-09-02 ENCOUNTER — Emergency Department (HOSPITAL_COMMUNITY): Admission: EM | Admit: 2016-09-02 | Discharge: 2016-09-02 | Discharge status: Against Medical Advice | Payer: Self-pay

## 2016-09-02 NOTE — ED Triage Notes (Signed)
Called X1 for triage, no response.  

## 2016-09-02 NOTE — ED Notes (Signed)
No call for triage x 3, pt moved otf

## 2016-09-02 NOTE — ED Notes (Signed)
Called for triage no answer  

## 2016-09-06 ENCOUNTER — Other Ambulatory Visit (HOSPITAL_COMMUNITY): Payer: Self-pay

## 2016-09-06 ENCOUNTER — Ambulatory Visit (HOSPITAL_COMMUNITY): Payer: Self-pay

## 2016-09-06 ENCOUNTER — Encounter (HOSPITAL_COMMUNITY): Payer: Self-pay | Admitting: *Deleted

## 2016-09-06 ENCOUNTER — Emergency Department (HOSPITAL_COMMUNITY)
Admission: EM | Admit: 2016-09-06 | Discharge: 2016-09-06 | Discharge status: Against Medical Advice | Payer: Medicaid Other | Attending: Emergency Medicine | Admitting: Emergency Medicine

## 2016-09-06 DIAGNOSIS — J45909 Unspecified asthma, uncomplicated: Secondary | ICD-10-CM | POA: Insufficient documentation

## 2016-09-06 DIAGNOSIS — Z79899 Other long term (current) drug therapy: Secondary | ICD-10-CM | POA: Insufficient documentation

## 2016-09-06 DIAGNOSIS — O26891 Other specified pregnancy related conditions, first trimester: Secondary | ICD-10-CM | POA: Insufficient documentation

## 2016-09-06 DIAGNOSIS — R1030 Lower abdominal pain, unspecified: Secondary | ICD-10-CM

## 2016-09-06 DIAGNOSIS — Z3A00 Weeks of gestation of pregnancy not specified: Secondary | ICD-10-CM | POA: Diagnosis not present

## 2016-09-06 DIAGNOSIS — F17210 Nicotine dependence, cigarettes, uncomplicated: Secondary | ICD-10-CM | POA: Diagnosis not present

## 2016-09-06 LAB — URINALYSIS, ROUTINE W REFLEX MICROSCOPIC
BILIRUBIN URINE: NEGATIVE
GLUCOSE, UA: NEGATIVE mg/dL
HGB URINE DIPSTICK: NEGATIVE
Ketones, ur: NEGATIVE mg/dL
LEUKOCYTES UA: NEGATIVE
NITRITE: NEGATIVE
PROTEIN: 30 mg/dL — AB
Specific Gravity, Urine: 1.031 — ABNORMAL HIGH (ref 1.005–1.030)
pH: 5 (ref 5.0–8.0)

## 2016-09-06 LAB — I-STAT BETA HCG BLOOD, ED (MC, WL, AP ONLY): I-stat hCG, quantitative: >2000 m[IU]/mL — ABNORMAL HIGH (ref <5)

## 2016-09-06 LAB — CBC
HCT: 43 % (ref 36.0–46.0)
Hemoglobin: 14.4 g/dL (ref 12.0–15.0)
MCH: 32.1 pg (ref 26.0–34.0)
MCHC: 33.5 g/dL (ref 30.0–36.0)
MCV: 95.8 fL (ref 78.0–100.0)
Platelets: 291 10*3/uL (ref 150–400)
RBC: 4.49 MIL/uL (ref 3.87–5.11)
RDW: 13.5 % (ref 11.5–15.5)
WBC: 7.9 10*3/uL (ref 4.0–10.5)

## 2016-09-06 LAB — COMPREHENSIVE METABOLIC PANEL
ALT: 14 U/L (ref 14–54)
AST: 19 U/L (ref 15–41)
Albumin: 3.8 g/dL (ref 3.5–5.0)
Alkaline Phosphatase: 52 U/L (ref 38–126)
Anion gap: 8 (ref 5–15)
BILIRUBIN TOTAL: 0.8 mg/dL (ref 0.3–1.2)
BUN: 8 mg/dL (ref 6–20)
CHLORIDE: 105 mmol/L (ref 101–111)
CO2: 25 mmol/L (ref 22–32)
CREATININE: 0.91 mg/dL (ref 0.44–1.00)
Calcium: 9.1 mg/dL (ref 8.9–10.3)
GFR calc Af Amer: >60 mL/min (ref >60)
Glucose, Bld: 113 mg/dL — ABNORMAL HIGH (ref 65–99)
Potassium: 3.7 mmol/L (ref 3.5–5.1)
Sodium: 138 mmol/L (ref 135–145)
TOTAL PROTEIN: 6.8 g/dL (ref 6.5–8.1)

## 2016-09-06 NOTE — ED Notes (Signed)
Patient back in lobby, will continue to monitor

## 2016-09-06 NOTE — ED Notes (Signed)
Pt states that she needs to leave as she has no ride home.  Explained to pt risks associated w/ leaving AMA.  Pt verbalized understanding.  E-signed AMA form.  Will inform Dr. Fredderick Phenix.

## 2016-09-06 NOTE — ED Notes (Signed)
Patient seen walking out front door.  Will continue to monitor for possible elopement

## 2016-09-06 NOTE — ED Notes (Signed)
Patient updated on wait time 

## 2016-09-06 NOTE — ED Triage Notes (Signed)
Pt states left mid to lower abdominal cramping for about 1.5 weeks and had 2 positive pregnancy tests.  Denies vaginal discharge or bleeding and thinks LMP about 1 month ago.

## 2016-09-07 NOTE — ED Provider Notes (Signed)
[facility] Provider Note   CSN: [id number] Arrival date & time: 09/06/16  1545     History   Chief Complaint Chief Complaint  Patient presents with  . Abdominal Pain    HPI Mary Davies is a 26 y.o. female.  Patient is a 26 year old female G0 P0 who presents with lower abdominal pain. She's had a positive pregnancy test. Her last menstrual period was about 6 weeks ago. She had a home positive pregnancy test. She complains of some intermittent cramping to her lower abdomen, more on the left side. She denies any vaginal discharge. No vaginal bleeding. She's had some nausea and breast soreness. She denies any urinary symptoms. She has not established prenatal care.      Past Medical History:  Diagnosis Date  . Asthma    prn inhaler  . Fracture of metatarsal bone of left foot 04/22/2015   5th metatarsal  . Left breast abscess 04/2015    Patient Active Problem List   Diagnosis Date Noted  . Left breast abscess 09/24/2012  . Cellulitis 07/14/2012  . Tobacco abuse 07/14/2012    Past Surgical History:  Procedure Laterality Date  . INCISION AND DRAINAGE ABSCESS Left 09/24/2012   Procedure: INCISION AND DRAINAGE BREAST ABSCESS;  Surgeon: Robyne Askew, MD;  Location: [facility] OR;  Service: General;  Laterality: Left;  . INCISION AND DRAINAGE ABSCESS Left 05/08/2015   Procedure: INCISION AND DRAINAGE LEFT BREAST  ABSCESS;  Surgeon: Chevis Pretty III, MD;  Location: [facility];  Service: General;  Laterality: Left;    OB History    Gravida Para Term Preterm AB Living   2       1     SAB TAB Ectopic Multiple Live Births   1               Home Medications    Prior to Admission medications   Medication Sig Start Date End Date Taking? Authorizing Provider  albuterol (PROVENTIL HFA;VENTOLIN HFA) 108 (90 Base) MCG/ACT inhaler Inhale into the lungs every 6 (six) hours as needed for wheezing or shortness of breath.    [provider]  guaiFENesin 200  MG tablet Take 1 tablet (200 mg total) by mouth every 4 (four) hours as needed for cough or to loosen phlegm. 04/17/16   McDonald, Mia A, PA-C  meloxicam (MOBIC) 7.5 MG tablet Take 1 tablet (7.5 mg total) by mouth daily. 04/17/16   McDonald, Mia A, PA-C  naproxen (NAPROSYN) 500 MG tablet Take 1 tablet (500 mg total) by mouth 2 (two) times daily. 04/10/16   Janne Napoleon, NP  predniSONE (DELTASONE) 20 MG tablet Take 2 tablets (40 mg total) by mouth daily with breakfast. For the next four days 07/14/16   Gerhard Munch, MD  sulfamethoxazole-trimethoprim (BACTRIM DS,SEPTRA DS) 800-160 MG tablet Take 1 tablet by mouth 2 (two) times daily. 05/11/16   Street, [place], PA-C    Family History Family History  Problem Relation Age of Onset  . Diabetes Mother     Social History Social History  Substance Use Topics  . Smoking status: Current Every Day Smoker    Packs/day: 0.50    Years: 10.00    Types: Cigarettes  . Smokeless tobacco: Never Used  . Alcohol use No     Allergies   Pineapple   Review of Systems Review of Systems  Constitutional: Positive for fatigue. Negative for chills, diaphoresis and fever.  HENT: Negative for congestion, rhinorrhea and sneezing.  Eyes: Negative.   Respiratory: Negative for cough, chest tightness and shortness of breath.   Cardiovascular: Negative for chest pain and leg swelling.  Gastrointestinal: Positive for abdominal pain and nausea. Negative for blood in stool, diarrhea and vomiting.  Genitourinary: Negative for difficulty urinating, flank pain, frequency and hematuria.  Musculoskeletal: Negative for arthralgias and back pain.  Skin: Negative for rash.  Neurological: Negative for dizziness, speech difficulty, weakness, numbness and headaches.     Physical Exam Updated Vital Signs BP 131/86   Pulse 73   Temp 98.4 F (36.9 C) (Oral)   Resp 20   SpO2 96%   Physical Exam  Constitutional: She is oriented to person, place, and time. She appears  well-developed and well-nourished.  HENT:  Head: Normocephalic and atraumatic.  Eyes: Pupils are equal, round, and reactive to light.  Neck: Normal range of motion. Neck supple.  Cardiovascular: Normal rate, regular rhythm and normal heart sounds.   Pulmonary/Chest: Effort normal and breath sounds normal. No respiratory distress. She has no wheezes. She has no rales. She exhibits no tenderness.  Abdominal: Soft. Bowel sounds are normal. There is no tenderness. There is no rebound and no guarding.  Musculoskeletal: Normal range of motion. She exhibits no edema.  Lymphadenopathy:    She has no cervical adenopathy.  Neurological: She is alert and oriented to person, place, and time.  Skin: Skin is warm and dry. No rash noted.  Psychiatric: She has a normal mood and affect.     ED Treatments / Results  Labs (all labs ordered are listed, but only abnormal results are displayed) Labs Reviewed  COMPREHENSIVE METABOLIC PANEL - Abnormal; Notable for the following:       Result Value   Glucose, Bld 113 (*)    All other components within normal limits  URINALYSIS, ROUTINE W REFLEX MICROSCOPIC - Abnormal; Notable for the following:    APPearance HAZY (*)    Specific Gravity, Urine 1.031 (*)    Protein, ur 30 (*)    Bacteria, UA RARE (*)    Squamous Epithelial / LPF 6-30 (*)    All other components within normal limits  I-STAT BETA HCG BLOOD, ED ([facility], [facility], [facility] ONLY) - Abnormal; Notable for the following:    I-stat hCG, quantitative >2,000.0 (*)    All other components within normal limits  WET PREP, GENITAL  CBC  GC/CHLAMYDIA PROBE AMP ([place]) NOT AT [facility]    EKG  EKG Interpretation None       Radiology No results found.  Procedures Procedures (including critical care time)  Medications Ordered in ED Medications - No data to display   Initial Impression / Assessment and Plan / ED Course  I have reviewed the triage vital signs and the nursing notes.  Pertinent labs &  imaging results that were available during my care of the patient were reviewed by me and considered in my medical decision making (see chart for details).     Patient is a 26 year old female who presents with lower abdominal pain and a positive patency test. I did order a pelvic set as well as a pelvic ultrasounds. However patient left AMA prior to completion of treatment.  Final Clinical Impressions(s) / ED Diagnoses   Final diagnoses:  Lower abdominal pain    New Prescriptions Discharge Medication List as of 09/06/2016 10:54 PM       Rolan Bucco, MD 09/07/16 1506

## 2016-09-09 ENCOUNTER — Emergency Department (HOSPITAL_COMMUNITY)
Admission: EM | Admit: 2016-09-09 | Discharge: 2016-09-09 | Disposition: A | Discharge status: Home / Self Care | Payer: Medicaid Other | Attending: Emergency Medicine | Admitting: Emergency Medicine

## 2016-09-09 ENCOUNTER — Emergency Department (HOSPITAL_COMMUNITY): Payer: Medicaid Other

## 2016-09-09 ENCOUNTER — Encounter (HOSPITAL_COMMUNITY): Payer: Self-pay | Admitting: Emergency Medicine

## 2016-09-09 DIAGNOSIS — J45909 Unspecified asthma, uncomplicated: Secondary | ICD-10-CM | POA: Diagnosis not present

## 2016-09-09 DIAGNOSIS — Z79899 Other long term (current) drug therapy: Secondary | ICD-10-CM | POA: Diagnosis not present

## 2016-09-09 DIAGNOSIS — O9952 Diseases of the respiratory system complicating childbirth: Secondary | ICD-10-CM | POA: Diagnosis not present

## 2016-09-09 DIAGNOSIS — F17210 Nicotine dependence, cigarettes, uncomplicated: Secondary | ICD-10-CM | POA: Insufficient documentation

## 2016-09-09 DIAGNOSIS — O9989 Other specified diseases and conditions complicating pregnancy, childbirth and the puerperium: Secondary | ICD-10-CM | POA: Diagnosis not present

## 2016-09-09 DIAGNOSIS — R109 Unspecified abdominal pain: Secondary | ICD-10-CM | POA: Diagnosis present

## 2016-09-09 DIAGNOSIS — Z3A01 Less than 8 weeks gestation of pregnancy: Secondary | ICD-10-CM | POA: Insufficient documentation

## 2016-09-09 DIAGNOSIS — O99331 Smoking (tobacco) complicating pregnancy, first trimester: Secondary | ICD-10-CM | POA: Insufficient documentation

## 2016-09-09 DIAGNOSIS — Z3490 Encounter for supervision of normal pregnancy, unspecified, unspecified trimester: Secondary | ICD-10-CM

## 2016-09-09 NOTE — ED Triage Notes (Signed)
States was seen on the 24 and was told she was preg  And that she needed a [place] and a pelvic but she left and  n ow she wants to have all done

## 2016-09-09 NOTE — ED Provider Notes (Signed)
[facility] Provider Note   CSN: [id number] Arrival date & time: 09/09/16  1346     History   Chief Complaint Chief Complaint  Patient presents with  . Abdominal Pain    HPI Mary Davies is a 26 y.o. female.  HPI 26 year old female who presents today stating that she was seen on Friday and told that she was pregnant but did not stay for discharge instructions. Since she came in on Friday because she was having some crampy abdominal pain. That has since resolved. She states her last menstrual period was approximately 2 months ago and she has been not using any birth control. She denies any vaginal discharge, urinary tract infection symptoms, or other problems at this time. She does endorse some breast tenderness and nausea. She has not been pregnant in the past. Some performed prior to my evaluation shows an 5 weeks 4 days. intrauterine gestation Past Medical History:  Diagnosis Date  . Asthma    prn inhaler  . Fracture of metatarsal bone of left foot 04/22/2015   5th metatarsal  . Left breast abscess 04/2015    Patient Active Problem List   Diagnosis Date Noted  . Left breast abscess 09/24/2012  . Cellulitis 07/14/2012  . Tobacco abuse 07/14/2012    Past Surgical History:  Procedure Laterality Date  . INCISION AND DRAINAGE ABSCESS Left 09/24/2012   Procedure: INCISION AND DRAINAGE BREAST ABSCESS;  Surgeon: Robyne Askew, MD;  Location: [facility];  Service: General;  Laterality: Left;  . INCISION AND DRAINAGE ABSCESS Left 05/08/2015   Procedure: INCISION AND DRAINAGE LEFT BREAST  ABSCESS;  Surgeon: Chevis Pretty III, MD;  Location: [facility];  Service: General;  Laterality: Left;    OB History    Gravida Para Term Preterm AB Living   2       1     SAB TAB Ectopic Multiple Live Births   1               Home Medications    Prior to Admission medications   Medication Sig Start Date End Date Taking? Authorizing Provider  albuterol (PROVENTIL  HFA;VENTOLIN HFA) 108 (90 Base) MCG/ACT inhaler Inhale into the lungs every 6 (six) hours as needed for wheezing or shortness of breath.    [provider]  guaiFENesin 200 MG tablet Take 1 tablet (200 mg total) by mouth every 4 (four) hours as needed for cough or to loosen phlegm. 04/17/16   McDonald, Mia A, PA-C  meloxicam (MOBIC) 7.5 MG tablet Take 1 tablet (7.5 mg total) by mouth daily. 04/17/16   McDonald, Mia A, PA-C  naproxen (NAPROSYN) 500 MG tablet Take 1 tablet (500 mg total) by mouth 2 (two) times daily. 04/10/16   Janne Napoleon, NP  predniSONE (DELTASONE) 20 MG tablet Take 2 tablets (40 mg total) by mouth daily with breakfast. For the next four days 07/14/16   Gerhard Munch, MD  sulfamethoxazole-trimethoprim (BACTRIM DS,SEPTRA DS) 800-160 MG tablet Take 1 tablet by mouth 2 (two) times daily. 05/11/16   Street, [place], PA-C    Family History Family History  Problem Relation Age of Onset  . Diabetes Mother     Social History Social History  Substance Use Topics  . Smoking status: Current Every Day Smoker    Packs/day: 0.50    Years: 10.00    Types: Cigarettes  . Smokeless tobacco: Never Used  . Alcohol use No     Allergies   Pineapple  Review of Systems Review of Systems   Physical Exam Updated Vital Signs BP 131/84   Pulse 71   Temp 98.7 F (37.1 C) (Oral)   Resp 18   Ht 1.575 m (5\' 2" )   Wt 99.8 kg (220 lb)   LMP 07/07/2016   SpO2 97%   BMI 40.24 kg/m   Physical Exam  Constitutional: She is oriented to person, place, and time. She appears well-developed and well-nourished. No distress.  HENT:  Head: Normocephalic and atraumatic.  Right Ear: External ear normal.  Left Ear: External ear normal.  Nose: Nose normal.  Eyes: Pupils are equal, round, and reactive to light. Conjunctivae and EOM are normal.  Neck: Normal range of motion. Neck supple.  Pulmonary/Chest: Effort normal.  Abdominal: Soft. Bowel sounds are normal. She exhibits no  distension. There is no tenderness.  Musculoskeletal: Normal range of motion.  Neurological: She is alert and oriented to person, place, and time. She exhibits normal muscle tone. Coordination normal.  Skin: Skin is warm and dry.  Psychiatric: She has a normal mood and affect. Her behavior is normal. Thought content normal.  Nursing note and vitals reviewed.    ED Treatments / Results  Labs (all labs ordered are listed, but only abnormal results are displayed) Labs Reviewed - No data to display  EKG  EKG Interpretation None       Radiology US Ob Comp Less 14 Wks  Result Date: 09/09/2016 CLINICAL DATA:  Pelvic pain for 3 days. EXAM: OBSTETRIC <14 WK [place] AND TRANSVAGINAL OB US TECHNIQUE: Both transabdominal and transvaginal ultrasound examinations were performed for complete evaluation of the gestation as well as the maternal uterus, adnexal regions, and pelvic cul-de-sac. Transvaginal technique was performed to assess early pregnancy. COMPARISON:  None. FINDINGS: Intrauterine gestational sac: Single with appropriate surrounding decidual reaction. Yolk sac:  Not seen Embryo:  Not seen Cardiac Activity: Not seen MSD: 8  mm   5 w   4  d CRL:    mm    w    d                  [place] EDC: Subchorionic hemorrhage:  None visualized. Maternal uterus/adnexae: Hypoechoic area within the anterior uterus, measuring 1.4 cm, probable small fibroid. Maternal right ovary appears normal and there is no mass or free fluid identified in the right adnexal region. Maternal left ovary is not seen but there is no mass or free fluid identified in the left adnexal region. No free fluid in the cul-de-sac. IMPRESSION: 1. Single intrauterine gestational sac with estimated gestational age of [redacted] weeks and 4 days based on gestational sac size. No fetal pole or yolk sac is identified as yet. Recommend follow-up with serial beta HCG levels and obstetric ultrasound as needed to ensure normal pregnancy. 2. Probable small uterine  fibroid, measuring 1.4 cm. 3. No adnexal abnormality identified. Electronically Signed   By: Bary Richard M.D.   On: 09/09/2016 16:15   US Ob Transvaginal  Result Date: 09/09/2016 CLINICAL DATA:  Pelvic pain for 3 days EXAM: OBSTETRIC <14 WK ULTRASOUND TECHNIQUE: Transabdominal ultrasound was performed for evaluation of the gestation as well as the maternal uterus and adnexal regions. COMPARISON:  None. FINDINGS: Intrauterine gestational sac: Single Yolk sac:  Not Visualized. Embryo:  Not Visualized. Cardiac Activity: Not Visualized. MSD: 8.1  mm   5 w   4  d Subchorionic hemorrhage:  None visualized. Maternal uterus/adnexae: No adnexal mass. No pelvic free fluid. 14  x 10 x 13 mm hypoechoic anterior uterine mass likely reflecting a small leiomyoma. IMPRESSION: 1. Single intrauterine gestational sac without a fetal pole. Probable early intrauterine gestational sac, but no yolk sac, fetal pole, or cardiac activity yet visualized. Recommend follow-up quantitative B-HCG levels and follow-up US in 14 days to assess viability. This recommendation follows SRU consensus guidelines: Diagnostic Criteria for Nonviable Pregnancy Early in the First Trimester. Malva Limes Med 2013; 161:0960-45. Electronically Signed   By: Elige Ko   On: 09/09/2016 16:22    Procedures Procedures (including critical care time)  Medications Ordered in ED Medications - No data to display   Initial Impression / Assessment and Plan / ED Course  I have reviewed the triage vital signs and the nursing notes.  Pertinent labs & imaging results that were available during my care of the patient were reviewed by me and considered in my medical decision making (see chart for details).       Final Clinical Impressions(s) / ED Diagnoses   Final diagnoses:  Pregnancy, unspecified gestational age    [place] Prescriptions New Prescriptions   No medications on file     Margarita Grizzle, MD 09/09/16 1747

## 2016-09-09 NOTE — Discharge Instructions (Signed)
Call the [facility] for follow up prenatal care.  Return to [facility] hospital for any pain or bleeding.

## 2016-10-23 ENCOUNTER — Other Ambulatory Visit (HOSPITAL_COMMUNITY)
Admission: RE | Admit: 2016-10-23 | Discharge: 2016-10-23 | Disposition: A | Discharge status: Home / Self Care | Payer: Medicaid Other | Source: Ambulatory Visit | Attending: Advanced Practice Midwife | Admitting: Advanced Practice Midwife

## 2016-10-23 ENCOUNTER — Ambulatory Visit: Payer: Self-pay

## 2016-10-23 ENCOUNTER — Ambulatory Visit (INDEPENDENT_AMBULATORY_CARE_PROVIDER_SITE_OTHER): Payer: Medicaid Other | Admitting: Advanced Practice Midwife

## 2016-10-23 ENCOUNTER — Encounter: Payer: Self-pay | Admitting: Advanced Practice Midwife

## 2016-10-23 VITALS — BP 121/58 | HR 58 | Wt 217.0 lb

## 2016-10-23 DIAGNOSIS — Z3A11 11 weeks gestation of pregnancy: Secondary | ICD-10-CM | POA: Diagnosis not present

## 2016-10-23 DIAGNOSIS — Z113 Encounter for screening for infections with a predominantly sexual mode of transmission: Secondary | ICD-10-CM

## 2016-10-23 DIAGNOSIS — Z368A Encounter for antenatal screening for other genetic defects: Secondary | ICD-10-CM

## 2016-10-23 DIAGNOSIS — O3680X0 Pregnancy with inconclusive fetal viability, not applicable or unspecified: Secondary | ICD-10-CM | POA: Diagnosis present

## 2016-10-23 DIAGNOSIS — Z3480 Encounter for supervision of other normal pregnancy, unspecified trimester: Secondary | ICD-10-CM

## 2016-10-23 DIAGNOSIS — Z720 Tobacco use: Secondary | ICD-10-CM

## 2016-10-23 DIAGNOSIS — A5602 Chlamydial vulvovaginitis: Secondary | ICD-10-CM | POA: Insufficient documentation

## 2016-10-23 DIAGNOSIS — Z3400 Encounter for supervision of normal first pregnancy, unspecified trimester: Secondary | ICD-10-CM | POA: Diagnosis present

## 2016-10-23 DIAGNOSIS — O98311 Other infections with a predominantly sexual mode of transmission complicating pregnancy, first trimester: Secondary | ICD-10-CM | POA: Diagnosis not present

## 2016-10-23 DIAGNOSIS — O0990 Supervision of high risk pregnancy, unspecified, unspecified trimester: Secondary | ICD-10-CM | POA: Insufficient documentation

## 2016-10-23 DIAGNOSIS — Z23 Encounter for immunization: Secondary | ICD-10-CM | POA: Diagnosis not present

## 2016-10-23 LAB — POCT URINALYSIS DIP (DEVICE)
BILIRUBIN URINE: NEGATIVE
Glucose, UA: NEGATIVE mg/dL
Hgb urine dipstick: NEGATIVE
Ketones, ur: NEGATIVE mg/dL
LEUKOCYTES UA: NEGATIVE
NITRITE: NEGATIVE
PH: 6 (ref 5.0–8.0)
Protein, ur: NEGATIVE mg/dL
Specific Gravity, Urine: 1.02 (ref 1.005–1.030)
UROBILINOGEN UA: 0.2 mg/dL (ref 0.0–1.0)

## 2016-10-23 NOTE — Patient Instructions (Addendum)
Smoking During Pregnancy Smoking during pregnancy is unhealthy for you and your baby. Smoke from cigarettes, pipes, and cigars contains many chemicals that can cause cancer (carcinogens). Cigarettes also contain a stimulant drug (nicotine). When you smoke, harmful substances that you breathe in enter your bloodstream and can be passed on to your baby. This can affect your baby's development. If you are planning to become pregnant or have recently become pregnant, talk with your health care provider about quitting smoking. How does smoking affect me? Smoking increases your risk for many long-term (chronic) diseases. These diseases include cancer, lung diseases, and heart disease. Smoking during pregnancy increases your risk of:  Losing the pregnancy (miscarriage or stillbirth).  Giving birth too early (premature birth).  Pregnancy outside of the uterus (tubal pregnancy).  Having problems with the organ that provides the baby nourishment and oxygen (placenta), including: ? Attachment of the placenta over the opening of the uterus (placenta previa). ? Detachment of the placenta before the baby's birth (placental abruption).  Having your water break before labor begins (premature rupture of membranes).  How does smoking affect my baby? Before Birth Smoking during pregnancy:  Decreases blood flow and oxygen to your baby.  Increases your baby's risk of birth defects, such as heart defects.  Increases your baby's heart rate.  Slows your baby's growth in the uterus (intrauterine growth retardation).  After Birth Babies born to women who smoked during pregnancy may:  Have symptoms of nicotine withdrawal.  Need to stay in the hospital for special care.  May be too small at birth.  Have a high risk of: ? Serious health problems or lifelong disabilities. ? Sudden infant death syndrome (SIDS). ? Becoming obese. ? Developing behavior or learning problems.  What can happen if changes  are not made? When babies are born with a birth defect or illness, they often need to stay in the hospital longer before going home. Hospital stays may also be longer if you had any complications during labor or delivery. Longer hospital stays and more treatments result in higher costs for health care. Many health issues among babies born to mothers who smoke can have a lifelong impact. This may include the long-term need for certain medicines, therapies, or other treatments. What are the benefits of not smoking during pregnancy? You have a much better chance of having a healthy pregnancy and a healthy baby if you do not smoke while you are pregnant. Not smoking also means that you will have a better chance of living a long and healthy life, and your baby will have a better chance of growing into a healthy child and adult. What actions can be taken? Quitting smoking can be difficult. Ask your health care provider for help to stop smoking. You may also consider:  Counseling to help you quit smoking (smoking cessation counseling).  Psychotherapy.  Acupuncture.  Hypnosis.  Telephone [employer].  If these methods do not help you, talk with your health care provider about other options. Do not take smoking cessation medicines or nicotine supplements unless your health care provider tells you to. Where to find more information: Learn more about smoking during pregnancy and quitting smoking from:  [employer] of [employer]: www.marchofdimes.org/pregnancy/smoking-during-pregnancy.aspx  [place].S. Department of Health and Human Services: women.smokefree.gov  American Cancer Society: www.cancer.org  American Heart Association: www.heart.org  National Cancer Institute: www.cancer.gov  For help to quit smoking:  National smoking cessation telephone hotline: 1-800-QUIT NOW 7817985060)  Contact a health care provider if:  You are struggling  to quit smoking.  You are a smoker and you become pregnant or  plan to become pregnant.  You start smoking again after giving birth. Summary  Tobacco smoke contains harmful substances that can affect a baby's health and development.  Smoking increases the risk for serious problems, such as miscarriage, birth defects, or premature birth.  If you need help to quit smoking, ask your health care provider. This information is not intended to replace advice given to you by your health care provider. Make sure you discuss any questions you have with your health care provider. Document Released: 05/14/2004 Document Revised: 10/20/2015 Document Reviewed: 10/20/2015 Elsevier Interactive Patient Education  2018 [employer].   Prenatal Care WHAT IS PRENATAL CARE? Prenatal care is the process of caring for a pregnant woman before she gives birth. Prenatal care makes sure that she and her baby remain as healthy as possible throughout pregnancy. Prenatal care may be provided by a midwife, family practice health care provider, or a childbirth and pregnancy specialist (obstetrician). Prenatal care may include physical examinations, testing, treatments, and education on nutrition, lifestyle, and social support services. WHY IS PRENATAL CARE SO IMPORTANT? Early and consistent prenatal care increases the chance that you and your baby will remain healthy throughout your pregnancy. This type of care also decreases a baby's risk of being born too early (prematurely), or being born smaller than expected (small for gestational age). Any underlying medical conditions you may have that could pose a risk during your pregnancy are discussed during prenatal care visits. You will also be monitored regularly for any new conditions that may arise during your pregnancy so they can be treated quickly and effectively. WHAT HAPPENS DURING PRENATAL CARE VISITS? Prenatal care visits may include the following: Discussion Tell your health care provider about any new signs or symptoms you have  experienced since your last visit. These might include:  Nausea or vomiting.  Increased or decreased level of energy.  Difficulty sleeping.  Back or leg pain.  Weight changes.  Frequent urination.  Shortness of breath with physical activity.  Changes in your skin, such as the development of a rash or itchiness.  Vaginal discharge or bleeding.  Feelings of excitement or nervousness.  Changes in your baby's movements.  You may want to write down any questions or topics you want to discuss with your health care provider and bring them with you to your appointment. Examination During your first prenatal care visit, you will likely have a complete physical exam. Your health care provider will often examine your vagina, cervix, and the position of your uterus, as well as check your heart, lungs, and other body systems. As your pregnancy progresses, your health care provider will measure the size of your uterus and your baby's position inside your uterus. He or she may also examine you for early signs of labor. Your prenatal visits may also include checking your blood pressure and, after about 10-12 weeks of pregnancy, listening to your baby's heartbeat. Testing Regular testing often includes:  Urinalysis. This checks your urine for glucose, protein, or signs of infection.  Blood count. This checks the levels of white and red blood cells in your body.  Tests for sexually transmitted infections (STIs). Testing for STIs at the beginning of pregnancy is routinely done and is required in many states.  Antibody testing. You will be checked to see if you are immune to certain illnesses, such as rubella, that can affect a developing fetus.  Glucose screen. Around 24-28  weeks of pregnancy, your blood glucose level will be checked for signs of gestational diabetes. Follow-up tests may be recommended.  Group B strep. This is a bacteria that is commonly found inside a woman's vagina. This test  will inform your health care provider if you need an antibiotic to reduce the amount of this bacteria in your body prior to labor and childbirth.  Ultrasound. Many pregnant women undergo an ultrasound screening around 18-20 weeks of pregnancy to evaluate the health of the fetus and check for any developmental abnormalities.  HIV (human immunodeficiency virus) testing. Early in your pregnancy, you will be screened for HIV. If you are at high risk for HIV, this test may be repeated during your third trimester of pregnancy.  You may be offered other testing based on your age, personal or family medical history, or other factors. HOW OFTEN SHOULD I PLAN TO SEE MY HEALTH CARE PROVIDER FOR PRENATAL CARE? Your prenatal care check-up schedule depends on any medical conditions you have before, or develop during, your pregnancy. If you do not have any underlying medical conditions, you will likely be seen for checkups:  Monthly, during the first 6 months of pregnancy.  Twice a month during months 7 and 8 of pregnancy.  Weekly starting in the 9th month of pregnancy and until delivery.  If you develop signs of early labor or other concerning signs or symptoms, you may need to see your health care provider more often. Ask your health care provider what prenatal care schedule is best for you. WHAT CAN I DO TO KEEP MYSELF AND MY BABY AS HEALTHY AS POSSIBLE DURING MY PREGNANCY?  Take a prenatal vitamin containing 400 micrograms (0.4 mg) of folic acid every day. Your health care provider may also ask you to take additional vitamins such as iodine, vitamin D, iron, copper, and zinc.  Take 1500-2000 mg of calcium daily starting at your 20th week of pregnancy until you deliver your baby.  Make sure you are up to date on your vaccinations. Unless directed otherwise by your health care provider: ? You should receive a tetanus, diphtheria, and pertussis (Tdap) vaccination between the 27th and 36th week of your  pregnancy, regardless of when your last Tdap immunization occurred. This helps protect your baby from whooping cough (pertussis) after he or she is born. ? You should receive an annual inactivated influenza vaccine (IIV) to help protect you and your baby from influenza. This can be done at any point during your pregnancy.  Eat a well-rounded diet that includes: ? Fresh fruits and vegetables. ? Lean proteins. ? Calcium-rich foods such as milk, yogurt, hard cheeses, and dark, leafy greens. ? Whole grain breads.  Do noteat seafood high in mercury, including: ? Swordfish. ? Tilefish. ? Shark. ? King mackerel. ? More than 6 oz tuna per week.  Do not eat: ? Raw or undercooked meats or eggs. ? Unpasteurized foods, such as soft cheeses (brie, blue, or feta), juices, and milks. ? Lunch meats. ? Hot dogs that have not been heated until they are steaming.  Drink enough water to keep your urine clear or pale yellow. For many women, this may be 10 or more 8 oz glasses of water each day. Keeping yourself hydrated helps deliver nutrients to your baby and may prevent the start of pre-term uterine contractions.  Do not use any tobacco products including cigarettes, chewing tobacco, or electronic cigarettes. If you need help quitting, ask your health care provider.  Do not drink beverages  containing alcohol. No safe level of alcohol consumption during pregnancy has been determined.  Do not use any illegal drugs. These can harm your developing baby or cause a miscarriage.  Ask your health care provider or pharmacist before taking any prescription or over-the-counter medicines, herbs, or supplements.  Limit your caffeine intake to no more than 200 mg per day.  Exercise. Unless told otherwise by your health care provider, try to get 30 minutes of moderate exercise most days of the week. Do not  do high-impact activities, contact sports, or activities with a high risk of falling, such as horseback  riding or downhill skiing.  Get plenty of rest.  Avoid anything that raises your body temperature, such as hot tubs and saunas.  If you own a cat, do not empty its litter box. Bacteria contained in cat feces can cause an infection called toxoplasmosis. This can result in serious harm to the fetus.  Stay away from chemicals such as insecticides, lead, mercury, and cleaning or paint products that contain solvents.  Do not have any X-rays taken unless medically necessary.  Take a childbirth and breastfeeding preparation class. Ask your health care provider if you need a referral or recommendation.  This information is not intended to replace advice given to you by your health care provider. Make sure you discuss any questions you have with your health care provider. Document Released: 01/03/2003 Document Revised: 06/05/2015 Document Reviewed: 03/17/2013 Elsevier Interactive Patient Education  2017 [employer].   Breastfeeding Deciding to breastfeed is one of the best choices you can make for you and your baby. A change in hormones during pregnancy causes your breast tissue to grow and increases the number and size of your milk ducts. These hormones also allow proteins, sugars, and fats from your blood supply to make breast milk in your milk-producing glands. Hormones prevent breast milk from being released before your baby is born as well as prompt milk flow after birth. Once breastfeeding has begun, thoughts of your baby, as well as his or her sucking or crying, can stimulate the release of milk from your milk-producing glands. Benefits of breastfeeding For Your Baby  Your first milk (colostrum) helps your baby's digestive system function better.  There are antibodies in your milk that help your baby fight off infections.  Your baby has a lower incidence of asthma, allergies, and sudden infant death syndrome.  The nutrients in breast milk are better for your baby than infant formulas  and are designed uniquely for your baby's needs.  Breast milk improves your baby's brain development.  Your baby is less likely to develop other conditions, such as childhood obesity, asthma, or type 2 diabetes mellitus.  For You  Breastfeeding helps to create a very special bond between you and your baby.  Breastfeeding is convenient. Breast milk is always available at the correct temperature and costs nothing.  Breastfeeding helps to burn calories and helps you lose the weight gained during pregnancy.  Breastfeeding makes your uterus contract to its prepregnancy size faster and slows bleeding (lochia) after you give birth.  Breastfeeding helps to lower your risk of developing type 2 diabetes mellitus, osteoporosis, and breast or ovarian cancer later in life.  Signs that your baby is hungry Early Signs of Hunger  Increased alertness or activity.  Stretching.  Movement of the head from side to side.  Movement of the head and opening of the mouth when the corner of the mouth or cheek is stroked (rooting).  Increased  sucking sounds, smacking lips, cooing, sighing, or squeaking.  Hand-to-mouth movements.  Increased sucking of fingers or hands.  Late Signs of Hunger  Fussing.  Intermittent crying.  Extreme Signs of Hunger Signs of extreme hunger will require calming and consoling before your baby will be able to breastfeed successfully. Do not wait for the following signs of extreme hunger to occur before you initiate breastfeeding:  Restlessness.  A loud, strong cry.  Screaming.  Breastfeeding basics Breastfeeding Initiation  Find a comfortable place to sit or lie down, with your neck and back well supported.  Place a pillow or rolled up blanket under your baby to bring him or her to the level of your breast (if you are seated). Nursing pillows are specially designed to help support your arms and your baby while you breastfeed.  Make sure that your baby's abdomen  is facing your abdomen.  Gently massage your breast. With your fingertips, massage from your chest wall toward your nipple in a circular motion. This encourages milk flow. You may need to continue this action during the feeding if your milk flows slowly.  Support your breast with 4 fingers underneath and your thumb above your nipple. Make sure your fingers are well away from your nipple and your baby's mouth.  Stroke your baby's lips gently with your finger or nipple.  When your baby's mouth is open wide enough, quickly bring your baby to your breast, placing your entire nipple and as much of the colored area around your nipple (areola) as possible into your baby's mouth. ? More areola should be visible above your baby's upper lip than below the lower lip. ? Your baby's tongue should be between his or her lower gum and your breast.  Ensure that your baby's mouth is correctly positioned around your nipple (latched). Your baby's lips should create a seal on your breast and be turned out (everted).  It is common for your baby to suck about 2-3 minutes in order to start the flow of breast milk.  Latching Teaching your baby how to latch on to your breast properly is very important. An improper latch can cause nipple pain and decreased milk supply for you and poor weight gain in your baby. Also, if your baby is not latched onto your nipple properly, he or she may swallow some air during feeding. This can make your baby fussy. Burping your baby when you switch breasts during the feeding can help to get rid of the air. However, teaching your baby to latch on properly is still the best way to prevent fussiness from swallowing air while breastfeeding. Signs that your baby has successfully latched on to your nipple:  Silent tugging or silent sucking, without causing you pain.  Swallowing heard between every 3-4 sucks.  Muscle movement above and in front of his or her ears while sucking.  Signs that  your baby has not successfully latched on to nipple:  Sucking sounds or smacking sounds from your baby while breastfeeding.  Nipple pain.  If you think your baby has not latched on correctly, slip your finger into the corner of your baby's mouth to break the suction and place it between your baby's gums. Attempt breastfeeding initiation again. Signs of Successful Breastfeeding Signs from your baby:  A gradual decrease in the number of sucks or complete cessation of sucking.  Falling asleep.  Relaxation of his or her body.  Retention of a small amount of milk in his or her mouth.  Letting  go of your breast by himself or herself.  Signs from you:  Breasts that have increased in firmness, weight, and size 1-3 hours after feeding.  Breasts that are softer immediately after breastfeeding.  Increased milk volume, as well as a change in milk consistency and color by the fifth day of breastfeeding.  Nipples that are not sore, cracked, or bleeding.  Signs That Your Pecola Leisure is Getting Enough Milk  Wetting at least 1-2 diapers during the first 24 hours after birth.  Wetting at least 5-6 diapers every 24 hours for the first week after birth. The urine should be clear or pale yellow by 5 days after birth.  Wetting 6-8 diapers every 24 hours as your baby continues to grow and develop.  At least 3 stools in a 24-hour period by age 34 days. The stool should be soft and yellow.  At least 3 stools in a 24-hour period by age 31 days. The stool should be seedy and yellow.  No loss of weight greater than 10% of birth weight during the first 44 days of age.  Average weight gain of 4-7 ounces (113-198 g) per week after age 16 days.  Consistent daily weight gain by age 34 days, without weight loss after the age of 2 weeks.  After a feeding, your baby may spit up a small amount. This is common. Breastfeeding frequency and duration Frequent feeding will help you make more milk and can prevent sore  nipples and breast engorgement. Breastfeed when you feel the need to reduce the fullness of your breasts or when your baby shows signs of hunger. This is called "breastfeeding on demand." Avoid introducing a pacifier to your baby while you are working to establish breastfeeding (the first 4-6 weeks after your baby is born). After this time you may choose to use a pacifier. Research has shown that pacifier use during the first year of a baby's life decreases the risk of sudden infant death syndrome (SIDS). Allow your baby to feed on each breast as long as he or she wants. Breastfeed until your baby is finished feeding. When your baby unlatches or falls asleep while feeding from the first breast, offer the second breast. Because newborns are often sleepy in the first few weeks of life, you may need to awaken your baby to get him or her to feed. Breastfeeding times will vary from baby to baby. However, the following rules can serve as a guide to help you ensure that your baby is properly fed:  Newborns (babies 56 weeks of age or younger) may breastfeed every 1-3 hours.  Newborns should not go longer than 3 hours during the day or 5 hours during the night without breastfeeding.  You should breastfeed your baby a minimum of 8 times in a 24-hour period until you begin to introduce solid foods to your baby at around 64 months of age.  Breast milk pumping Pumping and storing breast milk allows you to ensure that your baby is exclusively fed your breast milk, even at times when you are unable to breastfeed. This is especially important if you are going back to work while you are still breastfeeding or when you are not able to be present during feedings. Your lactation consultant can give you guidelines on how long it is safe to store breast milk. A breast pump is a machine that allows you to pump milk from your breast into a sterile bottle. The pumped breast milk can then be stored in a refrigerator  or freezer.  Some breast pumps are operated by hand, while others use electricity. Ask your lactation consultant which type will work best for you. Breast pumps can be purchased, but some hospitals and breastfeeding support groups lease breast pumps on a monthly basis. A lactation consultant can teach you how to hand express breast milk, if you prefer not to use a pump. Caring for your breasts while you breastfeed Nipples can become dry, cracked, and sore while breastfeeding. The following recommendations can help keep your breasts moisturized and healthy:  Avoid using soap on your nipples.  Wear a supportive bra. Although not required, special nursing bras and tank tops are designed to allow access to your breasts for breastfeeding without taking off your entire bra or top. Avoid wearing underwire-style bras or extremely tight bras.  Air dry your nipples for 3-73minutes after each feeding.  Use only cotton bra pads to absorb leaked breast milk. Leaking of breast milk between feedings is normal.  Use lanolin on your nipples after breastfeeding. Lanolin helps to maintain your skin's normal moisture barrier. If you use pure lanolin, you do not need to wash it off before feeding your baby again. Pure lanolin is not toxic to your baby. You may also hand express a few drops of breast milk and gently massage that milk into your nipples and allow the milk to air dry.  In the first few weeks after giving birth, some women experience extremely full breasts (engorgement). Engorgement can make your breasts feel heavy, warm, and tender to the touch. Engorgement peaks within 3-5 days after you give birth. The following recommendations can help ease engorgement:  Completely empty your breasts while breastfeeding or pumping. You may want to start by applying warm, moist heat (in the shower or with warm water-soaked hand towels) just before feeding or pumping. This increases circulation and helps the milk flow. If your baby does  not completely empty your breasts while breastfeeding, pump any extra milk after he or she is finished.  Wear a snug bra (nursing or regular) or tank top for 1-2 days to signal your body to slightly decrease milk production.  Apply ice packs to your breasts, unless this is too uncomfortable for you.  Make sure that your baby is latched on and positioned properly while breastfeeding.  If engorgement persists after 48 hours of following these recommendations, contact your health care provider or a Advertising copywriter. Overall health care recommendations while breastfeeding  Eat healthy foods. Alternate between meals and snacks, eating 3 of each per day. Because what you eat affects your breast milk, some of the foods may make your baby more irritable than usual. Avoid eating these foods if you are sure that they are negatively affecting your baby.  Drink milk, fruit juice, and water to satisfy your thirst (about 10 glasses a day).  Rest often, relax, and continue to take your prenatal vitamins to prevent fatigue, stress, and anemia.  Continue breast self-awareness checks.  Avoid chewing and smoking tobacco. Chemicals from cigarettes that pass into breast milk and exposure to secondhand smoke may harm your baby.  Avoid alcohol and drug use, including marijuana. Some medicines that may be harmful to your baby can pass through breast milk. It is important to ask your health care provider before taking any medicine, including all over-the-counter and prescription medicine as well as vitamin and herbal supplements. It is possible to become pregnant while breastfeeding. If birth control is desired, ask your health care provider about options  that will be safe for your baby. Contact a health care provider if:  You feel like you want to stop breastfeeding or have become frustrated with breastfeeding.  You have painful breasts or nipples.  Your nipples are cracked or bleeding.  Your breasts are  red, tender, or warm.  You have a swollen area on either breast.  You have a fever or chills.  You have nausea or vomiting.  You have drainage other than breast milk from your nipples.  Your breasts do not become full before feedings by the fifth day after you give birth.  You feel sad and depressed.  Your baby is too sleepy to eat well.  Your baby is having trouble sleeping.  Your baby is wetting less than 3 diapers in a 24-hour period.  Your baby has less than 3 stools in a 24-hour period.  Your baby's skin or the white part of his or her eyes becomes yellow.  Your baby is not gaining weight by 73 days of age. Get help right away if:  Your baby is overly tired (lethargic) and does not want to wake up and feed.  Your baby develops an unexplained fever. This information is not intended to replace advice given to you by your health care provider. Make sure you discuss any questions you have with your health care provider. Document Released: 12/31/2004 Document Revised: 06/14/2015 Document Reviewed: 06/24/2012 Elsevier Interactive Patient Education  2017 [employer].

## 2016-10-23 NOTE — Progress Notes (Signed)
Pt informed that the ultrasound is considered a limited OB ultrasound and is not intended to be a complete ultrasound exam.  Patient also informed that the ultrasound is not being completed with the intent of assessing for fetal or placental anomalies or any pelvic abnormalities.  Explained that the purpose of today's ultrasound is to assess for viability.  Patient acknowledges the purpose of the exam and the limitations of the study.    Single IUP FHR 160 bpm per M-mode CRL - 4.60 cm ([redacted]w[redacted]d)

## 2016-10-23 NOTE — Progress Notes (Signed)
  Subjective:    Mary Davies is being seen today for her first obstetrical visit. She is at [redacted]w[redacted]d gestation by [place] today. Her obstetrical history is significant for nulliparity. Relationship with FOB: significant other, living together. Patient does intend to breast feed. Pregnancy history fully reviewed.  Patient reports no complaints.  Review of Systems:   Review of Systems  Constitutional: Negative for chills and fever.  Gastrointestinal: Negative for abdominal pain.  Genitourinary: Negative for dysuria, vaginal bleeding and vaginal discharge.    Objective:     BP (!) 121/58   Pulse (!) 58   Wt 217 lb (98.4 kg)   LMP 07/07/2016 (Approximate)   BMI 39.69 kg/m  Physical Exam  Nursing note and vitals reviewed. Constitutional: She is oriented to person, place, and time. She appears well-developed and well-nourished. No distress.  Eyes: No scleral icterus.  Cardiovascular: Normal rate, regular rhythm and normal heart sounds.   Respiratory: Effort normal and breath sounds normal. No respiratory distress.  GI: Soft. There is no tenderness.  Musculoskeletal: She exhibits no edema.  Neurological: She is alert and oriented to person, place, and time. She has normal reflexes.  Skin: Skin is warm and dry.  Psychiatric: She has a normal mood and affect.      Fetal Exam Fetal Monitor Review: Mode: ultrasound.   Baseline rate: 160.         Assessment:    Pregnancy: G2P0010 Patient Active Problem List   Diagnosis Date Noted  . Supervision of other normal pregnancy, antepartum 10/23/2016  . Left breast abscess 09/24/2012  . Cellulitis 07/14/2012  . Tobacco abuse 07/14/2012     1. Encounter to determine fetal viability of pregnancy, single or unspecified fetus  - US OB Limited; Future  2. Need for immunization against influenza  - Flu Vaccine QUAD 6+ mos IM (Fluarix)  3. Encounter for supervision of low-risk first pregnancy, antepartum  - Obstetric Panel,  Including HIV - Hemoglobinopathy Evaluation - [place] MFM Fetal Nuchal Translucency; Future - Glucose Tolerance, 1 Hour - Culture, OB Urine - Cervicovaginal ancillary only  4. Encounter for antenatal screening for other genetic defect  - [place] MFM Fetal Nuchal Translucency; Future - Cystic Fibrosis Mutation 97  5. Supervision of other normal pregnancy, antepartum  - Cystic Fibrosis Mutation 97  6. Tobacco abuse - Cessation info given.     Plan:     Initial labs drawn. Prenatal vitamins. Problem list reviewed and updated. AFP3 discussed: ordered. Role of ultrasound in pregnancy discussed; fetal survey: requested. Amniocentesis discussed: not indicated. Follow up in 4 weeks.    Dorathy Kinsman 10/23/2016

## 2016-10-23 NOTE — Progress Notes (Signed)
FOB has hx of epilepsy Flu vaccine/prenatal labs today PHQ9 score 13, declines to see [facility], no [place] Medicaid home form completed by patient

## 2016-10-24 LAB — CERVICOVAGINAL ANCILLARY ONLY
CHLAMYDIA, DNA PROBE: POSITIVE — AB
NEISSERIA GONORRHEA: NEGATIVE

## 2016-10-25 LAB — OBSTETRIC PANEL, INCLUDING HIV
Antibody Screen: NEGATIVE
BASOS ABS: 0 10*3/uL (ref 0.0–0.2)
Basos: 0 %
EOS (ABSOLUTE): 0.1 10*3/uL (ref 0.0–0.4)
EOS: 2 %
HEMOGLOBIN: 13.1 g/dL (ref 11.1–15.9)
HEP B S AG: NEGATIVE
HIV Screen 4th Generation wRfx: NONREACTIVE
Hematocrit: 39.3 % (ref 34.0–46.6)
IMMATURE GRANS (ABS): 0 10*3/uL (ref 0.0–0.1)
IMMATURE GRANULOCYTES: 0 %
LYMPHS: 23 %
Lymphocytes Absolute: 1.7 10*3/uL (ref 0.7–3.1)
MCH: 31.6 pg (ref 26.6–33.0)
MCHC: 33.3 g/dL (ref 31.5–35.7)
MCV: 95 fL (ref 79–97)
MONOS ABS: 0.4 10*3/uL (ref 0.1–0.9)
Monocytes: 6 %
NEUTROS PCT: 69 %
Neutrophils Absolute: 4.8 10*3/uL (ref 1.4–7.0)
PLATELETS: 239 10*3/uL (ref 150–379)
RBC: 4.14 x10E6/uL (ref 3.77–5.28)
RDW: 13.1 % (ref 12.3–15.4)
RH TYPE: NEGATIVE
RPR Ser Ql: NONREACTIVE
Rubella Antibodies, IGG: 4.79 index (ref >0.99)
WBC: 7 10*3/uL (ref 3.4–10.8)

## 2016-10-25 LAB — HEMOGLOBINOPATHY EVALUATION
FERRITIN: 82 ng/mL (ref 15–150)
HGB A2 QUANT: 2.6 % (ref 1.8–3.2)
HGB A: 97.4 % (ref 96.4–98.8)
HGB F QUANT: 0 % (ref 0.0–2.0)
HGB SOLUBILITY: NEGATIVE
Hgb C: 0 %
Hgb S: 0 %
Hgb Variant: 0 %

## 2016-10-25 LAB — GLUCOSE TOLERANCE, 1 HOUR: GLUCOSE, 1HR PP: 146 mg/dL (ref 65–199)

## 2016-10-25 LAB — URINE CULTURE, OB REFLEX

## 2016-10-25 LAB — CULTURE, OB URINE

## 2016-10-28 ENCOUNTER — Telehealth: Payer: Self-pay | Admitting: *Deleted

## 2016-10-28 DIAGNOSIS — A749 Chlamydial infection, unspecified: Secondary | ICD-10-CM

## 2016-10-28 MED ORDER — AZITHROMYCIN 250 MG PO TABS: 1000.0000 mg | ORAL_TABLET | Freq: Once | ORAL | 0 refills | Status: AC

## 2016-10-28 NOTE — Telephone Encounter (Signed)
Attempted to call patient d/t positive chlamydia test. There was no answer, left voice mail stating I am calling concerning test results, please return my call at the clinic. Azithromycin sent to [facility], [employer].

## 2016-10-29 ENCOUNTER — Encounter: Payer: Self-pay | Admitting: *Deleted

## 2016-10-29 NOTE — Telephone Encounter (Signed)
Called patient, no answer. Left message stating I am calling with test results, please call us back as soon as possible. Called alternative number but no answer there & unable to leave message

## 2016-10-30 ENCOUNTER — Encounter (HOSPITAL_COMMUNITY): Payer: Self-pay | Admitting: Advanced Practice Midwife

## 2016-10-30 ENCOUNTER — Encounter: Payer: Self-pay | Admitting: Obstetrics and Gynecology

## 2016-10-30 DIAGNOSIS — A749 Chlamydial infection, unspecified: Secondary | ICD-10-CM | POA: Insufficient documentation

## 2016-10-30 DIAGNOSIS — J45909 Unspecified asthma, uncomplicated: Secondary | ICD-10-CM | POA: Insufficient documentation

## 2016-10-30 DIAGNOSIS — O26899 Other specified pregnancy related conditions, unspecified trimester: Secondary | ICD-10-CM | POA: Insufficient documentation

## 2016-10-30 DIAGNOSIS — Z6791 Unspecified blood type, Rh negative: Secondary | ICD-10-CM

## 2016-10-30 LAB — CYSTIC FIBROSIS MUTATION 97: Interpretation: NOT DETECTED

## 2016-10-31 NOTE — Telephone Encounter (Signed)
Attempted to contact pt @ [phone] and was informed that we had the wrong #.  Pt work # [phone] kept ringing and would disconnect.  Note placed in appt notes and letter sent.

## 2016-11-07 ENCOUNTER — Encounter (HOSPITAL_COMMUNITY): Payer: Self-pay

## 2016-11-07 ENCOUNTER — Ambulatory Visit (HOSPITAL_COMMUNITY)
Admission: RE | Admit: 2016-11-07 | Discharge: 2016-11-07 | Disposition: A | Discharge status: Home / Self Care | Payer: Medicaid Other | Source: Ambulatory Visit | Attending: Advanced Practice Midwife | Admitting: Advanced Practice Midwife

## 2016-11-07 ENCOUNTER — Ambulatory Visit (HOSPITAL_COMMUNITY): Admission: RE | Admit: 2016-11-07 | Payer: Medicaid Other | Source: Ambulatory Visit

## 2016-11-07 ENCOUNTER — Other Ambulatory Visit (HOSPITAL_COMMUNITY): Payer: Medicaid Other

## 2016-11-07 DIAGNOSIS — Z3400 Encounter for supervision of normal first pregnancy, unspecified trimester: Secondary | ICD-10-CM

## 2016-11-07 DIAGNOSIS — Z368A Encounter for antenatal screening for other genetic defects: Secondary | ICD-10-CM

## 2016-11-20 ENCOUNTER — Ambulatory Visit (INDEPENDENT_AMBULATORY_CARE_PROVIDER_SITE_OTHER): Payer: Medicaid Other | Admitting: Family Medicine

## 2016-11-20 VITALS — BP 120/65 | HR 67 | Wt 218.9 lb

## 2016-11-20 DIAGNOSIS — A749 Chlamydial infection, unspecified: Secondary | ICD-10-CM

## 2016-11-20 DIAGNOSIS — Z720 Tobacco use: Secondary | ICD-10-CM

## 2016-11-20 DIAGNOSIS — Z3480 Encounter for supervision of other normal pregnancy, unspecified trimester: Secondary | ICD-10-CM

## 2016-11-20 MED ORDER — AZITHROMYCIN 250 MG PO TABS: 1000.0000 mg | ORAL_TABLET | Freq: Once | ORAL | 0 refills | Status: AC

## 2016-11-20 NOTE — Patient Instructions (Signed)
Chlamydia, Female Chlamydia is an STD (sexually transmitted disease). This is an infection that spreads through sexual contact. If it is not treated, it can cause serious problems. It must be treated with antibiotic medicine. Sometimes, you may not have symptoms (asymptomatic). When you have symptoms, they can include:  Burning when you pee (urinate).  Peeing often.  Fluid (discharge) coming from the vagina.  Redness, soreness, and swelling (inflammation) of the butt (rectum).  Bleeding or fluid coming from the butt.  Belly (abdominal) pain.  Pain during sex.  Bleeding between periods.  Itching, burning, or redness in the eyes.  Fluid coming from the eyes.  Follow these instructions at home: Medicines  Take over-the-counter and prescription medicines only as told by your doctor.  Take your antibiotic medicine as told by your doctor. Do not stop taking the antibiotic even if you start to feel better. Sexual activity  Tell sex partners about your infection. Sex partners are people you had oral, anal, or vaginal sex with within 60 days of when you started getting sick. They need treatment, too.  Do not have sex until: ? You and your sex partners have been treated. ? Your doctor says it is okay.  If you have a single dose treatment, wait 7 days before having sex. General instructions  It is up to you to get your test results. Ask your doctor when your results will be ready.  Get a lot of rest.  Eat healthy foods.  Drink enough fluid to keep your pee (urine) clear or pale yellow.  Keep all follow-up visits as told by your doctor. You may need tests after 3 months. Preventing chlamydia  The only way to prevent chlamydia is not to have sex. To lower your risk: ? Use latex condoms correctly. Do this every time you have sex. ? Avoid having many sex partners. ? Ask if your partner has been tested for STDs and if he or she had negative results. Contact a doctor if:  You  get new symptoms.  You do not get better with treatment.  You have a fever or chills.  You have pain during sex. Get help right away if:  Your pain gets worse and does not get better with medicine.  You get flu-like symptoms, such as: ? Night sweats. ? Sore throat. ? Muscle aches.  You feel sick to your stomach (nauseous).  You throw up (vomit).  You have trouble swallowing.  You have bleeding: ? Between periods. ? After sex.  You have irregular periods.  You have belly pain that does not get better with medicine.  You have lower back pain that does not get better with medicine.  You feel weak or dizzy.  You pass out (faint).  You are pregnant and you get symptoms of chlamydia. Summary  Chlamydia is an infection that spreads through sexual contact.  Sometimes, chlamydia can cause no symptoms (asymptomatic).  Do not have sex until your doctor says it is okay.  All sex partners will have to be treated for chlamydia. This information is not intended to replace advice given to you by your health care provider. Make sure you discuss any questions you have with your health care provider. Document Released: 10/10/2007 Document Revised: 12/21/2015 Document Reviewed: 12/21/2015 Elsevier Interactive Patient Education  2017 [employer] Inc[employer]  

## 2016-11-20 NOTE — Progress Notes (Signed)
   PRENATAL VISIT NOTE  Subjective:  Mary Davies is a 26 y.o. G2P0010 at 10473w2d being seen today for ongoing prenatal care.  She is currently monitored for the following issues for this low-risk pregnancy and has Tobacco abuse; Supervision of other normal pregnancy, antepartum; Asthma; Chlamydia infection; Rh negative status during pregnancy; and Abnormal O'Sullivan glucose challenge test, antepartum on their problem list.  Patient reports no complaints.  Contractions: Not present. Vag. Bleeding: None.  Movement: Absent. Denies leaking of fluid.   The following portions of the patient's history were reviewed and updated as appropriate: allergies, current medications, past family history, past medical history, past social history, past surgical history and problem list. Problem list updated.  Objective:   Vitals:   11/20/16 0852  BP: 120/65  Pulse: 67  Weight: 218 lb 14.4 oz (99.3 kg)    Fetal Status: Fetal Heart Rate (bpm): 142   Movement: Absent     General:  Alert, oriented and cooperative. Patient is in no acute distress.  Skin: Skin is warm and dry. No rash noted.   Cardiovascular: Normal heart rate noted  Respiratory: Normal respiratory effort, no problems with respiration noted  Abdomen: Soft, gravid, appropriate for gestational age.  Pain/Pressure: Absent     Pelvic: Cervical exam deferred        Extremities: Normal range of motion.  Edema: None  Mental Status:  Normal mood and affect. Normal behavior. Normal judgment and thought content.   Assessment and Plan:  Pregnancy: G2P0010 at 7373w2d  1. Pregnancy at [redacted] weeks gestation-routine care. Initial labs already drawn.  2. Chlamydia infection- given prescription today and gave prescription for partner  3. Tobacco abuse in pregnancy- now down to 2 cigarettes per day. Declines nicotine patch.  Preterm labor symptoms and general obstetric precautions including but not limited to vaginal bleeding, contractions, leaking of  fluid and fetal movement were reviewed in detail with the patient. Please refer to After Visit Summary for other counseling recommendations.  Return in about 4 weeks (around 12/18/2016).   Rolm BookbinderAmber Liv Rallis, DO

## 2016-12-18 ENCOUNTER — Ambulatory Visit (INDEPENDENT_AMBULATORY_CARE_PROVIDER_SITE_OTHER): Payer: Medicaid Other | Admitting: Student

## 2016-12-18 VITALS — BP 109/57 | HR 79 | Wt 219.1 lb

## 2016-12-18 DIAGNOSIS — A749 Chlamydial infection, unspecified: Secondary | ICD-10-CM

## 2016-12-18 DIAGNOSIS — Z3480 Encounter for supervision of other normal pregnancy, unspecified trimester: Secondary | ICD-10-CM

## 2016-12-18 DIAGNOSIS — O99810 Abnormal glucose complicating pregnancy: Secondary | ICD-10-CM

## 2016-12-18 DIAGNOSIS — Z3482 Encounter for supervision of other normal pregnancy, second trimester: Secondary | ICD-10-CM

## 2016-12-18 DIAGNOSIS — Z3689 Encounter for other specified antenatal screening: Secondary | ICD-10-CM

## 2016-12-18 NOTE — Patient Instructions (Signed)

## 2016-12-19 NOTE — Progress Notes (Signed)
   PRENATAL VISIT NOTE  Subjective:  Meta HatchetJacqueline Dymek is a 26 y.o. G2P0010 at 7669w3d being seen today for ongoing prenatal care.  She is currently monitored for the following issues for this low-risk pregnancy and has Tobacco abuse; Supervision of other normal pregnancy, antepartum; Asthma; Chlamydia infection; Rh negative status during pregnancy; and Abnormal O'Sullivan glucose challenge test, antepartum on their problem list.  Patient reports no complaints.  Contractions: Not present. Vag. Bleeding: None.  Movement: Present. Denies leaking of fluid.   The following portions of the patient's history were reviewed and updated as appropriate: allergies, current medications, past family history, past medical history, past social history, past surgical history and problem list. Problem list updated.  Objective:   Vitals:   12/18/16 0904  BP: (!) 109/57  Pulse: 79  Weight: 219 lb 1.6 oz (99.4 kg)    Fetal Status: Fetal Heart Rate (bpm): 145   Movement: Present     General:  Alert, oriented and cooperative. Patient is in no acute distress.  Skin: Skin is warm and dry. No rash noted.   Cardiovascular: Normal heart rate noted  Respiratory: Normal respiratory effort, no problems with respiration noted  Abdomen: Soft, gravid, appropriate for gestational age.  Pain/Pressure: Absent     Pelvic: Cervical exam deferred        Extremities: Normal range of motion.  Edema: None  Mental Status:  Normal mood and affect. Normal behavior. Normal judgment and thought content.   Assessment and Plan:  Pregnancy: G2P0010 at 5969w3d  1. Screening, antenatal, for fetal anatomic survey  - US MFM OB COMP + 14 WK; Future  2. Chlamydia infection Partner has not been tested or treated; reviewed importance of abstinence or condoms until he can get treated.   3. Supervision of other normal pregnancy, antepartum   4. Abnormal O'Sullivan glucose challenge test, antepartum -Patient will do 3 hour on 12-12 when  she returns for anatomy scan. She will also do Quad screen on 12-12  Preterm labor symptoms and general obstetric precautions including but not limited to vaginal bleeding, contractions, leaking of fluid and fetal movement were reviewed in detail with the patient. Please refer to After Visit Summary for other counseling recommendations.  Return in about 7 days (around 12/25/2016).   Marylene LandKathryn Lorraine Kooistra, CNM

## 2016-12-25 ENCOUNTER — Ambulatory Visit (HOSPITAL_COMMUNITY)
Admission: RE | Admit: 2016-12-25 | Discharge: 2016-12-25 | Disposition: A | Discharge status: Home / Self Care | Payer: Medicaid Other | Source: Ambulatory Visit | Attending: Student | Admitting: Student

## 2016-12-25 ENCOUNTER — Other Ambulatory Visit: Payer: Medicaid Other

## 2016-12-25 ENCOUNTER — Other Ambulatory Visit: Payer: Self-pay | Admitting: Student

## 2016-12-25 DIAGNOSIS — Z3A20 20 weeks gestation of pregnancy: Secondary | ICD-10-CM | POA: Diagnosis not present

## 2016-12-25 DIAGNOSIS — Z363 Encounter for antenatal screening for malformations: Secondary | ICD-10-CM | POA: Diagnosis not present

## 2016-12-25 DIAGNOSIS — R7309 Other abnormal glucose: Secondary | ICD-10-CM

## 2016-12-25 DIAGNOSIS — Z3689 Encounter for other specified antenatal screening: Secondary | ICD-10-CM

## 2016-12-25 DIAGNOSIS — O99212 Obesity complicating pregnancy, second trimester: Secondary | ICD-10-CM | POA: Diagnosis not present

## 2016-12-25 DIAGNOSIS — O99210 Obesity complicating pregnancy, unspecified trimester: Secondary | ICD-10-CM

## 2016-12-27 LAB — GESTATIONAL GLUCOSE TOLERANCE
GLUCOSE 1 HOUR GTT: 146 mg/dL (ref 65–179)
GLUCOSE 2 HOUR GTT: 158 mg/dL — AB (ref 65–154)
Glucose, Fasting: 87 mg/dL (ref 65–94)

## 2016-12-30 ENCOUNTER — Encounter: Payer: Self-pay | Admitting: Obstetrics and Gynecology

## 2016-12-30 DIAGNOSIS — O24419 Gestational diabetes mellitus in pregnancy, unspecified control: Secondary | ICD-10-CM | POA: Insufficient documentation

## 2016-12-30 MED ORDER — ACCU-CHEK NANO SMARTVIEW W/DEVICE KIT: 1.0000 | PACK | 0 refills | Status: DC

## 2016-12-30 MED ORDER — ASPIRIN EC 81 MG PO TBEC: 81.0000 mg | DELAYED_RELEASE_TABLET | Freq: Every day | ORAL | 2 refills | Status: DC

## 2016-12-30 MED ORDER — GLUCOSE BLOOD VI STRP: ORAL_STRIP | 12 refills | Status: DC

## 2016-12-30 MED ORDER — ACCU-CHEK FASTCLIX LANCETS MISC: 1.0000 [IU] | Freq: Four times a day (QID) | 12 refills | Status: DC

## 2017-01-02 ENCOUNTER — Telehealth: Payer: Self-pay | Admitting: General Practice

## 2017-01-02 NOTE — Telephone Encounter (Signed)
-----   Message from Catalina AntiguaPeggy Constant, MD sent at 12/30/2016  8:15 AM EST ----- Please inform patient of failed 2 hour glucola and diagnosis of GDM. Referral to nutrition placed. Testing supplies have been e-prescribed. Instruct patient to start baby ASA. Please schedule fetal echo

## 2017-01-02 NOTE — Telephone Encounter (Addendum)
Called patient, no answer- unable to leave message as voicemail has not been set up yet. Called emergency contact number several times but phone line is always busy. Will send letter

## 2017-01-10 ENCOUNTER — Encounter: Payer: Medicaid Other | Admitting: Obstetrics & Gynecology

## 2017-01-16 ENCOUNTER — Encounter (HOSPITAL_COMMUNITY): Payer: Self-pay | Admitting: *Deleted

## 2017-01-16 ENCOUNTER — Other Ambulatory Visit: Payer: Self-pay

## 2017-01-16 ENCOUNTER — Inpatient Hospital Stay (HOSPITAL_COMMUNITY)
Admission: AD | Admit: 2017-01-16 | Discharge: 2017-01-16 | Disposition: A | Discharge status: Home / Self Care | Payer: Medicaid Other | Source: Ambulatory Visit | Attending: Obstetrics & Gynecology | Admitting: Obstetrics & Gynecology

## 2017-01-16 DIAGNOSIS — Z3A23 23 weeks gestation of pregnancy: Secondary | ICD-10-CM | POA: Diagnosis not present

## 2017-01-16 DIAGNOSIS — O99332 Smoking (tobacco) complicating pregnancy, second trimester: Secondary | ICD-10-CM | POA: Diagnosis not present

## 2017-01-16 DIAGNOSIS — O368120 Decreased fetal movements, second trimester, not applicable or unspecified: Secondary | ICD-10-CM | POA: Diagnosis not present

## 2017-01-16 DIAGNOSIS — O24419 Gestational diabetes mellitus in pregnancy, unspecified control: Secondary | ICD-10-CM

## 2017-01-16 HISTORY — DX: Major depressive disorder, single episode, unspecified: F32.9

## 2017-01-16 HISTORY — DX: Depression, unspecified: F32.A

## 2017-01-16 MED ORDER — PRENATAL VITAMINS 0.8 MG PO TABS: 1.0000 | ORAL_TABLET | Freq: Every day | ORAL | 12 refills | Status: DC

## 2017-01-16 NOTE — Discharge Instructions (Signed)
Eating Plan for Pregnant Women While you are pregnant, your body will require additional nutrition to help support your growing baby. It is recommended that you consume:  150 additional calories each day during your first trimester.  300 additional calories each day during your second trimester.  300 additional calories each day during your third trimester.  Eating a healthy, well-balanced diet is very important for your health and for your baby's health. You also have a higher need for some vitamins and minerals, such as folic acid, calcium, iron, and vitamin D. What do I need to know about eating during pregnancy?  Do not try to lose weight or go on a diet during pregnancy.  Choose healthy, nutritious foods. Choose  of a sandwich with a glass of milk instead of a candy bar or a high-calorie sugar-sweetened beverage.  Limit your overall intake of foods that have "empty calories." These are foods that have little nutritional value, such as sweets, desserts, candies, sugar-sweetened beverages, and fried foods.  Eat a variety of foods, especially fruits and vegetables.  Take a prenatal vitamin to help meet the additional needs during pregnancy, specifically for folic acid, iron, calcium, and vitamin D.  Remember to stay active. Ask your health care provider for exercise recommendations that are specific to you.  Practice good food safety and cleanliness, such as washing your hands before you eat and after you prepare raw meat. This helps to prevent foodborne illnesses, such as listeriosis, that can be very dangerous for your baby. Ask your health care provider for more information about listeriosis. What does 150 extra calories look like? Healthy options for an additional 150 calories each day could be any of the following:  Plain low-fat yogurt (6-8 oz) with  cup of berries.  1 apple with 2 teaspoons of peanut butter.  Cut-up vegetables with  cup of hummus.  Low-fat chocolate milk  (8 oz or 1 cup).  1 string cheese with 1 medium orange.   of a peanut butter and jelly sandwich on whole-wheat bread (1 tsp of peanut butter).  For 300 calories, you could eat two of those healthy options each day. What is a healthy amount of weight to gain? The recommended amount of weight for you to gain is based on your pre-pregnancy BMI. If your pre-pregnancy BMI was:  Less than 18 (underweight), you should gain 28-40 lb.  18-24.9 (normal), you should gain 25-35 lb.  25-29.9 (overweight), you should gain 15-25 lb.  Greater than 30 (obese), you should gain 11-20 lb.  What if I am having twins or multiples? Generally, pregnant women who will be having twins or multiples may need to increase their daily calories by 300-600 calories each day. The recommended range for total weight gain is 25-54 lb, depending on your pre-pregnancy BMI. Talk with your health care provider for specific guidance about additional nutritional needs, weight gain, and exercise during your pregnancy. What foods can I eat? Grains Any grains. Try to choose whole grains, such as whole-wheat bread, oatmeal, or brown rice. Vegetables Any vegetables. Try to eat a variety of colors and types of vegetables to get a full range of vitamins and minerals. Remember to wash your vegetables well before eating. Fruits Any fruits. Try to eat a variety of colors and types of fruit to get a full range of vitamins and minerals. Remember to wash your fruits well before eating. Meats and Other Protein Sources Lean meats, including chicken, [place], fish, and lean cuts of beef, veal,  or pork. Make sure that all meats are cooked to "well done." Tofu. Tempeh. Beans. Eggs. Peanut butter and other nut butters. Seafood, such as shrimp, crab, and lobster. If you choose fish, select types that are higher in omega-3 fatty acids, including salmon, herring, mussels, trout, sardines, and pollock. Make sure that all meats are cooked to food-safe  temperatures. Dairy Pasteurized milk and milk alternatives. Pasteurized yogurt and pasteurized cheese. Cottage cheese. Sour cream. Beverages Water. Juices that contain 100% fruit juice or vegetable juice. Caffeine-free teas and decaffeinated coffee. Drinks that contain caffeine are okay to drink, but it is better to avoid caffeine. Keep your total caffeine intake to less than 200 mg each day (12 oz of coffee, tea, or soda) or as directed by your health care provider. Condiments Any pasteurized condiments. Sweets and Desserts Any sweets and desserts. Fats and Oils Any fats and oils. The items listed above may not be a complete list of recommended foods or beverages. Contact your dietitian for more options. What foods are not recommended? Vegetables Unpasteurized (raw) vegetable juices. Fruits Unpasteurized (raw) fruit juices. Meats and Other Protein Sources Cured meats that have nitrates, such as bacon, salami, and hotdogs. Luncheon meats, bologna, or other deli meats (unless they are reheated until they are steaming hot). Refrigerated pate, meat spreads from a meat counter, smoked seafood that is found in the refrigerated section of a store. Raw fish, such as sushi or sashimi. High mercury content fish, such as tilefish, shark, swordfish, and king mackerel. Raw meats, such as tuna or beef tartare. Undercooked meats and poultry. Make sure that all meats are cooked to food-safe temperatures. Dairy Unpasteurized (raw) milk and any foods that have raw milk in them. Soft cheeses, such as feta, queso blanco, queso fresco, Brie, Camembert cheeses, blue-veined cheeses, and Panela cheese (unless it is made with pasteurized milk, which must be stated on the label). Beverages Alcohol. Sugar-sweetened beverages, such as sodas, teas, or energy drinks. Condiments Homemade fermented foods and drinks, such as pickles, sauerkraut, or kombucha drinks. (Store-bought pasteurized versions of these are  okay.) Other Salads that are made in the store, such as ham salad, chicken salad, egg salad, tuna salad, and seafood salad. The items listed above may not be a complete list of foods and beverages to avoid. Contact your dietitian for more information. This information is not intended to replace advice given to you by your health care provider. Make sure you discuss any questions you have with your health care provider. Document Released: 10/15/2013 Document Revised: 06/08/2015 Document Reviewed: 06/15/2013 Elsevier Interactive Patient Education  2018 [employer]. Gestational Diabetes Mellitus, Self Care When you have gestational diabetes (gestational diabetes mellitus), you must keep your blood sugar (glucose) under control. You can do this with:  Nutrition.  Exercise.  Lifestyle changes.  Medicines or insulin, if needed.  Support from your doctors and others.  How do I manage my blood sugar?  Check your blood sugar every day during pregnancy. Check it as often as told.  Call your doctor if your blood sugar is above your goal numbers for 2 tests in a row. Your doctor will set treatment goals for you. Try to have these blood sugars:  After not eating for a long time (after fasting): at or below 95 mg/dL (5.3 mmol/L).  After meals (postprandial): ? One hour after a meal: at or below 140 mg/dL (7.8 mmol/L). ? Two hours after a meal: at or below 120 mg/dL (6.7 mmol/L).  A1c (hemoglobin A1c)  level: 6-6.5%.  What do I need to know about high blood sugar? High blood sugar is called hyperglycemia. Know the early signs of high blood sugar. Signs include:  Feeling: ? Thirsty. ? Hungry. ? Very tired.  Needing to pee (urinate) more than usual.  Blurry vision.  What do I need to know about low blood sugar? Low blood sugar is called hypoglycemia. This is when blood sugar is at or below 70 mg/dL (3.9 mmol/L). Symptoms may include:  Feeling: ? Hungry. ? Worried or nervous  (anxious). ? Sweaty or clammy. ? Confused. ? Dizzy. ? Sleepy. ? Sick to your stomach (nauseous).  Having: ? A fast heartbeat. ? A headache. ? A change in vision. ? Jerky movements that you cannot control (seizure). ? Nightmares. ? Tingling or no feeling (numbness) around the mouth, lips, or tongue.  Having trouble with: ? Talking. ? Paying attention (concentrating). ? Moving (coordination). ? Sleeping.  Shaking.  Passing out (fainting).  Getting upset easily (irritability).  Treating low blood sugar  To treat low blood sugar, eat or drink something sugary right away. If you can think clearly and swallow safely, follow the 15:15 rule:  Take 15 grams of a fast-acting carb (carbohydrate). Some fast-acting carbs are: ? 1 tube of glucose gel. ? 3 sugar tablets (glucose pills). ? 6-8 pieces of hard candy. ? 4 oz (120 mL) of fruit juice. ? 4 oz (120 mL) regular (not diet) soda.  Check your blood sugar 15 minutes after you take the carb.  If your blood sugar is still at or below 70 mg/dL (3.9 mmol/L), take 15 grams of a carb again.  If your blood sugar does not go above 70 mg/dL (3.9 mmol/L) after 3 tries, get help right away.  After your blood sugar goes back to normal, eat a meal or a snack within 1 hour.  Treating very low blood sugar If your blood sugar is at or below 54 mg/dL (3 mmol/L), you have very low blood sugar (severe hypoglycemia). This is an emergency. Do not wait to see if the symptoms will go away. Get medical help right away. Call your local emergency services (911 in the [place] Do not drive yourself to the hospital. If you have very low blood sugar and you cannot eat or drink, you may need a glucagon shot (injection). A family member or friend should learn:  How to check your blood sugar.  How to give you a glucagon shot.  Ask your doctor if you need a glucagon shot kit at home. What else is important to manage my diabetes? Medicine  Take your  insulin and diabetes medicines as told.  Do not run out of insulin or medicines.  Adjust your insulin and diabetes medicines as told. Food   Make healthy food choices. These include: ? Chicken, fish, egg whites, and beans. ? Oats, whole wheat, bulgur, brown rice, quinoa, and millet. ? Fresh fruits and vegetables. ? Low-fat dairy products. ? Nuts, avocado, olive oil, and canola oil.  Make an eating plan. A food specialist (dietitian) can help you.  Follow instructions from your doctor about what you cannot eat or drink.  Drink enough fluid to keep your pee (urine) clear or pale yellow.  Eat healthy snacks between healthy meals.  Keep track of carbs you eat. Read food labels. Learn about food serving sizes.  Follow your sick day plan when you cannot eat or drink normally. Make this plan with your doctor. Activity  Exercise 30 minutes  or more a day or as much as told by your doctor.  Talk with your doctor if you start a new exercise. Your doctor may need to adjust your insulin, medicines, or food. Lifestyle  Do not drink alcohol.  Do not use any tobacco products. If you need help quitting, ask your doctor.  Learn how to deal with stress. If you need help with this, ask your doctor. Body care   Stay up to date with your shots (immunizations).  Brush your teeth and gums two times a day. Floss at least one time a day.  Go to the dentist least one time every 6 months.  Stay at a healthy weight while you are pregnant. General instructions  Take over-the-counter and prescription medicines only as told by your doctor.  Ask your doctor about risks of high blood pressure in pregnancy. These are called preeclampsia and eclampsia.  Share your diabetes care plan with: ? Your work or school. ? People you live with.  Check your pee for ketones: ? When you are sick. ? As told by your doctor.  Ask your doctor: ? Do I need to meet with a diabetes educator? ? Where can I  find a support group for people with diabetes?  Carry a card or wear jewelry that says that you have diabetes.  Keep all follow-up visits with your doctor. This is important. Care after giving birth   Have your blood sugar checked 4-12 weeks after you give birth.  Get checked for diabetes at least every 3 years. Where to find more information: To learn more about diabetes, visit:  American Diabetes Association: www.diabetes.org/diabetes-basics/gestational  Centers for Disease Control and Prevention (CDC): http://sanchez-watson.com/.pdf  This information is not intended to replace advice given to you by your health care provider. Make sure you discuss any questions you have with your health care provider. Document Released: 04/24/2015 Document Revised: 06/08/2015 Document Reviewed: 02/03/2015 Elsevier Interactive Patient Education  2018 [place] of Pregnancy The second trimester is from week 13 through week 28, month 4 through 6. This is often the time in pregnancy that you feel your best. Often times, morning sickness has lessened or quit. You may have more energy, and you may get hungry more often. Your unborn baby (fetus) is growing rapidly. At the end of the sixth month, he or she is about 9 inches long and weighs about 1 pounds. You will likely feel the baby move (quickening) between 18 and 20 weeks of pregnancy. Follow these instructions at home:  Avoid all smoking, herbs, and alcohol. Avoid drugs not approved by your doctor.  Do not use any tobacco products, including cigarettes, chewing tobacco, and electronic cigarettes. If you need help quitting, ask your doctor. You may get counseling or other support to help you quit.  Only take medicine as told by your doctor. Some medicines are safe and some are not during pregnancy.  Exercise only as told by your doctor. Stop exercising if you start having cramps.  Eat regular, healthy  meals.  Wear a good support bra if your breasts are tender.  Do not use hot tubs, steam rooms, or saunas.  Wear your seat belt when driving.  Avoid raw meat, uncooked cheese, and liter boxes and soil used by cats.  Take your prenatal vitamins.  Take 1500-2000 milligrams of calcium daily starting at the 20th week of pregnancy until you deliver your baby.  Try taking medicine that helps you poop (stool softener) as needed, and  if your doctor approves. Eat more fiber by eating fresh fruit, vegetables, and whole grains. Drink enough fluids to keep your pee (urine) clear or pale yellow.  Take warm water baths (sitz baths) to soothe pain or discomfort caused by hemorrhoids. Use hemorrhoid cream if your doctor approves.  If you have puffy, bulging veins (varicose veins), wear support hose. Raise (elevate) your feet for 15 minutes, 3-4 times a day. Limit salt in your diet.  Avoid heavy lifting, wear low heals, and sit up straight.  Rest with your legs raised if you have leg cramps or low back pain.  Visit your dentist if you have not gone during your pregnancy. Use a soft toothbrush to brush your teeth. Be gentle when you floss.  You can have sex (intercourse) unless your doctor tells you not to.  Go to your doctor visits. Get help if:  You feel dizzy.  You have mild cramps or pressure in your lower belly (abdomen).  You have a nagging pain in your belly area.  You continue to feel sick to your stomach (nauseous), throw up (vomit), or have watery poop (diarrhea).  You have bad smelling fluid coming from your vagina.  You have pain with peeing (urination). Get help right away if:  You have a fever.  You are leaking fluid from your vagina.  You have spotting or bleeding from your vagina.  You have severe belly cramping or pain.  You lose or gain weight rapidly.  You have trouble catching your breath and have chest pain.  You notice sudden or extreme puffiness (swelling)  of your face, hands, ankles, feet, or legs.  You have not felt the baby move in over an hour.  You have severe headaches that do not go away with medicine.  You have vision changes. This information is not intended to replace advice given to you by your health care provider. Make sure you discuss any questions you have with your health care provider. Document Released: 03/27/2009 Document Revised: 06/08/2015 Document Reviewed: 03/03/2012 Elsevier Interactive Patient Education  2017 [employer].

## 2017-01-16 NOTE — MAU Provider Note (Signed)
History     CSN: [id number]  Arrival date and time: 01/16/17 1126   First Provider Initiated Contact with Patient 01/16/17 1246      Chief Complaint  Patient presents with  . Decreased Fetal Movement   G2P0010 @23 .3 wks here with no FM. Hasn't felt FM in 2 days. Eating and drinking but not well, poor appetite. Denies VB, LOF, or ctx/pain.    OB History    Gravida Para Term Preterm AB Living   2       1     SAB TAB Ectopic Multiple Live Births   1              Past Medical History:  Diagnosis Date  . Asthma    prn inhaler  . Depression    hx of, fine now  . Fracture of metatarsal bone of left foot 04/22/2015   5th metatarsal  . Left breast abscess 04/2015    Past Surgical History:  Procedure Laterality Date  . INCISION AND DRAINAGE ABSCESS Left 09/24/2012   Procedure: INCISION AND DRAINAGE BREAST ABSCESS;  Surgeon: Merrie Roof, MD;  Location: [place];  Service: General;  Laterality: Left;  . INCISION AND DRAINAGE ABSCESS Left 05/08/2015   Procedure: INCISION AND DRAINAGE LEFT BREAST  ABSCESS;  Surgeon: Autumn Messing III, MD;  Location: [place];  Service: General;  Laterality: Left;    Family History  Problem Relation Age of Onset  . Diabetes Mother   . Asthma Father     Social History   Tobacco Use  . Smoking status: Current Every Day Smoker    Packs/day: 0.25    Years: 10.00    Pack years: 2.50    Types: Cigarettes  . Smokeless tobacco: Never Used  . Tobacco comment: trying to quit  Substance Use Topics  . Alcohol use: No  . Drug use: No    Allergies:  Allergies  Allergen Reactions  . Pineapple Shortness Of Breath, Itching and Swelling    Medications Prior to Admission  Medication Sig Dispense Refill Last Dose  . Prenatal MV-Min-FA-Omega-3 (PRENATAL GUMMIES/DHA & FA PO) Take by mouth.   01/16/2017 at Unknown time  . ACCU-CHEK FASTCLIX LANCETS MISC 1 Units by Percutaneous route 4 (four) times daily. 100 each 12   . albuterol (PROVENTIL  HFA;VENTOLIN HFA) 108 (90 Base) MCG/ACT inhaler Inhale into the lungs every 6 (six) hours as needed for wheezing or shortness of breath.   Not Taking  . aspirin EC 81 MG tablet Take 1 tablet (81 mg total) by mouth daily. Take after 12 weeks for prevention of preeclampsia later in pregnancy (Patient not taking: Reported on 01/16/2017) 300 tablet 2 Not Taking at Unknown time  . Blood Glucose Monitoring Suppl (ACCU-CHEK NANO SMARTVIEW) w/Device KIT 1 kit by Subdermal route as directed. Check blood sugars for fasting, and two hours after breakfast, lunch and dinner (4 checks daily) 1 kit 0   . glucose blood (ACCU-CHEK SMARTVIEW) test strip Use as instructed to check blood sugars 100 each 12   . guaiFENesin 200 MG tablet Take 1 tablet (200 mg total) by mouth every 4 (four) hours as needed for cough or to loosen phlegm. (Patient not taking: Reported on 10/23/2016) 30 suppository 0 Not Taking  . meloxicam (MOBIC) 7.5 MG tablet Take 1 tablet (7.5 mg total) by mouth daily. (Patient not taking: Reported on 10/23/2016) 20 tablet 0 Not Taking  . naproxen (NAPROSYN) 500 MG tablet Take 1 tablet (  500 mg total) by mouth 2 (two) times daily. (Patient not taking: Reported on 10/23/2016) 20 tablet 0 Not Taking  . predniSONE (DELTASONE) 20 MG tablet Take 2 tablets (40 mg total) by mouth daily with breakfast. For the next four days (Patient not taking: Reported on 10/23/2016) 8 tablet 0 Not Taking  . sulfamethoxazole-trimethoprim (BACTRIM DS,SEPTRA DS) 800-160 MG tablet Take 1 tablet by mouth 2 (two) times daily. (Patient not taking: Reported on 10/23/2016) 14 tablet 0 Not Taking    Review of Systems  Gastrointestinal: Positive for nausea. Negative for abdominal pain.  Genitourinary: Negative for vaginal bleeding.   Physical Exam   Blood pressure 108/63, pulse 86, temperature 98.5 F (36.9 C), temperature source Oral, resp. rate 16, last menstrual period 07/07/2016.  Physical Exam  Constitutional: She is oriented to  person, place, and time. She appears well-developed and well-nourished. No distress.  HENT:  Head: Normocephalic and atraumatic.  Neck: Normal range of motion.  Cardiovascular: Normal rate.  Respiratory: Effort normal. No respiratory distress.  GI: Soft. She exhibits no distension. There is no tenderness.  gravid  Musculoskeletal: Normal range of motion.  Neurological: She is alert and oriented to person, place, and time.  Skin: Skin is warm and dry.  Psychiatric: She has a normal mood and affect.  EFM: 145 bpm, mod variability, + accels, no decels Toco: none  No results found for this or any previous visit (from the past 24 hour(s)).  MAU Course  Procedures  MDM Pt feeling good FM since EFM applied. FM audible on monitor. Pt reassured. Discussed consistency of FM at this gestational age, may be variable. Pt requests Rx for PNV and literature on pregnancy. Stable for discharge home.  Assessment and Plan  [redacted] weeks gestation Decreased fetal movements  Discharge home Follow up in OB office next week as scheduled Rx PNV  Allergies as of 01/16/2017      Reactions   Pineapple Shortness Of Breath, Itching, Swelling      Medication List    STOP taking these medications   aspirin EC 81 MG tablet   guaiFENesin 200 MG tablet   meloxicam 7.5 MG tablet Commonly known as:  MOBIC   naproxen 500 MG tablet Commonly known as:  NAPROSYN   predniSONE 20 MG tablet Commonly known as:  DELTASONE   PRENATAL GUMMIES/DHA & FA PO   sulfamethoxazole-trimethoprim 800-160 MG tablet Commonly known as:  BACTRIM DS,SEPTRA DS     TAKE these medications   ACCU-CHEK FASTCLIX LANCETS Misc 1 Units by Percutaneous route 4 (four) times daily.   ACCU-CHEK NANO SMARTVIEW w/Device Kit 1 kit by Subdermal route as directed. Check blood sugars for fasting, and two hours after breakfast, lunch and dinner (4 checks daily)   albuterol 108 (90 Base) MCG/ACT inhaler Commonly known as:  PROVENTIL  HFA;VENTOLIN HFA Inhale into the lungs every 6 (six) hours as needed for wheezing or shortness of breath.   glucose blood test strip Commonly known as:  ACCU-CHEK SMARTVIEW Use as instructed to check blood sugars   Prenatal Vitamins 0.8 MG tablet Take 1 tablet by mouth daily.      Julianne Handler, CNM 01/16/2017, 12:48 PM

## 2017-01-16 NOTE — MAU Note (Signed)
No movement in 2 days.  First baby, doesn't really know what is going on.  Denies pain, bleeding or leaking.

## 2017-01-23 ENCOUNTER — Ambulatory Visit (INDEPENDENT_AMBULATORY_CARE_PROVIDER_SITE_OTHER): Payer: Medicaid Other | Admitting: Student

## 2017-01-23 ENCOUNTER — Other Ambulatory Visit (HOSPITAL_COMMUNITY)
Admission: RE | Admit: 2017-01-23 | Discharge: 2017-01-23 | Disposition: A | Discharge status: Home / Self Care | Payer: Medicaid Other | Source: Ambulatory Visit | Attending: Student | Admitting: Student

## 2017-01-23 VITALS — BP 117/56 | HR 83 | Wt 227.1 lb

## 2017-01-23 DIAGNOSIS — Z3480 Encounter for supervision of other normal pregnancy, unspecified trimester: Secondary | ICD-10-CM | POA: Insufficient documentation

## 2017-01-23 DIAGNOSIS — O24419 Gestational diabetes mellitus in pregnancy, unspecified control: Secondary | ICD-10-CM

## 2017-01-23 DIAGNOSIS — O24410 Gestational diabetes mellitus in pregnancy, diet controlled: Secondary | ICD-10-CM

## 2017-01-23 DIAGNOSIS — A749 Chlamydial infection, unspecified: Secondary | ICD-10-CM

## 2017-01-23 MED ORDER — ASPIRIN EC 81 MG PO TBEC: 81.0000 mg | DELAYED_RELEASE_TABLET | Freq: Every day | ORAL | 5 refills | Status: DC

## 2017-01-23 NOTE — Progress Notes (Signed)
PRENATAL VISIT NOTE  Subjective:  Mary Davies is a 27 y.o. G2P0010 at [redacted]w[redacted]d being seen today for ongoing prenatal care.  She is currently monitored for the following issues for this low-risk pregnancy and has Tobacco abuse; Supervision of other normal pregnancy, antepartum; Asthma; Chlamydia infection; Rh negative status during pregnancy; Abnormal O'Sullivan glucose challenge test, antepartum; and Gestational diabetes mellitus (GDM) affecting pregnancy on their problem list.  Patient reports no complaints regarding pregnancy, though has been experiencing worsening asthma symptoms since moving into her new apartment approximately a month ago. She states the apartment has a mold problem which she believes is the cause of her symptoms. She has had to use her albuterol rescue inhaler multiple times a day for her symptoms. Per the patient, she is moving apartments this week. The patient also has an itchy rash on her lower extremities which she believes is dry skin secondary to frequent bathing. The pruritis is relieved with lotion and exacerbated by warm water.  Contractions: Not present. Vag. Bleeding: None.  Movement: Present. Denies leaking of fluid.   She says that both she and her partner have completed their chlamydia treatment.   She is planning to attend her diabetic class next week.   The following portions of the patient's history were reviewed and updated as appropriate: allergies, current medications, past family history, past medical history, past social history, past surgical history and problem list. Problem list updated.  Objective:   Vitals:   01/23/17 1415  BP: (!) 117/56  Pulse: 83  Weight: 227 lb 1.6 oz (103 kg)    Fetal Status: Fetal Heart Rate (bpm): 145   Movement: Present     General:  Alert, oriented and cooperative. Patient is in no acute distress.  Skin: Skin is warm and dry. No rash noted.   Cardiovascular: Normal heart rate noted  Respiratory: Normal  respiratory effort, no problems with respiration noted  Abdomen: Soft, gravid, appropriate for gestational age.  Pain/Pressure: Present     Pelvic: Cervical exam deferred        Extremities: Normal range of motion.  Edema: None. Lower extremities with dry, scaly skin and excoriations.    Mental Status:  Normal mood and affect. Normal behavior. Normal judgment and thought content.   Assessment and Plan:  Pregnancy: G2P0010 at [redacted]w[redacted]d  1. Supervision of other normal pregnancy, antepartum - [place] MFM OB FOLLOW UP; Future - GC/Chlamydia probe amp ([place])not at [facility]  2. Diet controlled gestational diabetes mellitus (GDM), antepartum Diagnosis of gestational diabetes was discussed today.  - Ambulatory referral to Ophthalmology - Ambulatory referral to Pediatric Cardiology -Diabetic class scheduled for next week. -RX for baby ASA sent to pharmacy on file.  3. Atopic Dermatitis Patient's dry, pruritic skin is consistent with atopic dermatitis. She was advised to avoid hot baths and to apply emollients frequently.   4. Asthma Patient states she will be moving out of the mold-infested apartment in the coming days. She was advised to return to the clinic if her asthma symptoms do not improve. If she feels she is having an asthma attack, patient advised to go to [facility] ED.    Preterm labor symptoms and general obstetric precautions including but not limited to vaginal bleeding, contractions, leaking of fluid and fetal movement were reviewed in detail with the patient. Please refer to After Visit Summary for other counseling recommendations.  Return in about 2 weeks (around 02/06/2017).   Francie Massing, Medical Student   I confirm that  I have verified the information documented in the student's note and that I have also personally reperformed the physical exam and all medical decision making activities.  Luna KitchensKathryn Marenda Accardi

## 2017-01-23 NOTE — Patient Instructions (Signed)
Gestational Diabetes Mellitus, Self Care When you have gestational diabetes (gestational diabetes mellitus), you must keep your blood sugar (glucose) under control. You can do this with:  Nutrition.  Exercise.  Lifestyle changes.  Medicines or insulin, if needed.  Support from your doctors and others.  How do I manage my blood sugar?  Check your blood sugar every day during pregnancy. Check it as often as told.  Call your doctor if your blood sugar is above your goal numbers for 2 tests in a row. Your doctor will set treatment goals for you. Try to have these blood sugars:  After not eating for a long time (after fasting): at or below 95 mg/dL (5.3 mmol/L).  After meals (postprandial): ? One hour after a meal: at or below 140 mg/dL (7.8 mmol/L). ? Two hours after a meal: at or below 120 mg/dL (6.7 mmol/L).  A1c (hemoglobin A1c) level: 6-6.5%.  What do I need to know about high blood sugar? High blood sugar is called hyperglycemia. Know the early signs of high blood sugar. Signs include:  Feeling: ? Thirsty. ? Hungry. ? Very tired.  Needing to pee (urinate) more than usual.  Blurry vision.  What do I need to know about low blood sugar? Low blood sugar is called hypoglycemia. This is when blood sugar is at or below 70 mg/dL (3.9 mmol/L). Symptoms may include:  Feeling: ? Hungry. ? Worried or nervous (anxious). ? Sweaty or clammy. ? Confused. ? Dizzy. ? Sleepy. ? Sick to your stomach (nauseous).  Having: ? A fast heartbeat. ? A headache. ? A change in vision. ? Jerky movements that you cannot control (seizure). ? Nightmares. ? Tingling or no feeling (numbness) around the mouth, lips, or tongue.  Having trouble with: ? Talking. ? Paying attention (concentrating). ? Moving (coordination). ? Sleeping.  Shaking.  Passing out (fainting).  Getting upset easily (irritability).  Treating low blood sugar  To treat low blood sugar, eat or drink something  sugary right away. If you can think clearly and swallow safely, follow the 15:15 rule:  Take 15 grams of a fast-acting carb (carbohydrate). Some fast-acting carbs are: ? 1 tube of glucose gel. ? 3 sugar tablets (glucose pills). ? 6-8 pieces of hard candy. ? 4 oz (120 mL) of fruit juice. ? 4 oz (120 mL) regular (not diet) soda.  Check your blood sugar 15 minutes after you take the carb.  If your blood sugar is still at or below 70 mg/dL (3.9 mmol/L), take 15 grams of a carb again.  If your blood sugar does not go above 70 mg/dL (3.9 mmol/L) after 3 tries, get help right away.  After your blood sugar goes back to normal, eat a meal or a snack within 1 hour.  Treating very low blood sugar If your blood sugar is at or below 54 mg/dL (3 mmol/L), you have very low blood sugar (severe hypoglycemia). This is an emergency. Do not wait to see if the symptoms will go away. Get medical help right away. Call your local emergency services (911 in the [place] Do not drive yourself to the hospital. If you have very low blood sugar and you cannot eat or drink, you may need a glucagon shot (injection). A family member or friend should learn:  How to check your blood sugar.  How to give you a glucagon shot.  Ask your doctor if you need a glucagon shot kit at home. What else is important to manage my diabetes? Medicine  Take your insulin and diabetes medicines as told.  Do not run out of insulin or medicines.  Adjust your insulin and diabetes medicines as told. Food   Make healthy food choices. These include: ? Chicken, fish, egg whites, and beans. ? Oats, whole wheat, bulgur, brown rice, quinoa, and millet. ? Fresh fruits and vegetables. ? Low-fat dairy products. ? Nuts, avocado, olive oil, and canola oil.  Make an eating plan. A food specialist (dietitian) can help you.  Follow instructions from your doctor about what you cannot eat or drink.  Drink enough fluid to keep your pee (urine)  clear or pale yellow.  Eat healthy snacks between healthy meals.  Keep track of carbs you eat. Read food labels. Learn about food serving sizes.  Follow your sick day plan when you cannot eat or drink normally. Make this plan with your doctor. Activity  Exercise 30 minutes or more a day or as much as told by your doctor.  Talk with your doctor if you start a new exercise. Your doctor may need to adjust your insulin, medicines, or food. Lifestyle  Do not drink alcohol.  Do not use any tobacco products. If you need help quitting, ask your doctor.  Learn how to deal with stress. If you need help with this, ask your doctor. Body care   Stay up to date with your shots (immunizations).  Brush your teeth and gums two times a day. Floss at least one time a day.  Go to the dentist least one time every 6 months.  Stay at a healthy weight while you are pregnant. General instructions  Take over-the-counter and prescription medicines only as told by your doctor.  Ask your doctor about risks of high blood pressure in pregnancy. These are called preeclampsia and eclampsia.  Share your diabetes care plan with: ? Your work or school. ? People you live with.  Check your pee for ketones: ? When you are sick. ? As told by your doctor.  Ask your doctor: ? Do I need to meet with a diabetes educator? ? Where can I find a support group for people with diabetes?  Carry a card or wear jewelry that says that you have diabetes.  Keep all follow-up visits with your doctor. This is important. Care after giving birth   Have your blood sugar checked 4-12 weeks after you give birth.  Get checked for diabetes at least every 3 years. Where to find more information: To learn more about diabetes, visit:  American Diabetes Association: www.diabetes.org/diabetes-basics/gestational  Centers for Disease Control and Prevention (CDC): http://sanchez-watson.com/.pdf  This  information is not intended to replace advice given to you by your health care provider. Make sure you discuss any questions you have with your health care provider. Document Released: 04/24/2015 Document Revised: 06/08/2015 Document Reviewed: 02/03/2015 Elsevier Interactive Patient Education  [employer].

## 2017-01-24 ENCOUNTER — Telehealth: Payer: Self-pay

## 2017-01-24 LAB — URINE CYTOLOGY ANCILLARY ONLY
Chlamydia: NEGATIVE
Neisseria Gonorrhea: NEGATIVE

## 2017-01-24 NOTE — Telephone Encounter (Signed)
Called pt to inform her of her eye dr. appt and Fetal echo. Got vm. Left message for pt to call if she has any questions.

## 2017-01-24 NOTE — Telephone Encounter (Signed)
Called about future sche

## 2017-01-28 ENCOUNTER — Encounter: Payer: Medicaid Other | Attending: Obstetrics & Gynecology | Admitting: *Deleted

## 2017-01-28 ENCOUNTER — Ambulatory Visit: Payer: Medicaid Other | Admitting: *Deleted

## 2017-01-28 ENCOUNTER — Telehealth: Payer: Self-pay

## 2017-01-28 DIAGNOSIS — R7302 Impaired glucose tolerance (oral): Secondary | ICD-10-CM | POA: Diagnosis not present

## 2017-01-28 DIAGNOSIS — Z713 Dietary counseling and surveillance: Secondary | ICD-10-CM | POA: Insufficient documentation

## 2017-01-28 DIAGNOSIS — R7309 Other abnormal glucose: Secondary | ICD-10-CM

## 2017-01-28 NOTE — Telephone Encounter (Signed)
Called pt and left message about cancelled appts for Fetal echo and eye exam.

## 2017-01-28 NOTE — Progress Notes (Signed)
  Patient was seen on 01/28/2017 for Gestational Diabetes self-management . Patient states no history of GDM, this is her first pregnancy. EDD 05/12/2017. She states her Rx for meter is already called into pharmacy, she will pick it up in the next couple of days. Diet history indicates usually only one meal a day in the evening, and snacks occasionally during the day. She is concerned about ability to get food once her food stamps run out with the government shutdown. She does walk at least an hour most days. She is taking classes from 9-2 PM that allow her apartment to be paid for so they can save up money for their own apartment. She does not have a car, takes the bus to get to appointments. The following learning objectives were met by the patient :   States the definition of Gestational Diabetes  States why dietary management is important in controlling blood glucose  Describes the effects of carbohydrates on blood glucose levels  Demonstrates ability to create a balanced meal plan  Demonstrates carbohydrate counting   States when to check blood glucose levels  Demonstrates proper blood glucose monitoring techniques  States the effect of stress and exercise on blood glucose levels  States the importance of limiting caffeine and abstaining from alcohol and smoking  Plan:  Aim for 3 Carb Choices per meal (45 grams) +/- 1 either way  Aim for 1-2 Carbs per snack Begin reading food labels for Total Carbohydrate of foods Consider  increasing your activity level by walking or other activity daily as tolerated Begin checking BG before breakfast and 2 hours after first bite of breakfast, lunch and dinner as directed by MD  Bring Log Book to every medical appointment   Take medication if directed by MD  Blood glucose monitor Rx already called into pharmacy: Accu Check Guide with Fast Clix drums Patient instructed to test pre breakfast and 2 hours each meal as directed by MD  Patient  instructed to monitor glucose levels: FBS: 60 - 95 mg/dl 2 hour: <120 mg/dl  Patient received the following handouts:  Nutrition Diabetes and Pregnancy  Carbohydrate Counting List  Patient will be seen for follow-up as needed.

## 2017-01-29 ENCOUNTER — Ambulatory Visit (HOSPITAL_COMMUNITY)
Admission: RE | Admit: 2017-01-29 | Discharge: 2017-01-29 | Disposition: A | Discharge status: Home / Self Care | Payer: Medicaid Other | Source: Ambulatory Visit | Attending: Student | Admitting: Student

## 2017-01-29 ENCOUNTER — Other Ambulatory Visit: Payer: Self-pay | Admitting: Student

## 2017-01-29 DIAGNOSIS — O09899 Supervision of other high risk pregnancies, unspecified trimester: Secondary | ICD-10-CM | POA: Insufficient documentation

## 2017-01-29 DIAGNOSIS — Z3480 Encounter for supervision of other normal pregnancy, unspecified trimester: Secondary | ICD-10-CM

## 2017-01-29 DIAGNOSIS — Z3A25 25 weeks gestation of pregnancy: Secondary | ICD-10-CM | POA: Insufficient documentation

## 2017-01-29 DIAGNOSIS — Z6791 Unspecified blood type, Rh negative: Secondary | ICD-10-CM | POA: Diagnosis not present

## 2017-01-29 DIAGNOSIS — O99212 Obesity complicating pregnancy, second trimester: Secondary | ICD-10-CM | POA: Insufficient documentation

## 2017-01-29 DIAGNOSIS — Z0489 Encounter for examination and observation for other specified reasons: Secondary | ICD-10-CM

## 2017-01-29 DIAGNOSIS — O26899 Other specified pregnancy related conditions, unspecified trimester: Secondary | ICD-10-CM

## 2017-01-29 DIAGNOSIS — IMO0002 Reserved for concepts with insufficient information to code with codable children: Secondary | ICD-10-CM

## 2017-01-29 DIAGNOSIS — Z362 Encounter for other antenatal screening follow-up: Secondary | ICD-10-CM | POA: Diagnosis not present

## 2017-02-06 ENCOUNTER — Encounter: Payer: Medicaid Other | Admitting: Family Medicine

## 2017-02-17 ENCOUNTER — Ambulatory Visit (INDEPENDENT_AMBULATORY_CARE_PROVIDER_SITE_OTHER): Payer: Medicaid Other | Admitting: Family Medicine

## 2017-02-17 VITALS — BP 118/79 | HR 89 | Wt 232.7 lb

## 2017-02-17 DIAGNOSIS — O26899 Other specified pregnancy related conditions, unspecified trimester: Principal | ICD-10-CM

## 2017-02-17 DIAGNOSIS — Z3480 Encounter for supervision of other normal pregnancy, unspecified trimester: Secondary | ICD-10-CM

## 2017-02-17 DIAGNOSIS — Z6791 Unspecified blood type, Rh negative: Secondary | ICD-10-CM | POA: Diagnosis not present

## 2017-02-17 DIAGNOSIS — Z23 Encounter for immunization: Secondary | ICD-10-CM | POA: Diagnosis not present

## 2017-02-17 DIAGNOSIS — O24419 Gestational diabetes mellitus in pregnancy, unspecified control: Secondary | ICD-10-CM

## 2017-02-17 DIAGNOSIS — O09893 Supervision of other high risk pregnancies, third trimester: Secondary | ICD-10-CM

## 2017-02-17 LAB — POCT URINALYSIS DIP (DEVICE)
BILIRUBIN URINE: NEGATIVE
Glucose, UA: NEGATIVE mg/dL
Hgb urine dipstick: NEGATIVE
LEUKOCYTES UA: NEGATIVE
Nitrite: NEGATIVE
PH: 5.5 (ref 5.0–8.0)
Protein, ur: NEGATIVE mg/dL
UROBILINOGEN UA: 0.2 mg/dL (ref 0.0–1.0)

## 2017-02-17 MED ORDER — RHO D IMMUNE GLOBULIN 1500 UNIT/2ML IJ SOSY
300.0000 ug | PREFILLED_SYRINGE | Freq: Once | INTRAMUSCULAR | Status: AC
  Administered 2017-02-17: 300 ug via INTRAMUSCULAR

## 2017-02-17 NOTE — Progress Notes (Signed)
Subjective:  Mary Davies is a 27 y.o. G2P0010 at 765w0d being seen today for ongoing prenatal care.  She is currently monitored for the following issues for this high-risk pregnancy and has Tobacco abuse; Supervision of other normal pregnancy, antepartum; Asthma; Chlamydia infection; Rh negative status during pregnancy; Abnormal O'Sullivan glucose challenge test, antepartum; and Gestational diabetes mellitus (GDM) affecting pregnancy on their problem list.  GDM: Patient diet controlled. Has not been checking CBGs because she was told at the pharmacy that her medicaid did not cover the glucometer.   Patient reports no complaints.  Contractions: Not present. Vag. Bleeding: None.  Movement: Present. Denies leaking of fluid.   The following portions of the patient's history were reviewed and updated as appropriate: allergies, current medications, past family history, past medical history, past social history, past surgical history and problem list. Problem list updated.  Objective:   Vitals:   02/17/17 1328  BP: 118/79  Pulse: 89  Weight: 232 lb 11.2 oz (105.6 kg)    Fetal Status: Fetal Heart Rate (bpm): 142   Movement: Present     General:  Alert, oriented and cooperative. Patient is in no acute distress.  Skin: Skin is warm and dry. No rash noted.   Cardiovascular: Normal heart rate noted  Respiratory: Normal respiratory effort, no problems with respiration noted  Abdomen: Soft, gravid, appropriate for gestational age. Pain/Pressure: Absent     Pelvic: Vag. Bleeding: None     Cervical exam deferred        Extremities: Normal range of motion.  Edema: Trace  Mental Status: Normal mood and affect. Normal behavior. Normal judgment and thought content.   Urinalysis:      Assessment and Plan:  Pregnancy: G2P0010 at 4865w0d  1. Supervision of other normal pregnancy, antepartum FHT and FH normal - CBC - RPR - HIV antibody (with reflex) - Tdap vaccine greater than or equal to 7yo  IM  2. Rh negative, antepartum - rho (d) immune globulin (RHIG/RHOPHYLAC) injection 300 mcg  3. Gestational diabetes mellitus (GDM) affecting pregnancy Called pharmacy - they are correcting the problem.  F/u in 2 weeks  Preterm labor symptoms and general obstetric precautions including but not limited to vaginal bleeding, contractions, leaking of fluid and fetal movement were reviewed in detail with the patient. Please refer to After Visit Summary for other counseling recommendations.  No Follow-up on file.   Levie HeritageStinson, Casara Perrier J, DO

## 2017-02-18 LAB — CBC
HEMATOCRIT: 35.4 % (ref 34.0–46.6)
HEMOGLOBIN: 11.8 g/dL (ref 11.1–15.9)
MCH: 32.3 pg (ref 26.6–33.0)
MCHC: 33.3 g/dL (ref 31.5–35.7)
MCV: 97 fL (ref 79–97)
Platelets: 266 10*3/uL (ref 150–379)
RBC: 3.65 x10E6/uL — AB (ref 3.77–5.28)
RDW: 14 % (ref 12.3–15.4)
WBC: 7.4 10*3/uL (ref 3.4–10.8)

## 2017-02-18 LAB — HIV ANTIBODY (ROUTINE TESTING W REFLEX): HIV SCREEN 4TH GENERATION: NONREACTIVE

## 2017-02-18 LAB — RPR: RPR: NONREACTIVE

## 2017-03-10 ENCOUNTER — Ambulatory Visit (INDEPENDENT_AMBULATORY_CARE_PROVIDER_SITE_OTHER): Payer: Medicaid Other | Admitting: Family Medicine

## 2017-03-10 VITALS — BP 120/72 | HR 78 | Wt 229.0 lb

## 2017-03-10 DIAGNOSIS — O26849 Uterine size-date discrepancy, unspecified trimester: Secondary | ICD-10-CM

## 2017-03-10 DIAGNOSIS — O09893 Supervision of other high risk pregnancies, third trimester: Secondary | ICD-10-CM

## 2017-03-10 DIAGNOSIS — O24419 Gestational diabetes mellitus in pregnancy, unspecified control: Secondary | ICD-10-CM

## 2017-03-10 DIAGNOSIS — Z3480 Encounter for supervision of other normal pregnancy, unspecified trimester: Secondary | ICD-10-CM

## 2017-03-10 DIAGNOSIS — Z6791 Unspecified blood type, Rh negative: Secondary | ICD-10-CM

## 2017-03-10 DIAGNOSIS — O26893 Other specified pregnancy related conditions, third trimester: Secondary | ICD-10-CM

## 2017-03-10 NOTE — Progress Notes (Signed)
Subjective:  [name] is a 27 y.o. G2P0010 at 5941w0d being seen today for ongoing prenatal care.  She is currently monitored for the following issues for this high-risk pregnancy and has Tobacco abuse; Supervision of other normal pregnancy, antepartum; Asthma; Chlamydia infection; Rh negative status during pregnancy; Abnormal O'Sullivan glucose challenge test, antepartum; and Gestational diabetes mellitus (GDM) affecting pregnancy on their problem list.  GDM: Patient diet controlled.  Reports no hypoglycemic episodes.  Fasting: mostly controlled - one elevated 2hr PP: mostly controlled - 2-3 elevated  Patient reports no complaints.  Contractions: Not present. Vag. Bleeding: None.  Movement: Present. Denies leaking of fluid.   The following portions of the patient's history were reviewed and updated as appropriate: allergies, current medications, past family history, past medical history, past social history, past surgical history and problem list. Problem list updated.  Objective:   Vitals:   03/10/17 1331  BP: 120/72  Pulse: 78  Weight: 229 lb (103.9 kg)    Fetal Status: Fetal Heart Rate (bpm): 138   Movement: Present     General:  Alert, oriented and cooperative. Patient is in no acute distress.  Skin: Skin is warm and dry. No rash noted.   Cardiovascular: Normal heart rate noted  Respiratory: Normal respiratory effort, no problems with respiration noted  Abdomen: Soft, gravid, appropriate for gestational age. Pain/Pressure: Present     Pelvic: Vag. Bleeding: None     Cervical exam deferred        Extremities: Normal range of motion.  Edema: None  Mental Status: Normal mood and affect. Normal behavior. Normal judgment and thought content.   Urinalysis:      Assessment and Plan:  Pregnancy: G2P0010 at 8541w0d  1. Supervision of other normal pregnancy, antepartum FHT and FH normal  2. Gestational diabetes mellitus (GDM) affecting pregnancy Mostly controlled. Discussed  better compliance with consistency of checking either 1hr after eating or 2hr after eating.  3. Rh negative status during pregnancy in third trimester Rhogam already given.  4. Fetal size inconsistent with dates F/u US for growth  Preterm labor symptoms and general obstetric precautions including but not limited to vaginal bleeding, contractions, leaking of fluid and fetal movement were reviewed in detail with the patient. Please refer to After Visit Summary for other counseling recommendations.  No Follow-up on file.   Levie HeritageStinson, Sharmane Dame J, DO

## 2017-03-17 ENCOUNTER — Other Ambulatory Visit: Payer: Self-pay | Admitting: Family Medicine

## 2017-03-17 ENCOUNTER — Ambulatory Visit (HOSPITAL_COMMUNITY)
Admission: RE | Admit: 2017-03-17 | Discharge: 2017-03-17 | Disposition: A | Discharge status: Home / Self Care | Payer: Medicaid Other | Source: Ambulatory Visit | Attending: Family Medicine | Admitting: Family Medicine

## 2017-03-17 DIAGNOSIS — Z3A32 32 weeks gestation of pregnancy: Secondary | ICD-10-CM

## 2017-03-17 DIAGNOSIS — O26849 Uterine size-date discrepancy, unspecified trimester: Secondary | ICD-10-CM

## 2017-03-17 DIAGNOSIS — O99213 Obesity complicating pregnancy, third trimester: Secondary | ICD-10-CM | POA: Diagnosis not present

## 2017-03-17 DIAGNOSIS — Z362 Encounter for other antenatal screening follow-up: Secondary | ICD-10-CM

## 2017-03-17 NOTE — Addendum Note (Signed)
Encounter addended by: Lenise ArenaBazemore, Kyla Duffy, RDMS on: 03/17/2017 9:18 AM  Actions taken: Imaging Exam ended

## 2017-03-26 ENCOUNTER — Encounter: Payer: Medicaid Other | Admitting: Obstetrics and Gynecology

## 2017-03-28 ENCOUNTER — Ambulatory Visit (INDEPENDENT_AMBULATORY_CARE_PROVIDER_SITE_OTHER): Payer: Medicaid Other | Admitting: Family Medicine

## 2017-03-28 ENCOUNTER — Encounter: Payer: Self-pay | Admitting: Family Medicine

## 2017-03-28 VITALS — BP 114/70 | HR 76 | Wt 237.0 lb

## 2017-03-28 DIAGNOSIS — O26893 Other specified pregnancy related conditions, third trimester: Secondary | ICD-10-CM

## 2017-03-28 DIAGNOSIS — O24419 Gestational diabetes mellitus in pregnancy, unspecified control: Secondary | ICD-10-CM

## 2017-03-28 DIAGNOSIS — Z6791 Unspecified blood type, Rh negative: Secondary | ICD-10-CM

## 2017-03-28 DIAGNOSIS — O09893 Supervision of other high risk pregnancies, third trimester: Secondary | ICD-10-CM

## 2017-03-28 DIAGNOSIS — Z3480 Encounter for supervision of other normal pregnancy, unspecified trimester: Secondary | ICD-10-CM

## 2017-03-28 NOTE — Progress Notes (Addendum)
Subjective:  Mary Davies is a 27 y.o. G2P0010 at 2362w4d being seen today for ongoing prenatal care.  She is currently monitored for the following issues for this high-risk pregnancy and has Tobacco abuse; Supervision of other normal pregnancy, antepartum; Asthma; Chlamydia infection; Rh negative status during pregnancy; and Gestational diabetes mellitus (GDM) affecting pregnancy on their problem list.  GDM: Patient diet controlle4d.  Reports no hypoglycemic episodes.   Fasting: 4 elevated CBGs 2hr PP: 4 elevated  Patient reports no complaints.  Contractions: Irregular. Vag. Bleeding: None.  Movement: Present. Denies leaking of fluid.   The following portions of the patient's history were reviewed and updated as appropriate: allergies, current medications, past family history, past medical history, past social history, past surgical history and problem list. Problem list updated.  Objective:   Vitals:   03/28/17 1003  BP: 114/70  Pulse: 76  Weight: 237 lb (107.5 kg)    Fetal Status: Fetal Heart Rate (bpm): 140 Fundal Height: 34 cm Movement: Present     General:  Alert, oriented and cooperative. Patient is in no acute distress.  Skin: Skin is warm and dry. No rash noted.   Cardiovascular: Normal heart rate noted  Respiratory: Normal respiratory effort, no problems with respiration noted  Abdomen: Soft, gravid, appropriate for gestational age. Pain/Pressure: Present     Pelvic: Vag. Bleeding: None     Cervical exam deferred        Extremities: Normal range of motion.  Edema: None  Mental Status: Normal mood and affect. Normal behavior. Normal judgment and thought content.   Urinalysis:      Assessment and Plan:  Pregnancy: G2P0010 at 962w4d  1. Supervision of other normal pregnancy, antepartum FHT normal  2. Gestational diabetes mellitus (GDM) affecting pregnancy Continue CBGs Induce at 40 weeks if remains diet controlled  3. Rh negative status during pregnancy in third  trimester Rhogam already given  Preterm labor symptoms and general obstetric precautions including but not limited to vaginal bleeding, contractions, leaking of fluid and fetal movement were reviewed in detail with the patient. Please refer to After Visit Summary for other counseling recommendations.  Return in about 2 weeks (around 04/11/2017) for [place] OB f/u.   Levie HeritageStinson, Jacob J, DO

## 2017-04-11 ENCOUNTER — Other Ambulatory Visit (HOSPITAL_COMMUNITY)
Admission: RE | Admit: 2017-04-11 | Discharge: 2017-04-11 | Disposition: A | Discharge status: Home / Self Care | Payer: Medicaid Other | Source: Ambulatory Visit | Attending: Obstetrics & Gynecology | Admitting: Obstetrics & Gynecology

## 2017-04-11 ENCOUNTER — Ambulatory Visit (INDEPENDENT_AMBULATORY_CARE_PROVIDER_SITE_OTHER): Payer: Medicaid Other | Admitting: Obstetrics & Gynecology

## 2017-04-11 VITALS — BP 122/77 | HR 68 | Wt 237.6 lb

## 2017-04-11 DIAGNOSIS — O0993 Supervision of high risk pregnancy, unspecified, third trimester: Secondary | ICD-10-CM

## 2017-04-11 DIAGNOSIS — A749 Chlamydial infection, unspecified: Secondary | ICD-10-CM

## 2017-04-11 DIAGNOSIS — O24419 Gestational diabetes mellitus in pregnancy, unspecified control: Secondary | ICD-10-CM

## 2017-04-11 DIAGNOSIS — Z8619 Personal history of other infectious and parasitic diseases: Secondary | ICD-10-CM | POA: Insufficient documentation

## 2017-04-11 DIAGNOSIS — Z09 Encounter for follow-up examination after completed treatment for conditions other than malignant neoplasm: Secondary | ICD-10-CM | POA: Insufficient documentation

## 2017-04-11 LAB — POCT URINALYSIS DIP (DEVICE)
Glucose, UA: NEGATIVE mg/dL
HGB URINE DIPSTICK: NEGATIVE
Ketones, ur: NEGATIVE mg/dL
Leukocytes, UA: NEGATIVE
NITRITE: NEGATIVE
PH: 6.5 (ref 5.0–8.0)
PROTEIN: 30 mg/dL — AB
Specific Gravity, Urine: >=1.03 (ref 1.005–1.030)
Urobilinogen, UA: 1 mg/dL (ref 0.0–1.0)

## 2017-04-11 LAB — OB RESULTS CONSOLE GC/CHLAMYDIA: Gonorrhea: NEGATIVE

## 2017-04-11 LAB — OB RESULTS CONSOLE GBS: STREP GROUP B AG: NEGATIVE

## 2017-04-11 NOTE — Progress Notes (Signed)
   PRENATAL VISIT NOTE  Subjective:  Mary Davies is a 27 y.o. G2P0010 at 2927w4d being seen today for ongoing prenatal care.  She is currently monitored for the following issues for this high-risk pregnancy and has Tobacco abuse; Supervision of high-risk pregnancy; Asthma; Chlamydia infection; Rh negative status during pregnancy; and Gestational diabetes mellitus (GDM) affecting pregnancy on their problem list.  Patient reports no complaints.  Contractions: Not present. Vag. Bleeding: None.  Movement: Present. Denies leaking of fluid.   The following portions of the patient's history were reviewed and updated as appropriate: allergies, current medications, past family history, past medical history, past social history, past surgical history and problem list. Problem list updated.  Objective:   Vitals:   04/11/17 0840  BP: 122/77  Pulse: 68  Weight: 237 lb 9.6 oz (107.8 kg)    Fetal Status: Fetal Heart Rate (bpm):  135  Fundal Height: 38 cm Movement: Present  Presentation: Vertex  General:  Alert, oriented and cooperative. Patient is in no acute distress.  Skin: Skin is warm and dry. No rash noted.   Cardiovascular: Normal heart rate noted  Respiratory: Normal respiratory effort, no problems with respiration noted  Abdomen: Soft, gravid, appropriate for gestational age.  Pain/Pressure: Present     Pelvic: Cervical exam performed Dilation: Closed Effacement (%): Thick Station: Ballotable  Extremities: Normal range of motion.  Edema: None  Mental Status:  Normal mood and affect. Normal behavior. Normal judgment and thought content.   Assessment and Plan:  Pregnancy: G2P0010 at 2927w4d  1. Gestational diabetes mellitus (GDM) affecting pregnancy Stable CBGs, diet and exercise controlled. Growth scan ordered for ~38 weeks - [place] MFM OB FOLLOW UP; Future  2. Chlamydia infection TOC done today - [facility]  3. Supervision of high risk pregnancy in  third trimester Pelvic cultures today - Culture, beta strep (group b only) - GC/Chlamydia probe amp ([place] - POCT urinalysis dip (device)  Preterm labor symptoms and general obstetric precautions including but not limited to vaginal bleeding, contractions, leaking of fluid and fetal movement were reviewed in detail with the patient. Please refer to After Visit Summary for other counseling recommendations.  Return in about 1 week (around 04/18/2017) for OB Visit (HOB).   Jaynie CollinsUgonna Maryem Shuffler, MD

## 2017-04-11 NOTE — Patient Instructions (Addendum)
Return to clinic for any scheduled appointments or obstetric concerns, or go to [facility] for evaluation  

## 2017-04-15 LAB — CULTURE, BETA STREP (GROUP B ONLY): STREP GP B CULTURE: NEGATIVE

## 2017-04-15 LAB — GC/CHLAMYDIA PROBE AMP (~~LOC~~) NOT AT ARMC
CHLAMYDIA, DNA PROBE: NEGATIVE
NEISSERIA GONORRHEA: NEGATIVE

## 2017-04-21 ENCOUNTER — Ambulatory Visit (INDEPENDENT_AMBULATORY_CARE_PROVIDER_SITE_OTHER): Payer: Medicaid Other | Admitting: Family Medicine

## 2017-04-21 VITALS — BP 133/83 | HR 81 | Wt 239.3 lb

## 2017-04-21 DIAGNOSIS — O24419 Gestational diabetes mellitus in pregnancy, unspecified control: Secondary | ICD-10-CM

## 2017-04-21 DIAGNOSIS — O26849 Uterine size-date discrepancy, unspecified trimester: Secondary | ICD-10-CM

## 2017-04-21 DIAGNOSIS — Z6791 Unspecified blood type, Rh negative: Secondary | ICD-10-CM

## 2017-04-21 DIAGNOSIS — O0993 Supervision of high risk pregnancy, unspecified, third trimester: Secondary | ICD-10-CM

## 2017-04-21 DIAGNOSIS — O26893 Other specified pregnancy related conditions, third trimester: Secondary | ICD-10-CM

## 2017-04-21 NOTE — Progress Notes (Signed)
Subjective:  Mary Davies is a 27 y.o. G2P0010 at 7063w0d being seen today for ongoing prenatal care.  She is currently monitored for the following issues for this high-risk pregnancy and has Tobacco abuse; Supervision of high-risk pregnancy; Asthma; Chlamydia infection; Rh negative status during pregnancy; and Gestational diabetes mellitus (GDM) affecting pregnancy on their problem list.  GDM: Patient diet controlled. Fasting: controlled 2hr PP: Patient non compliant with diet - eating sweets. 7-8 CBGs elevated to 120-140s.  Patient reports no complaints.  Contractions: Irritability. Vag. Bleeding: None.  Movement: Present. Denies leaking of fluid.   The following portions of the patient's history were reviewed and updated as appropriate: allergies, current medications, past family history, past medical history, past social history, past surgical history and problem list. Problem list updated.  Objective:   Vitals:   04/21/17 1459  BP: 133/83  Pulse: 81  Weight: 239 lb 4.8 oz (108.5 kg)    Fetal Status: Fetal Heart Rate (bpm): 150 Fundal Height: 39 cm Movement: Present     General:  Alert, oriented and cooperative. Patient is in no acute distress.  Skin: Skin is warm and dry. No rash noted.   Cardiovascular: Normal heart rate noted  Respiratory: Normal respiratory effort, no problems with respiration noted  Abdomen: Soft, gravid, appropriate for gestational age. Pain/Pressure: Present     Pelvic: Vag. Bleeding: None     Cervical exam deferred        Extremities: Normal range of motion.  Edema: None  Mental Status: Normal mood and affect. Normal behavior. Normal judgment and thought content.   Urinalysis:      Assessment and Plan:  Pregnancy: G2P0010 at 5663w0d  1. Supervision of high risk pregnancy in third trimester FHT and FH normal  2. Gestational diabetes mellitus (GDM) affecting pregnancy Discussed compliance with diet. Has US on 4/12  3. Rh negative status during  pregnancy in third trimester Rhogam given  4. Fetal size inconsistent with dates  Term labor symptoms and general obstetric precautions including but not limited to vaginal bleeding, contractions, leaking of fluid and fetal movement were reviewed in detail with the patient. Please refer to After Visit Summary for other counseling recommendations.  No follow-ups on file.   Levie HeritageStinson, Shameika Speelman J, DO

## 2017-04-25 ENCOUNTER — Other Ambulatory Visit: Payer: Self-pay | Admitting: Obstetrics & Gynecology

## 2017-04-25 ENCOUNTER — Other Ambulatory Visit (HOSPITAL_COMMUNITY): Payer: Self-pay | Admitting: *Deleted

## 2017-04-25 ENCOUNTER — Other Ambulatory Visit (HOSPITAL_COMMUNITY): Payer: Self-pay | Admitting: Maternal and Fetal Medicine

## 2017-04-25 ENCOUNTER — Ambulatory Visit (HOSPITAL_COMMUNITY)
Admission: RE | Admit: 2017-04-25 | Discharge: 2017-04-25 | Disposition: A | Discharge status: Home / Self Care | Payer: Medicaid Other | Source: Ambulatory Visit | Attending: Obstetrics & Gynecology | Admitting: Obstetrics & Gynecology

## 2017-04-25 ENCOUNTER — Encounter (HOSPITAL_COMMUNITY): Payer: Self-pay

## 2017-04-25 ENCOUNTER — Ambulatory Visit (HOSPITAL_COMMUNITY)
Admission: RE | Admit: 2017-04-25 | Discharge: 2017-04-25 | Disposition: A | Discharge status: Home / Self Care | Payer: Medicaid Other | Source: Ambulatory Visit | Attending: Obstetrics and Gynecology | Admitting: Obstetrics and Gynecology

## 2017-04-25 DIAGNOSIS — O403XX0 Polyhydramnios, third trimester, not applicable or unspecified: Secondary | ICD-10-CM | POA: Diagnosis not present

## 2017-04-25 DIAGNOSIS — Z3A37 37 weeks gestation of pregnancy: Secondary | ICD-10-CM | POA: Diagnosis not present

## 2017-04-25 DIAGNOSIS — O99213 Obesity complicating pregnancy, third trimester: Secondary | ICD-10-CM

## 2017-04-25 DIAGNOSIS — E669 Obesity, unspecified: Secondary | ICD-10-CM | POA: Diagnosis not present

## 2017-04-25 DIAGNOSIS — O24419 Gestational diabetes mellitus in pregnancy, unspecified control: Secondary | ICD-10-CM

## 2017-04-25 DIAGNOSIS — O409XX0 Polyhydramnios, unspecified trimester, not applicable or unspecified: Secondary | ICD-10-CM

## 2017-04-25 DIAGNOSIS — Z362 Encounter for other antenatal screening follow-up: Secondary | ICD-10-CM | POA: Insufficient documentation

## 2017-04-25 NOTE — Procedures (Signed)
[name] 31-Mar-1990 5950w4d  Fetus A Non-Stress Test Interpretation for 04/25/17  Indication: baseline FHR  Fetal Heart Rate A Mode: External Baseline Rate (A): 120 bpm Variability: Moderate Accelerations: 15 x 15 Decelerations: None Multiple birth?: No  Uterine Activity Mode: Palpation, Toco Contraction Frequency (min): U/I  Interpretation (Fetal Testing) Nonstress Test Interpretation: Reactive Comments: Reviewed tracing with Dr. Sherrie Georgeecker

## 2017-04-28 ENCOUNTER — Encounter: Payer: Self-pay | Admitting: Obstetrics and Gynecology

## 2017-04-28 ENCOUNTER — Ambulatory Visit (INDEPENDENT_AMBULATORY_CARE_PROVIDER_SITE_OTHER): Payer: Medicaid Other | Admitting: Obstetrics and Gynecology

## 2017-04-28 VITALS — BP 103/73 | HR 79 | Wt 241.2 lb

## 2017-04-28 DIAGNOSIS — O409XX0 Polyhydramnios, unspecified trimester, not applicable or unspecified: Secondary | ICD-10-CM | POA: Insufficient documentation

## 2017-04-28 DIAGNOSIS — Z6791 Unspecified blood type, Rh negative: Secondary | ICD-10-CM

## 2017-04-28 DIAGNOSIS — O0993 Supervision of high risk pregnancy, unspecified, third trimester: Secondary | ICD-10-CM

## 2017-04-28 DIAGNOSIS — O24419 Gestational diabetes mellitus in pregnancy, unspecified control: Secondary | ICD-10-CM

## 2017-04-28 DIAGNOSIS — O26893 Other specified pregnancy related conditions, third trimester: Secondary | ICD-10-CM

## 2017-04-28 DIAGNOSIS — O403XX0 Polyhydramnios, third trimester, not applicable or unspecified: Secondary | ICD-10-CM

## 2017-04-28 NOTE — Progress Notes (Signed)
When dopplered FHTs FHR initially 147 and then down to 120 and 110 and then up to 130s and back to 110s. Cont to fluctuate between 120s and 140s. Pt reports good FM but states had to be monitored Friday due to FHR dropping some

## 2017-04-28 NOTE — Progress Notes (Signed)
Subjective:  Mary Davies is a 27 y.o. G2P0010 at 3917w0d being seen today for ongoing prenatal care.  She is currently monitored for the following issues for this high-risk pregnancy and has Tobacco abuse; Supervision of high-risk pregnancy; Asthma; Rh negative status during pregnancy; Gestational diabetes mellitus (GDM) affecting pregnancy; and Polyhydramnios on their problem list.  Patient reports no complaints.  Contractions: Not present. Vag. Bleeding: None.  Movement: Present. Denies leaking of fluid.   The following portions of the patient's history were reviewed and updated as appropriate: allergies, current medications, past family history, past medical history, past social history, past surgical history and problem list. Problem list updated.  Objective:   Vitals:   04/28/17 1416  BP: 103/73  Pulse: 79  Weight: 241 lb 3.2 oz (109.4 kg)    Fetal Status: Fetal Heart Rate (bpm): 140   Movement: Present     General:  Alert, oriented and cooperative. Patient is in no acute distress.  Skin: Skin is warm and dry. No rash noted.   Cardiovascular: Normal heart rate noted  Respiratory: Normal respiratory effort, no problems with respiration noted  Abdomen: Soft, gravid, appropriate for gestational age. Pain/Pressure: Absent     Pelvic:  Cervical exam deferred        Extremities: Normal range of motion.  Edema: Mild pitting, slight indentation  Mental Status: Normal mood and affect. Normal behavior. Normal judgment and thought content.   Urinalysis:      Assessment and Plan:  Pregnancy: G2P0010 at 9817w0d  1. Supervision of high risk pregnancy in third trimester Stable Labor precautions  2. Rh negative status during pregnancy in third trimester S/P Rhogam  3. Gestational diabetes mellitus (GDM) affecting pregnancy CBG's in goal range Continue with diet  4. Polyhydramnios in third trimester complication, single or unspecified fetus BPP this Friday  Term labor symptoms and  general obstetric precautions including but not limited to vaginal bleeding, contractions, leaking of fluid and fetal movement were reviewed in detail with the patient. Please refer to After Visit Summary for other counseling recommendations.  Return in about 1 week (around 05/05/2017) for OB visit.   Hermina StaggersErvin, Michael L, MD

## 2017-04-28 NOTE — Patient Instructions (Signed)
Vaginal Delivery Vaginal delivery means that you will give birth by pushing your baby out of your birth canal (vagina). A team of health care providers will help you before, during, and after vaginal delivery. Birth experiences are unique for every woman and every pregnancy, and birth experiences vary depending on where you choose to give birth. What should I do to prepare for my baby's birth? Before your baby is born, it is important to talk with your health care provider about:  Your labor and delivery preferences. These may include: ? Medicines that you may be given. ? How you will manage your pain. This might include non-medical pain relief techniques or injectable pain relief such as epidural analgesia. ? How you and your baby will be monitored during labor and delivery. ? Who may be in the labor and delivery room with you. ? Your feelings about surgical delivery of your baby (cesarean delivery, or C-section) if this becomes necessary. ? Your feelings about receiving donated blood through an IV tube (blood transfusion) if this becomes necessary.  Whether you are able: ? To take pictures or videos of the birth. ? To eat during labor and delivery. ? To move around, walk, or change positions during labor and delivery.  What to expect after your baby is born, such as: ? Whether delayed umbilical cord clamping and cutting is offered. ? Who will care for your baby right after birth. ? Medicines or tests that may be recommended for your baby. ? Whether breastfeeding is supported in your hospital or birth center. ? How long you will be in the hospital or birth center.  How any medical conditions you have may affect your baby or your labor and delivery experience.  To prepare for your baby's birth, you should also:  Attend all of your health care visits before delivery (prenatal visits) as recommended by your health care provider. This is important.  Prepare your home for your baby's  arrival. Make sure that you have: ? Diapers. ? Baby clothing. ? Feeding equipment. ? Safe sleeping arrangements for you and your baby.  Install a car seat in your vehicle. Have your car seat checked by a certified car seat installer to make sure that it is installed safely.  Think about who will help you with your new baby at home for at least the first several weeks after delivery.  What can I expect when I arrive at the birth center or hospital? Once you are in labor and have been admitted into the hospital or birth center, your health care provider may:  Review your pregnancy history and any concerns you have.  Insert an IV tube into one of your veins. This is used to give you fluids and medicines.  Check your blood pressure, pulse, temperature, and heart rate (vital signs).  Check whether your bag of water (amniotic sac) has broken (ruptured).  Talk with you about your birth plan and discuss pain control options.  Monitoring Your health care provider may monitor your contractions (uterine monitoring) and your baby's heart rate (fetal monitoring). You may need to be monitored:  Often, but not continuously (intermittently).  All the time or for long periods at a time (continuously). Continuous monitoring may be needed if: ? You are taking certain medicines, such as medicine to relieve pain or make your contractions stronger. ? You have pregnancy or labor complications.  Monitoring may be done by:  Placing a special stethoscope or a handheld monitoring device on your abdomen to   check your baby's heartbeat, and feeling your abdomen for contractions. This method of monitoring does not continuously record your baby's heartbeat or your contractions.  Placing monitors on your abdomen (external monitors) to record your baby's heartbeat and the frequency and length of contractions. You may not have to wear external monitors all the time.  Placing monitors inside of your uterus  (internal monitors) to record your baby's heartbeat and the frequency, length, and strength of your contractions. ? Your health care provider may use internal monitors if he or she needs more information about the strength of your contractions or your baby's heart rate. ? Internal monitors are put in place by passing a thin, flexible wire through your vagina and into your uterus. Depending on the type of monitor, it may remain in your uterus or on your baby's head until birth. ? Your health care provider will discuss the benefits and risks of internal monitoring with you and will ask for your permission before inserting the monitors.  Telemetry. This is a type of continuous monitoring that can be done with external or internal monitors. Instead of having to stay in bed, you are able to move around during telemetry. Ask your health care provider if telemetry is an option for you.  Physical exam Your health care provider may perform a physical exam. This may include:  Checking whether your baby is positioned: ? With the head toward your vagina (head-down). This is most common. ? With the head toward the top of your uterus (head-up or breech). If your baby is in a breech position, your health care provider may try to turn your baby to a head-down position so you can deliver vaginally. If it does not seem that your baby can be born vaginally, your provider may recommend surgery to deliver your baby. In rare cases, you may be able to deliver vaginally if your baby is head-up (breech delivery). ? Lying sideways (transverse). Babies that are lying sideways cannot be delivered vaginally.  Checking your cervix to determine: ? Whether it is thinning out (effacing). ? Whether it is opening up (dilating). ? How low your baby has moved into your birth canal.  What are the three stages of labor and delivery?  Normal labor and delivery is divided into the following three stages: Stage 1  Stage 1 is the  longest stage of labor, and it can last for hours or days. Stage 1 includes: ? Early labor. This is when contractions may be irregular, or regular and mild. Generally, early labor contractions are more than 10 minutes apart. ? Active labor. This is when contractions get longer, more regular, more frequent, and more intense. ? The transition phase. This is when contractions happen very close together, are very intense, and may last longer than during any other part of labor.  Contractions generally feel mild, infrequent, and irregular at first. They get stronger, more frequent (about every 2-3 minutes), and more regular as you progress from early labor through active labor and transition.  Many women progress through stage 1 naturally, but you may need help to continue making progress. If this happens, your health care provider may talk with you about: ? Rupturing your amniotic sac if it has not ruptured yet. ? Giving you medicine to help make your contractions stronger and more frequent.  Stage 1 ends when your cervix is completely dilated to 4 inches (10 cm) and completely effaced. This happens at the end of the transition phase. Stage 2  Once   your cervix is completely effaced and dilated to 4 inches (10 cm), you may start to feel an urge to push. It is common for the body to naturally take a rest before feeling the urge to push, especially if you received an epidural or certain other pain medicines. This rest period may last for up to 1-2 hours, depending on your unique labor experience.  During stage 2, contractions are generally less painful, because pushing helps relieve contraction pain. Instead of contraction pain, you may feel stretching and burning pain, especially when the widest part of your baby's head passes through the vaginal opening (crowning).  Your health care provider will closely monitor your pushing progress and your baby's progress through the vagina during stage 2.  Your  health care provider may massage the area of skin between your vaginal opening and anus (perineum) or apply warm compresses to your perineum. This helps it stretch as the baby's head starts to crown, which can help prevent perineal tearing. ? In some cases, an incision may be made in your perineum (episiotomy) to allow the baby to pass through the vaginal opening. An episiotomy helps to make the opening of the vagina larger to allow more room for the baby to fit through.  It is very important to breathe and focus so your health care provider can control the delivery of your baby's head. Your health care provider may have you decrease the intensity of your pushing, to help prevent perineal tearing.  After delivery of your baby's head, the shoulders and the rest of the body generally deliver very quickly and without difficulty.  Once your baby is delivered, the umbilical cord may be cut right away, or this may be delayed for 1-2 minutes, depending on your baby's health. This may vary among health care providers, hospitals, and birth centers.  If you and your baby are healthy enough, your baby may be placed on your chest or abdomen to help maintain the baby's temperature and to help you bond with each other. Some mothers and babies start breastfeeding at this time. Your health care team will dry your baby and help keep your baby warm during this time.  Your baby may need immediate care if he or she: ? Showed signs of distress during labor. ? Has a medical condition. ? Was born too early (prematurely). ? Had a bowel movement before birth (meconium). ? Shows signs of difficulty transitioning from being inside the uterus to being outside of the uterus. If you are planning to breastfeed, your health care team will help you begin a feeding. Stage 3  The third stage of labor starts immediately after the birth of your baby and ends after you deliver the placenta. The placenta is an organ that develops  during pregnancy to provide oxygen and nutrients to your baby in the womb.  Delivering the placenta may require some pushing, and you may have mild contractions. Breastfeeding can stimulate contractions to help you deliver the placenta.  After the placenta is delivered, your uterus should tighten (contract) and become firm. This helps to stop bleeding in your uterus. To help your uterus contract and to control bleeding, your health care provider may: ? Give you medicine by injection, through an IV tube, by mouth, or through your rectum (rectally). ? Massage your abdomen or perform a vaginal exam to remove any blood clots that are left in your uterus. ? Empty your bladder by placing a thin, flexible tube (catheter) into your bladder. ? Encourage   you to breastfeed your baby. After labor is over, you and your baby will be monitored closely to ensure that you are both healthy until you are ready to go home. Your health care team will teach you how to care for yourself and your baby. This information is not intended to replace advice given to you by your health care provider. Make sure you discuss any questions you have with your health care provider. Document Released: 10/10/2007 Document Revised: 07/21/2015 Document Reviewed: 01/15/2015 Elsevier Interactive Patient Education  2018 [employer]  

## 2017-05-02 ENCOUNTER — Encounter (HOSPITAL_COMMUNITY): Payer: Self-pay

## 2017-05-02 ENCOUNTER — Ambulatory Visit (HOSPITAL_COMMUNITY)
Admission: RE | Admit: 2017-05-02 | Discharge: 2017-05-02 | Disposition: A | Discharge status: Home / Self Care | Payer: Medicaid Other | Source: Ambulatory Visit | Attending: Obstetrics & Gynecology | Admitting: Obstetrics & Gynecology

## 2017-05-02 DIAGNOSIS — O99513 Diseases of the respiratory system complicating pregnancy, third trimester: Secondary | ICD-10-CM | POA: Insufficient documentation

## 2017-05-02 DIAGNOSIS — O24410 Gestational diabetes mellitus in pregnancy, diet controlled: Secondary | ICD-10-CM | POA: Insufficient documentation

## 2017-05-02 DIAGNOSIS — J45909 Unspecified asthma, uncomplicated: Secondary | ICD-10-CM | POA: Diagnosis not present

## 2017-05-02 DIAGNOSIS — Z3A38 38 weeks gestation of pregnancy: Secondary | ICD-10-CM | POA: Diagnosis not present

## 2017-05-02 DIAGNOSIS — O99213 Obesity complicating pregnancy, third trimester: Secondary | ICD-10-CM | POA: Diagnosis present

## 2017-05-02 DIAGNOSIS — Z6791 Unspecified blood type, Rh negative: Secondary | ICD-10-CM | POA: Diagnosis not present

## 2017-05-02 DIAGNOSIS — O99333 Smoking (tobacco) complicating pregnancy, third trimester: Secondary | ICD-10-CM | POA: Diagnosis not present

## 2017-05-02 DIAGNOSIS — O409XX0 Polyhydramnios, unspecified trimester, not applicable or unspecified: Secondary | ICD-10-CM

## 2017-05-02 DIAGNOSIS — O26893 Other specified pregnancy related conditions, third trimester: Secondary | ICD-10-CM | POA: Diagnosis not present

## 2017-05-02 DIAGNOSIS — O403XX0 Polyhydramnios, third trimester, not applicable or unspecified: Secondary | ICD-10-CM | POA: Diagnosis not present

## 2017-05-06 ENCOUNTER — Ambulatory Visit (INDEPENDENT_AMBULATORY_CARE_PROVIDER_SITE_OTHER): Payer: Medicaid Other | Admitting: Obstetrics and Gynecology

## 2017-05-06 VITALS — BP 128/75 | HR 64 | Wt 244.6 lb

## 2017-05-06 DIAGNOSIS — O26893 Other specified pregnancy related conditions, third trimester: Secondary | ICD-10-CM

## 2017-05-06 DIAGNOSIS — Z6791 Unspecified blood type, Rh negative: Secondary | ICD-10-CM

## 2017-05-06 DIAGNOSIS — O409XX0 Polyhydramnios, unspecified trimester, not applicable or unspecified: Secondary | ICD-10-CM

## 2017-05-06 DIAGNOSIS — O24419 Gestational diabetes mellitus in pregnancy, unspecified control: Secondary | ICD-10-CM

## 2017-05-06 DIAGNOSIS — Z720 Tobacco use: Secondary | ICD-10-CM

## 2017-05-06 DIAGNOSIS — O0993 Supervision of high risk pregnancy, unspecified, third trimester: Secondary | ICD-10-CM

## 2017-05-06 NOTE — Patient Instructions (Signed)
Induction of labor tomorrow at 7:30a. Please arrive at hospital in [facility] entrance.

## 2017-05-06 NOTE — Progress Notes (Signed)
Subjective:  Mary Davies is a 27 y.o. G2P0010 at 3998w1d being seen today for ongoing prenatal care.  She is currently monitored for the following issues for this high-risk pregnancy and has Tobacco abuse; Supervision of high-risk pregnancy; Asthma; Rh negative status during pregnancy; Gestational diabetes mellitus (GDM) affecting pregnancy; Polyhydramnios affecting pregnancy; GDM (gestational diabetes mellitus), class A1; and [redacted] weeks gestation of pregnancy on their problem list.  Patient reports no complaints. Patient told she was suppose to be induced at 39 weeks.  Contractions: Irritability. Vag. Bleeding: None.  Movement: Present. Denies leaking of fluid.   The following portions of the patient's history were reviewed and updated as appropriate: allergies, current medications, past family history, past medical history, past social history, past surgical history and problem list. Problem list updated.  Objective:   Vitals:   05/06/17 0950  BP: 128/75  Pulse: 64  Weight: 110.9 kg (244 lb 9.6 oz)    Fetal Status: Fetal Heart Rate (bpm): 131   Movement: Present     General:  Alert, oriented and cooperative. Patient is in no acute distress.  Skin: Skin is warm and dry. No rash noted.   Cardiovascular: Normal heart rate noted  Respiratory: Normal respiratory effort, no problems with respiration noted  Abdomen: Soft, gravid, appropriate for gestational age. Pain/Pressure: Present     Pelvic: Vag. Bleeding: None     Cervical exam deferred        Extremities: Normal range of motion.  Edema: Trace  Mental Status: Normal mood and affect. Normal behavior. Normal judgment and thought content.   Urinalysis:      Assessment and Plan:  Pregnancy: G2P0010 at 5198w1d  1. Supervision of high risk pregnancy in third trimester Doing well. IOL scheduled for tomorrow.   2. Gestational diabetes mellitus (GDM) affecting pregnancy Diet controlled. CBG log over the past week reviewed. Has 20% of her  values abnormal during postprandial checks; all fasting CBGs normal.    3. Rh negative status during pregnancy in third trimester Received 28wk Rhogam. Evaluate for Rhogam need postpartum.   4. Polyhydramnios affecting pregnancy Last US and BPOP on 4/19. AFI at that time 25cm. IOL recommended for 39 weeks.   5. Tobacco abuse Continues to smoke about 3-4 cigs a day.   Term labor symptoms and general obstetric precautions including but not limited to vaginal bleeding, contractions, leaking of fluid and fetal movement were reviewed in detail with the patient. Please refer to After Visit Summary for other counseling recommendations.  No follow-ups on file.   Pincus LargePhelps, Selso Mannor Y, DO

## 2017-05-07 ENCOUNTER — Encounter (HOSPITAL_COMMUNITY): Payer: Self-pay

## 2017-05-07 ENCOUNTER — Other Ambulatory Visit: Payer: Self-pay

## 2017-05-07 ENCOUNTER — Inpatient Hospital Stay (HOSPITAL_COMMUNITY)
Admission: RE | Admit: 2017-05-07 | Discharge: 2017-05-11 | DRG: 787 | Disposition: A | Discharge status: Home / Self Care | Payer: Medicaid Other | Source: Ambulatory Visit | Attending: Obstetrics and Gynecology | Admitting: Obstetrics and Gynecology

## 2017-05-07 VITALS — BP 126/84 | HR 84 | Temp 97.9°F | Resp 18 | Ht 62.5 in | Wt 247.0 lb

## 2017-05-07 DIAGNOSIS — O24429 Gestational diabetes mellitus in childbirth, unspecified control: Secondary | ICD-10-CM | POA: Diagnosis not present

## 2017-05-07 DIAGNOSIS — O409XX0 Polyhydramnios, unspecified trimester, not applicable or unspecified: Secondary | ICD-10-CM | POA: Diagnosis present

## 2017-05-07 DIAGNOSIS — D62 Acute posthemorrhagic anemia: Secondary | ICD-10-CM | POA: Diagnosis not present

## 2017-05-07 DIAGNOSIS — O99213 Obesity complicating pregnancy, third trimester: Secondary | ICD-10-CM | POA: Diagnosis present

## 2017-05-07 DIAGNOSIS — O26893 Other specified pregnancy related conditions, third trimester: Secondary | ICD-10-CM | POA: Diagnosis present

## 2017-05-07 DIAGNOSIS — O9952 Diseases of the respiratory system complicating childbirth: Secondary | ICD-10-CM | POA: Diagnosis present

## 2017-05-07 DIAGNOSIS — O403XX0 Polyhydramnios, third trimester, not applicable or unspecified: Secondary | ICD-10-CM | POA: Diagnosis present

## 2017-05-07 DIAGNOSIS — F17210 Nicotine dependence, cigarettes, uncomplicated: Secondary | ICD-10-CM | POA: Diagnosis present

## 2017-05-07 DIAGNOSIS — J45909 Unspecified asthma, uncomplicated: Secondary | ICD-10-CM | POA: Diagnosis present

## 2017-05-07 DIAGNOSIS — Z98891 History of uterine scar from previous surgery: Secondary | ICD-10-CM

## 2017-05-07 DIAGNOSIS — Z3A39 39 weeks gestation of pregnancy: Secondary | ICD-10-CM

## 2017-05-07 DIAGNOSIS — Z3490 Encounter for supervision of normal pregnancy, unspecified, unspecified trimester: Secondary | ICD-10-CM | POA: Diagnosis not present

## 2017-05-07 DIAGNOSIS — O34219 Maternal care for unspecified type scar from previous cesarean delivery: Secondary | ICD-10-CM

## 2017-05-07 DIAGNOSIS — O99334 Smoking (tobacco) complicating childbirth: Secondary | ICD-10-CM | POA: Diagnosis present

## 2017-05-07 DIAGNOSIS — O24410 Gestational diabetes mellitus in pregnancy, diet controlled: Secondary | ICD-10-CM | POA: Diagnosis present

## 2017-05-07 DIAGNOSIS — O9081 Anemia of the puerperium: Secondary | ICD-10-CM | POA: Diagnosis not present

## 2017-05-07 DIAGNOSIS — O26899 Other specified pregnancy related conditions, unspecified trimester: Secondary | ICD-10-CM

## 2017-05-07 DIAGNOSIS — O0990 Supervision of high risk pregnancy, unspecified, unspecified trimester: Secondary | ICD-10-CM

## 2017-05-07 DIAGNOSIS — O24419 Gestational diabetes mellitus in pregnancy, unspecified control: Secondary | ICD-10-CM | POA: Diagnosis present

## 2017-05-07 DIAGNOSIS — Z6791 Unspecified blood type, Rh negative: Secondary | ICD-10-CM | POA: Diagnosis not present

## 2017-05-07 HISTORY — DX: Unspecified fracture of left forearm, initial encounter for closed fracture: S52.92XA

## 2017-05-07 LAB — GLUCOSE, CAPILLARY
GLUCOSE-CAPILLARY: 97 mg/dL (ref 65–99)
GLUCOSE-CAPILLARY: 92 mg/dL (ref 65–99)
GLUCOSE-CAPILLARY: 102 mg/dL — AB (ref 65–99)

## 2017-05-07 LAB — CBC
HEMATOCRIT: 38.9 % (ref 36.0–46.0)
HEMOGLOBIN: 13.3 g/dL (ref 12.0–15.0)
MCH: 33.3 pg (ref 26.0–34.0)
MCHC: 34.2 g/dL (ref 30.0–36.0)
MCV: 97.5 fL (ref 78.0–100.0)
Platelets: 224 10*3/uL (ref 150–400)
RBC: 3.99 MIL/uL (ref 3.87–5.11)
RDW: 15.3 % (ref 11.5–15.5)
WBC: 8.2 10*3/uL (ref 4.0–10.5)

## 2017-05-07 MED ORDER — LACTATED RINGERS IV SOLN
INTRAVENOUS | Status: DC
  Administered 2017-05-07 – 2017-05-08 (×5): via INTRAVENOUS

## 2017-05-07 MED ORDER — OXYTOCIN BOLUS FROM INFUSION: 500.0000 mL | Freq: Once | INTRAVENOUS | Status: DC

## 2017-05-07 MED ORDER — LACTATED RINGERS IV SOLN
500.0000 mL | INTRAVENOUS | Status: DC | PRN
  Administered 2017-05-08 (×2): 500 mL via INTRAVENOUS
  Administered 2017-05-08: 1000 mL via INTRAVENOUS

## 2017-05-07 MED ORDER — TERBUTALINE SULFATE 1 MG/ML IJ SOLN: 0.2500 mg | Freq: Once | INTRAMUSCULAR | Status: DC | PRN

## 2017-05-07 MED ORDER — ONDANSETRON HCL 4 MG/2ML IJ SOLN: 4.0000 mg | Freq: Four times a day (QID) | INTRAMUSCULAR | Status: DC | PRN

## 2017-05-07 MED ORDER — OXYCODONE-ACETAMINOPHEN 5-325 MG PO TABS: 1.0000 | ORAL_TABLET | ORAL | Status: DC | PRN

## 2017-05-07 MED ORDER — SOD CITRATE-CITRIC ACID 500-334 MG/5ML PO SOLN
30.0000 mL | ORAL | Status: DC | PRN
  Administered 2017-05-08: 30 mL via ORAL
  Filled 2017-05-07: qty 15

## 2017-05-07 MED ORDER — OXYTOCIN 40 UNITS IN LACTATED RINGERS INFUSION - SIMPLE MED: 2.5000 [IU]/h | INTRAVENOUS | Status: DC

## 2017-05-07 MED ORDER — FENTANYL CITRATE (PF) 100 MCG/2ML IJ SOLN
100.0000 ug | INTRAMUSCULAR | Status: DC | PRN
  Administered 2017-05-07 (×3): 100 ug via INTRAVENOUS
  Filled 2017-05-07 (×3): qty 2

## 2017-05-07 MED ORDER — LIDOCAINE HCL (PF) 1 % IJ SOLN: 30.0000 mL | INTRAMUSCULAR | Status: DC | PRN

## 2017-05-07 MED ORDER — MISOPROSTOL 50MCG HALF TABLET
50.0000 ug | ORAL_TABLET | ORAL | Status: DC | PRN
  Administered 2017-05-07 – 2017-05-08 (×4): 50 ug via BUCCAL
  Filled 2017-05-07 (×4): qty 1

## 2017-05-07 MED ORDER — ACETAMINOPHEN 325 MG PO TABS: 650.0000 mg | ORAL_TABLET | ORAL | Status: DC | PRN

## 2017-05-07 MED ORDER — OXYCODONE-ACETAMINOPHEN 5-325 MG PO TABS: 2.0000 | ORAL_TABLET | ORAL | Status: DC | PRN

## 2017-05-07 MED ORDER — PROMETHAZINE HCL 25 MG/ML IJ SOLN
12.5000 mg | Freq: Four times a day (QID) | INTRAMUSCULAR | Status: DC | PRN
  Administered 2017-05-07: 12.5 mg via INTRAVENOUS
  Filled 2017-05-07: qty 1

## 2017-05-07 MED ORDER — BUTORPHANOL TARTRATE 1 MG/ML IJ SOLN
1.0000 mg | INTRAMUSCULAR | Status: DC | PRN
  Administered 2017-05-07 (×4): 1 mg via INTRAVENOUS
  Filled 2017-05-07 (×4): qty 1

## 2017-05-07 NOTE — H&P (Addendum)
LABOR AND DELIVERY ADMISSION HISTORY AND PHYSICAL NOTE  Mary Davies is a 27 y.o. female G2P0010 with IUP at 41w2dby first trimester ultrasound presenting for induction of labor secondary to polyhydramnios and A1GDM.   She reports positive fetal movement. She denies leakage of fluid or vaginal bleeding. No regular contractions at this time either.  Prenatal History/Complications[facility], RH neg, tobacco use, polyhydramnios  Sono:  381w4dBPP 8/8 AFI 2506CBcephalic 3751w4dWD, normal anatomy, AFI 30cm, BPP 8/8, EFW 14%ile  Past Medical History: Past Medical History:  Diagnosis Date  . Asthma    prn inhaler  . Closed left radial fracture 01/15/2011  . Depression    hx of, fine now  . Fracture of metatarsal bone of left foot 04/22/2015   5th metatarsal  . Left breast abscess 04/2015    Past Surgical History: Past Surgical History:  Procedure Laterality Date  . INCISION AND DRAINAGE ABSCESS Left 09/24/2012   Procedure: INCISION AND DRAINAGE BREAST ABSCESS;  Surgeon: PauMerrie RoofD;  [facility];  Laterality: Left;  . INCISION AND DRAINAGE ABSCESS Left 05/08/2015   Procedure: INCISION AND DRAINAGE LEFT BREAST  ABSCESS;  Surgeon: PauAutumn MessingI, MD;  [facility];  Laterality: Left;    Obstetrical History: OB History    Gravida  2   Para      Term      Preterm      AB  1   Living  0     SAB  1   TAB      Ectopic      Multiple      Live Births              Social History: Social History   Socioeconomic History  . Marital status: Single    Spouse name: Not on file  . Number of children: Not on file  . Years of education: Not on file  . Highest education level: Not on file  Occupational History  . Not on file  Social Needs  . Financial resource strain: Not on file  . Food insecurity:    Worry: Not on file    Inability: Not on file  .  Transportation needs:    Medical: Not on file    Non-medical: Not on file  Tobacco Use  . Smoking status: Current Every Day Smoker    Packs/day: 0.25    Years: 10.00    Pack years: 2.50    Types: Cigarettes  . Smokeless tobacco: Never Used  . Tobacco comment: trying to quit  Substance and Sexual Activity  . Alcohol use: No  . Drug use: No  . Sexual activity: Yes    Birth control/protection: None  Lifestyle  . Physical activity:    Days per week: Not on file    Minutes per session: Not on file  . Stress: Not on file  Relationships  . Social connections:    Talks on phone: Not on file    Gets together: Not on file    Attends religious service: Not on file    Active member of club or organization: Not on file    Attends meetings of clubs or organizations: Not on file    Relationship status: Not on file  Other Topics Concern  . Not on file  Social History Narrative  . Not on file    Family History: Family  History  Problem Relation Age of Onset  . Diabetes Mother   . Asthma Father     Allergies: Allergies  Allergen Reactions  . Pineapple Shortness Of Breath, Itching and Swelling    Medications Prior to Admission  Medication Sig Dispense Refill Last Dose  . albuterol (PROVENTIL HFA;VENTOLIN HFA) 108 (90 Base) MCG/ACT inhaler Inhale into the lungs every 6 (six) hours as needed for wheezing or shortness of breath.   Past Week at Unknown time  . Prenatal Multivit-Min-Fe-FA (PRENATAL VITAMINS) 0.8 MG tablet Take 1 tablet by mouth daily. 30 tablet 12 05/06/2017 at Unknown time  . ACCU-CHEK FASTCLIX LANCETS MISC 1 Units by Percutaneous route 4 (four) times daily. 100 each 12 Taking  . Blood Glucose Monitoring Suppl (ACCU-CHEK NANO SMARTVIEW) w/Device KIT 1 kit by Subdermal route as directed. Check blood sugars for fasting, and two hours after breakfast, lunch and dinner (4 checks daily) 1 kit 0 Taking  . glucose blood (ACCU-CHEK SMARTVIEW) test strip Use as instructed to  check blood sugars 100 each 12 Taking     Review of Systems   All systems reviewed and negative except as stated in HPI  Blood pressure 121/77, pulse 74, temperature 98.6 F (37 C), temperature source Oral, resp. rate 18, height 5' 2.5" (1.588 m), weight 247 lb (112 kg), last menstrual period 07/07/2016. General appearance: alert, cooperative and no distress Lungs: clear to auscultation bilaterally Heart: regular rate and rhythm Abdomen: soft, non-tender; bowel sounds normal Extremities: No calf swelling or tenderness Presentation: cephalic Fetal monitoring: 135, +accels, -decels, mod variablity Uterine activity: irritability Dilation: Fingertip Effacement (%): Thick Station: Ballotable Exam by:: Ignacia Felling, RN   Prenatal labs: ABO, Rh: --/--/O NEG (04/24 0740) Antibody: POS (04/24 0740) Rubella: 4.79 (10/10 1111) RPR: Non Reactive (02/04 1416)  HBsAg: Negative (10/10 1111)  HIV: Non Reactive (02/04 1416)  GBS:   Negative 2 hr Glucola: abnormal, Diagnosed with GDM Genetic screening:  Declined Anatomy US: normal  Prenatal Transfer Tool  Maternal Diabetes: Yes:  Diabetes Type:  Diet controlled Genetic Screening: Declined Maternal Ultrasounds/Referrals: Abnormal:  Findings:   Other: Polyhydramnios Fetal Ultrasounds or other Referrals:  Referred to Materal Fetal Medicine  Maternal Substance Abuse:  Yes:  Type: Smoker Significant Maternal Medications:  None Significant Maternal Lab Results: Lab values include: Group B Strep negative, Rh negative  Results for orders placed or performed during the hospital encounter of 05/07/17 (from the past 24 hour(s))  CBC   Collection Time: 05/07/17  7:40 AM  Result Value Ref Range   WBC 8.2 4.0 - 10.5 K/uL   RBC 3.99 3.87 - 5.11 MIL/uL   Hemoglobin 13.3 12.0 - 15.0 g/dL   HCT 38.9 36.0 - 46.0 %   MCV 97.5 78.0 - 100.0 fL   MCH 33.3 26.0 - 34.0 pg   MCHC 34.2 30.0 - 36.0 g/dL   RDW 15.3 11.5 - 15.5 %   Platelets 224 150 - 400  K/uL  Type and screen   Collection Time: 05/07/17  7:40 AM  Result Value Ref Range   ABO/RH(D) O NEG    Antibody Screen POS    Sample Expiration 05/10/2017    Antibody Identification      PASSIVELY ACQUIRED ANTI-D Performed at [facility], [address], [place], [place] [address]     Patient Active Problem List   Diagnosis Date Noted  . Encounter for induction of labor 05/07/2017  . GDM (gestational diabetes mellitus), class A1   . [redacted] weeks gestation  of pregnancy   . Polyhydramnios affecting pregnancy 04/28/2017  . Gestational diabetes mellitus (GDM) affecting pregnancy 12/30/2016  . Asthma 10/30/2016  . Rh negative status during pregnancy 10/30/2016  . Supervision of high-risk pregnancy 10/23/2016  . Tobacco abuse 07/14/2012    Assessment: Mary Davies is a 27 y.o. G2P0010 at 59w2dhere for  induction of labor secondary to polyhydramnios and A1GDM.   #Labor: IOL with cervical ripening given low bishop score. Cevical ripening with cytotec. Place FB when able.  #Pain: Epidural when appropriate #FWB: Cat I; reassuring at this time #ID:  GBS negative; no abx #MOF: Breast #MOC:IUD #Circ:  N/a; girl  #A1 GDM: Will perform q4hr glucose checks intrapartum. Will need 2hr GTT 6 weeks postpartum.   CLoann Quill MD PGY-3 05/07/2017, 10:49 AM  OB FELLOW HISTORY AND PHYSICAL ATTESTATION  I confirm that I have verified the information documented in the resident's note and that I have also personally reperformed the physical exam and all medical decision making activities. I agree with above documentation and have made edits as needed.   JLuiz Blare DO OB Fellow 05/07/2017, 12:37 PM

## 2017-05-07 NOTE — Anesthesia Pain Management Evaluation Note (Signed)
  [facility] Pain Management Visit Note  Patient: Mary Davies, 27 y.o., female  "Hello [facility] am a [facility]. We have an anesthesia team available at all times to provide care throughout the hospital, including epidural management and anesthesia for C-section. I don't know your plan for the delivery whether it a natural birth, water birth, IV sedation, nitrous supplementation, doula or epidural, but we want to meet your pain goals."   1.Was your pain managed to your expectations on prior hospitalizations?   No prior hospitalizations  2.What is your expectation for pain management during this hospitalization?     Epidural  3.How can we help you reach that goal? epidural  Record the patient's initial score and the patient's pain goal.   Pain: 1  Pain Goal: 5 [facility] wants you to be able to say your pain was always managed very well.  [facility]en[facility] Memorial Hospital IncMARSHALL,Lorry Anastasi 05/07/2017

## 2017-05-07 NOTE — Progress Notes (Signed)
Subjective: Doing well with no issues. Pain from FB has improved, although cramping starting to increase again.   Objective: BP 128/80   Pulse 68   Temp 98.3 F (36.8 C) (Oral)   Resp 20   Ht 5' 2.5" (1.588 m)   Wt 112 kg (247 lb)   LMP 07/07/2016 (Approximate)   BMI 44.46 kg/m  No intake/output data recorded. No intake/output data recorded.  FHT:  FHR: 130 bpm, variability: moderate,  accelerations:  Abscent,  decelerations:  Absent UC:   regular, every 4-5 minutes SVE:   Dilation: Fingertip Effacement (%): Thick Station: -2 Exam by:: Dr. Orvan Falconerampbell, Dr. Doroteo GlassmanPhelps  Labs: Lab Results  Component Value Date   WBC 8.2 05/07/2017   HGB 13.3 05/07/2017   HCT 38.9 05/07/2017   MCV 97.5 05/07/2017   PLT 224 05/07/2017    Assessment / Plan: Induction of labor due to polyhydramnios, A1GDM,  progressing well on pitocin  Labor: FB in place, likely pit after FB is out Preeclampsia:  N/A Fetal Wellbeing:  Category I Pain Control:  IV pain meds I/D:  n/a Anticipated MOD:  NSVD  Mary Davies 05/07/2017, 10:53 PM

## 2017-05-07 NOTE — Progress Notes (Signed)
S: Patient seen & examined for progress of labor.Patient continues to have foley bulb in place. Worsening discomfort. Received dose of IV Fentanyl for pain relief.   O:  Vitals:   05/07/17 0746 05/07/17 1225  BP: 121/77 134/79  Pulse: 74 67  Resp: 18 16  Temp: 98.6 F (37 C) 98.3 F (36.8 C)  TempSrc: Oral Oral  Weight: 247 lb (112 kg)   Height: 5' 2.5" (1.588 m)     Dilation: Fingertip Effacement (%): Thick Station: -2 Presentation: Vertex(verified by US also) Exam by:: Dr. Orvan Falconerampbell, Dr. Doroteo GlassmanPhelps   FHT: 135 bpm, mod var, +accels, no decels; difficult to trace TOCO: difficult to trace. Patient subjectively reporting regular contractions about every 5min. Irregular uterine irritability on the monitor.    A/P: This is a 27 y.o. female G2P0010 with IUP at 4134w2d by first trimester ultrasound presenting for induction of labor secondary to polyhydramnios and A1GDM  Initial blood glucose of 97 Diet controlled at baseline. No indication for glucose stabilizer at this time Continue with q4hr glucose checks intrapartum Received Fentanyl for pain relief Foley bulb remains in place. Received cytotec x 2 with last dose around 1233. Next dose around 1630.   Continue expectant management otherwise at this time Anticipate SVD   Gorden HarmsMegan Bolden Hagerman, MD PGY-3 05/07/2017 3:52 PM

## 2017-05-08 ENCOUNTER — Inpatient Hospital Stay (HOSPITAL_COMMUNITY): Payer: Medicaid Other | Admitting: Anesthesiology

## 2017-05-08 ENCOUNTER — Encounter (HOSPITAL_COMMUNITY)
Admission: RE | Disposition: A | Discharge status: Home / Self Care | Payer: Self-pay | Source: Ambulatory Visit | Attending: Obstetrics and Gynecology

## 2017-05-08 ENCOUNTER — Encounter (HOSPITAL_COMMUNITY): Payer: Self-pay

## 2017-05-08 DIAGNOSIS — Z3A39 39 weeks gestation of pregnancy: Secondary | ICD-10-CM

## 2017-05-08 DIAGNOSIS — O403XX0 Polyhydramnios, third trimester, not applicable or unspecified: Secondary | ICD-10-CM

## 2017-05-08 DIAGNOSIS — O24429 Gestational diabetes mellitus in childbirth, unspecified control: Secondary | ICD-10-CM

## 2017-05-08 LAB — GLUCOSE, CAPILLARY
GLUCOSE-CAPILLARY: 80 mg/dL (ref 65–99)
Glucose-Capillary: 72 mg/dL (ref 65–99)
Glucose-Capillary: 78 mg/dL (ref 65–99)

## 2017-05-08 LAB — SYPHILIS: RPR W/REFLEX TO RPR TITER AND TREPONEMAL ANTIBODIES, TRADITIONAL SCREENING AND DIAGNOSIS ALGORITHM: RPR Ser Ql: NONREACTIVE

## 2017-05-08 SURGERY — Surgical Case
Anesthesia: Epidural | Site: Abdomen | Wound class: Clean Contaminated

## 2017-05-08 MED ORDER — ACETAMINOPHEN 325 MG PO TABS
650.0000 mg | ORAL_TABLET | ORAL | Status: DC | PRN
  Administered 2017-05-10 – 2017-05-11 (×2): 650 mg via ORAL
  Filled 2017-05-08 (×2): qty 2

## 2017-05-08 MED ORDER — SIMETHICONE 80 MG PO CHEW
80.0000 mg | CHEWABLE_TABLET | ORAL | Status: DC | PRN
Start: 2017-05-08 — End: 2017-05-11

## 2017-05-08 MED ORDER — LACTATED RINGERS IV SOLN
INTRAVENOUS | Status: DC | PRN
  Administered 2017-05-08: 16:00:00 via INTRAVENOUS

## 2017-05-08 MED ORDER — DEXAMETHASONE SODIUM PHOSPHATE 4 MG/ML IJ SOLN
INTRAMUSCULAR | Status: DC | PRN
  Administered 2017-05-08: 4 mg via INTRAVENOUS

## 2017-05-08 MED ORDER — OXYTOCIN 40 UNITS IN LACTATED RINGERS INFUSION - SIMPLE MED: 2.5000 [IU]/h | INTRAVENOUS | Status: AC

## 2017-05-08 MED ORDER — EPHEDRINE 5 MG/ML INJ: 10.0000 mg | INTRAVENOUS | Status: DC | PRN

## 2017-05-08 MED ORDER — ONDANSETRON HCL 4 MG/2ML IJ SOLN: 4.0000 mg | Freq: Three times a day (TID) | INTRAMUSCULAR | Status: DC | PRN

## 2017-05-08 MED ORDER — IBUPROFEN 600 MG PO TABS
600.0000 mg | ORAL_TABLET | Freq: Four times a day (QID) | ORAL | Status: DC
  Administered 2017-05-09 – 2017-05-11 (×10): 600 mg via ORAL
  Filled 2017-05-08 (×10): qty 1

## 2017-05-08 MED ORDER — KETOROLAC TROMETHAMINE 30 MG/ML IJ SOLN
INTRAMUSCULAR | Status: AC
  Filled 2017-05-08: qty 1

## 2017-05-08 MED ORDER — OXYTOCIN 10 UNIT/ML IJ SOLN
INTRAVENOUS | Status: DC | PRN
  Administered 2017-05-08: 40 [IU] via INTRAVENOUS

## 2017-05-08 MED ORDER — MORPHINE SULFATE (PF) 0.5 MG/ML IJ SOLN
INTRAMUSCULAR | Status: DC | PRN
  Administered 2017-05-08: 4 mg via EPIDURAL
  Administered 2017-05-08: 1 mg via INTRAVENOUS

## 2017-05-08 MED ORDER — SIMETHICONE 80 MG PO CHEW
80.0000 mg | CHEWABLE_TABLET | ORAL | Status: DC
  Administered 2017-05-08 – 2017-05-11 (×3): 80 mg via ORAL
  Filled 2017-05-08 (×3): qty 1

## 2017-05-08 MED ORDER — PHENYLEPHRINE 40 MCG/ML (10ML) SYRINGE FOR IV PUSH (FOR BLOOD PRESSURE SUPPORT): 80.0000 ug | PREFILLED_SYRINGE | INTRAVENOUS | Status: DC | PRN

## 2017-05-08 MED ORDER — ONDANSETRON HCL 4 MG/2ML IJ SOLN
INTRAMUSCULAR | Status: AC
  Filled 2017-05-08: qty 2

## 2017-05-08 MED ORDER — SODIUM BICARBONATE 8.4 % IV SOLN
INTRAVENOUS | Status: AC
  Filled 2017-05-08: qty 50

## 2017-05-08 MED ORDER — MENTHOL 3 MG MT LOZG: 1.0000 | LOZENGE | OROMUCOSAL | Status: DC | PRN

## 2017-05-08 MED ORDER — NALBUPHINE HCL 10 MG/ML IJ SOLN: 5.0000 mg | INTRAMUSCULAR | Status: DC | PRN

## 2017-05-08 MED ORDER — ALBUTEROL SULFATE (2.5 MG/3ML) 0.083% IN NEBU: 3.0000 mL | INHALATION_SOLUTION | Freq: Four times a day (QID) | RESPIRATORY_TRACT | Status: DC | PRN

## 2017-05-08 MED ORDER — ACETAMINOPHEN 500 MG PO TABS
1000.0000 mg | ORAL_TABLET | Freq: Four times a day (QID) | ORAL | Status: AC
  Administered 2017-05-08 – 2017-05-09 (×3): 1000 mg via ORAL
  Filled 2017-05-08 (×3): qty 2

## 2017-05-08 MED ORDER — TETANUS-DIPHTH-ACELL PERTUSSIS 5-2.5-18.5 LF-MCG/0.5 IM SUSP: 0.5000 mL | Freq: Once | INTRAMUSCULAR | Status: DC

## 2017-05-08 MED ORDER — PRENATAL MULTIVITAMIN CH
1.0000 | ORAL_TABLET | Freq: Every day | ORAL | Status: DC
  Administered 2017-05-09 – 2017-05-10 (×2): 1 via ORAL
  Filled 2017-05-08 (×2): qty 1

## 2017-05-08 MED ORDER — FENTANYL 2.5 MCG/ML BUPIVACAINE 1/10 % EPIDURAL INFUSION (WH - ANES)
14.0000 mL/h | INTRAMUSCULAR | Status: DC | PRN
  Administered 2017-05-08 (×2): 14 mL/h via EPIDURAL
  Filled 2017-05-08 (×2): qty 100

## 2017-05-08 MED ORDER — DIPHENHYDRAMINE HCL 50 MG/ML IJ SOLN: 12.5000 mg | INTRAMUSCULAR | Status: DC | PRN

## 2017-05-08 MED ORDER — SODIUM BICARBONATE 8.4 % IV SOLN
INTRAVENOUS | Status: DC | PRN
  Administered 2017-05-08 (×3): 5 mL via EPIDURAL

## 2017-05-08 MED ORDER — ENOXAPARIN SODIUM 60 MG/0.6ML ~~LOC~~ SOLN
60.0000 mg | SUBCUTANEOUS | Status: DC
  Administered 2017-05-09 – 2017-05-10 (×2): 60 mg via SUBCUTANEOUS
  Filled 2017-05-08 (×3): qty 0.6

## 2017-05-08 MED ORDER — DIPHENHYDRAMINE HCL 25 MG PO CAPS: 25.0000 mg | ORAL_CAPSULE | ORAL | Status: DC | PRN

## 2017-05-08 MED ORDER — LACTATED RINGERS IV SOLN: 500.0000 mL | Freq: Once | INTRAVENOUS | Status: DC

## 2017-05-08 MED ORDER — LACTATED RINGERS IV SOLN
INTRAVENOUS | Status: DC
  Administered 2017-05-09: 05:00:00 via INTRAVENOUS

## 2017-05-08 MED ORDER — SIMETHICONE 80 MG PO CHEW
80.0000 mg | CHEWABLE_TABLET | Freq: Three times a day (TID) | ORAL | Status: DC
  Administered 2017-05-09 – 2017-05-11 (×5): 80 mg via ORAL
  Filled 2017-05-08 (×6): qty 1

## 2017-05-08 MED ORDER — NALBUPHINE HCL 10 MG/ML IJ SOLN: 5.0000 mg | Freq: Once | INTRAMUSCULAR | Status: DC | PRN

## 2017-05-08 MED ORDER — COCONUT OIL OIL
1.0000 | TOPICAL_OIL | Status: DC | PRN
Start: 2017-05-08 — End: 2017-05-11

## 2017-05-08 MED ORDER — MORPHINE SULFATE (PF) 0.5 MG/ML IJ SOLN
INTRAMUSCULAR | Status: AC
  Filled 2017-05-08: qty 10

## 2017-05-08 MED ORDER — KETOROLAC TROMETHAMINE 30 MG/ML IJ SOLN
30.0000 mg | Freq: Four times a day (QID) | INTRAMUSCULAR | Status: AC | PRN
  Administered 2017-05-08: 30 mg via INTRAMUSCULAR

## 2017-05-08 MED ORDER — LIDOCAINE HCL (PF) 1 % IJ SOLN
INTRAMUSCULAR | Status: DC | PRN
  Administered 2017-05-08: 6 mL
  Administered 2017-05-08: 4 mL

## 2017-05-08 MED ORDER — PHENYLEPHRINE 40 MCG/ML (10ML) SYRINGE FOR IV PUSH (FOR BLOOD PRESSURE SUPPORT)
80.0000 ug | PREFILLED_SYRINGE | INTRAVENOUS | Status: DC | PRN
  Filled 2017-05-08: qty 10

## 2017-05-08 MED ORDER — CEFAZOLIN SODIUM-DEXTROSE 2-3 GM-%(50ML) IV SOLR
INTRAVENOUS | Status: DC | PRN
  Administered 2017-05-08: 2 g via INTRAVENOUS

## 2017-05-08 MED ORDER — DIBUCAINE 1 % RE OINT: 1.0000 "application " | TOPICAL_OINTMENT | RECTAL | Status: DC | PRN

## 2017-05-08 MED ORDER — LACTATED RINGERS IV SOLN
INTRAVENOUS | Status: DC | PRN
  Administered 2017-05-08: 17:00:00 via INTRAVENOUS

## 2017-05-08 MED ORDER — DIPHENHYDRAMINE HCL 25 MG PO CAPS: 25.0000 mg | ORAL_CAPSULE | Freq: Four times a day (QID) | ORAL | Status: DC | PRN

## 2017-05-08 MED ORDER — SODIUM CHLORIDE 0.9% FLUSH: 3.0000 mL | INTRAVENOUS | Status: DC | PRN

## 2017-05-08 MED ORDER — LACTATED RINGERS IV SOLN
INTRAVENOUS | Status: DC
Start: 2017-05-08 — End: 2017-05-08
  Administered 2017-05-08 (×2): via INTRAUTERINE

## 2017-05-08 MED ORDER — OXYTOCIN 40 UNITS IN LACTATED RINGERS INFUSION - SIMPLE MED
1.0000 m[IU]/min | INTRAVENOUS | Status: DC
  Administered 2017-05-08: 2 m[IU]/min via INTRAVENOUS
  Filled 2017-05-08: qty 1000

## 2017-05-08 MED ORDER — NALOXONE HCL 0.4 MG/ML IJ SOLN
0.4000 mg | INTRAMUSCULAR | Status: DC | PRN
Start: 2017-05-08 — End: 2017-05-11

## 2017-05-08 MED ORDER — OXYTOCIN 10 UNIT/ML IJ SOLN
INTRAMUSCULAR | Status: AC
  Filled 2017-05-08: qty 4

## 2017-05-08 MED ORDER — NALOXONE HCL 4 MG/10ML IJ SOLN: 1.0000 ug/kg/h | INTRAVENOUS | Status: DC | PRN

## 2017-05-08 MED ORDER — ONDANSETRON HCL 4 MG/2ML IJ SOLN
INTRAMUSCULAR | Status: DC | PRN
  Administered 2017-05-08: 4 mg via INTRAVENOUS

## 2017-05-08 MED ORDER — ZOLPIDEM TARTRATE 5 MG PO TABS: 5.0000 mg | ORAL_TABLET | Freq: Every evening | ORAL | Status: DC | PRN

## 2017-05-08 MED ORDER — MEPERIDINE HCL 25 MG/ML IJ SOLN: 6.2500 mg | INTRAMUSCULAR | Status: DC | PRN

## 2017-05-08 MED ORDER — FENTANYL CITRATE (PF) 100 MCG/2ML IJ SOLN
25.0000 ug | INTRAMUSCULAR | Status: DC | PRN
  Administered 2017-05-08: 50 ug via INTRAVENOUS

## 2017-05-08 MED ORDER — CHLOROPROCAINE HCL (PF) 3 % IJ SOLN
INTRAMUSCULAR | Status: AC
  Filled 2017-05-08: qty 20

## 2017-05-08 MED ORDER — SENNOSIDES-DOCUSATE SODIUM 8.6-50 MG PO TABS
2.0000 | ORAL_TABLET | ORAL | Status: DC
  Administered 2017-05-08 – 2017-05-11 (×3): 2 via ORAL
  Filled 2017-05-08 (×3): qty 2

## 2017-05-08 MED ORDER — FENTANYL CITRATE (PF) 100 MCG/2ML IJ SOLN
INTRAMUSCULAR | Status: AC
  Filled 2017-05-08: qty 2

## 2017-05-08 MED ORDER — WITCH HAZEL-GLYCERIN EX PADS: 1.0000 "application " | MEDICATED_PAD | CUTANEOUS | Status: DC | PRN

## 2017-05-08 MED ORDER — KETOROLAC TROMETHAMINE 30 MG/ML IJ SOLN
30.0000 mg | Freq: Four times a day (QID) | INTRAMUSCULAR | Status: AC | PRN
  Administered 2017-05-09: 30 mg via INTRAVENOUS
  Filled 2017-05-08: qty 1

## 2017-05-08 MED ORDER — SCOPOLAMINE 1 MG/3DAYS TD PT72: 1.0000 | MEDICATED_PATCH | Freq: Once | TRANSDERMAL | Status: DC

## 2017-05-08 SURGICAL SUPPLY — 34 items
BENZOIN TINCTURE PRP APPL 2/3 (GAUZE/BANDAGES/DRESSINGS) ×3 IMPLANT
CHLORAPREP W/TINT 26ML (MISCELLANEOUS) ×3 IMPLANT
CLAMP CORD UMBIL (MISCELLANEOUS) IMPLANT
CLOSURE WOUND 1/2 X4 (GAUZE/BANDAGES/DRESSINGS) ×1
CLOTH BEACON ORANGE TIMEOUT ST (SAFETY) ×3 IMPLANT
DRSG OPSITE POSTOP 4X10 (GAUZE/BANDAGES/DRESSINGS) ×3 IMPLANT
ELECT REM PT RETURN 9FT ADLT (ELECTROSURGICAL) ×3
ELECTRODE REM PT RTRN 9FT ADLT (ELECTROSURGICAL) ×1 IMPLANT
EXTRACTOR VACUUM KIWI (MISCELLANEOUS) IMPLANT
GLOVE BIOGEL PI IND STRL 7.0 (GLOVE) ×1 IMPLANT
GLOVE BIOGEL PI IND STRL 9 (GLOVE) ×1 IMPLANT
GLOVE BIOGEL PI INDICATOR 7.0 (GLOVE) ×2
GLOVE BIOGEL PI INDICATOR 9 (GLOVE) ×2
GLOVE SS PI 9.0 STRL (GLOVE) ×3 IMPLANT
GOWN STRL REUS W/TWL 2XL LVL3 (GOWN DISPOSABLE) ×3 IMPLANT
GOWN STRL REUS W/TWL LRG LVL3 (GOWN DISPOSABLE) ×3 IMPLANT
NEEDLE HYPO 25X5/8 SAFETYGLIDE (NEEDLE) IMPLANT
NS IRRIG 1000ML POUR BTL (IV SOLUTION) ×3 IMPLANT
PACK C SECTION WH (CUSTOM PROCEDURE TRAY) ×3 IMPLANT
PAD OB MATERNITY 4.3X12.25 (PERSONAL CARE ITEMS) ×3 IMPLANT
PENCIL SMOKE EVAC W/HOLSTER (ELECTROSURGICAL) ×3 IMPLANT
RTRCTR C-SECT PINK 25CM LRG (MISCELLANEOUS) IMPLANT
RTRCTR C-SECT PINK 34CM XLRG (MISCELLANEOUS) ×3 IMPLANT
STRIP CLOSURE SKIN 1/2X4 (GAUZE/BANDAGES/DRESSINGS) ×2 IMPLANT
SUT MNCRL 0 VIOLET CTX 36 (SUTURE) ×2 IMPLANT
SUT MONOCRYL 0 CTX 36 (SUTURE) ×4
SUT VIC AB 0 CT1 27 (SUTURE) ×2
SUT VIC AB 0 CT1 27XBRD ANBCTR (SUTURE) ×1 IMPLANT
SUT VIC AB 2-0 CT1 27 (SUTURE) ×2
SUT VIC AB 2-0 CT1 TAPERPNT 27 (SUTURE) ×1 IMPLANT
SUT VIC AB 4-0 KS 27 (SUTURE) ×3 IMPLANT
SYR BULB IRRIGATION 50ML (SYRINGE) IMPLANT
TOWEL OR 17X24 6PK STRL BLUE (TOWEL DISPOSABLE) ×3 IMPLANT
TRAY FOLEY W/BAG SLVR 14FR LF (SET/KITS/TRAYS/PACK) ×3 IMPLANT

## 2017-05-08 NOTE — Progress Notes (Signed)
[name] is a 27 y.o. G2P0010 at 7632w3d  admitted for induction of labor due to Hydramnios and Gestational diabetes. Baby is AGA by last u/s Pt has been induced since yesterday, progressed to 5 cm , and has had repetitive variables with contractions that have precluded our efforts to continue induction.  Amnioinfusion has not been helpful in reducing the decels. Pt has made no progress in 6 hours , remains 5 cm/ 50%(-70) with a firm fibrotic cervix that has not softened. Pt is dissatisfied with the pace of progress and ready for solutions. She is not prepared for a further prolongation of the labor.  Subjective:   Objective: BP (!) 147/86   Pulse 74   Temp 98 F (36.7 C) (Axillary)   Resp 18   Ht 5' 2.5" (1.588 m)   Wt 247 lb (112 kg)   LMP 07/07/2016 (Approximate)   BMI 44.46 kg/m  No intake/output data recorded. Total I/O In: -  Out: 1525 [Urine:1525]  FHT:  FHR: 155 bpm, variability: moderate,  accelerations:  Present,  decelerations:  Present variables and prolonged decels UC:   regular, every 4 minutes SVE:   Dilation: 5 Effacement (%): 70 Station: -3 Exam by:: Dr Mary Davies   Labs: Lab Results  Component Value Date   WBC 8.2 05/07/2017   HGB 13.3 05/07/2017   HCT 38.9 05/07/2017   MCV 97.5 05/07/2017   PLT 224 05/07/2017    Assessment / Plan: fetal intolerance of labor in a prolonged latent phase  Labor: unable to continue pitocin adequately to progress Preeclampsia:   Fetal Wellbeing:  Category II Pain Control:  Epidural I/D:  n/a Anticipated MOD:  cesarean section, risk rationale and potential complications reviewed personally by me with pt.  Mary Davies 05/08/2017, 4:16 PM

## 2017-05-08 NOTE — Progress Notes (Signed)
LABOR PROGRESS NOTE  Mary Davies is a 27 y.o. G2P0010 at 2978w3d  admitted for IOL for Poly. A1GDM.   Subjective: Patient doing well- comfortable with epidural   Objective: BP (!) 149/87   Pulse 77   Temp 98.8 F (37.1 C) (Oral)   Resp 18   Ht 5' 2.5" (1.588 m)   Wt 247 lb (112 kg)   LMP 07/07/2016 (Approximate)   BMI 44.46 kg/m  or  Vitals:   05/08/17 1100 05/08/17 1130 05/08/17 1150 05/08/17 1200  BP: 131/80 127/72  (!) 149/87  Pulse: 65 74  77  Resp: 18 18  18   Temp:   98.8 F (37.1 C)   TempSrc:   Oral   Weight:      Height:        SROM @0348  this morning, IUPC and FSE placed @1008  Dilation: 5 Effacement (%): 70 Cervical Position: Posterior Station: -3 Presentation: Vertex Exam by:: Aundria Rudogers CNM FHT: baseline rate 140, moderate varibility, +acel, variable decel with each contraction  Toco: 2-4/ moderate   Labs: Lab Results  Component Value Date   WBC 8.2 05/07/2017   HGB 13.3 05/07/2017   HCT 38.9 05/07/2017   MCV 97.5 05/07/2017   PLT 224 05/07/2017    Patient Active Problem List   Diagnosis Date Noted  . Encounter for induction of labor 05/07/2017  . GDM (gestational diabetes mellitus), class A1   . [redacted] weeks gestation of pregnancy   . Polyhydramnios affecting pregnancy 04/28/2017  . Gestational diabetes mellitus (GDM) affecting pregnancy 12/30/2016  . Asthma 10/30/2016  . Rh negative status during pregnancy 10/30/2016  . Supervision of high-risk pregnancy 10/23/2016  . Tobacco abuse 07/14/2012    Assessment / Plan: 27 y.o. G2P0010 at 1878w3d here for IOL for Poly.   Labor: Slow to progress since SROM @0348 , IUPC and FSE placed- start pitocin titration. Amnioinfusion initiated.  Fetal Wellbeing:  Cat II Pain Control:  Epidural  Anticipated MOD:  SVD  Sharyon CableRogers, Raksha Wolfgang C, CNM 05/08/2017, 12:27 PM

## 2017-05-08 NOTE — Anesthesia Procedure Notes (Signed)
Epidural Patient location during procedure: OB  Staffing Anesthesiologist: Huy Majid, MD  Preanesthetic Checklist Completed: patient identified, pre-op evaluation, timeout performed, IV checked, risks and benefits discussed and monitors and equipment checked  Epidural Patient position: sitting Prep: DuraPrep Patient monitoring: blood pressure and continuous pulse ox Approach: right paramedian Location: L3-L4 Injection technique: LOR air  Needle:  Needle type: Tuohy  Needle gauge: 17 G Needle insertion depth: 7 cm Catheter type: closed end flexible Catheter size: 19 Gauge Catheter at skin depth: 14 cm Test dose: negative  Assessment Sensory level: T8  Additional Notes Dosing of Epidural:  1st dose, through catheter .............................................  Xylocaine 40 mg  2nd dose, through catheter, after waiting 3 minutes.........Xylocaine 60 mg    As each dose occurred, patient was free of IV sx; and patient exhibited no evidence of SA injection.  Patient is more comfortable after epidural dosed. Please see RN's note for documentation of vital signs,and FHR which are stable.  Patient reminded not to try to ambulate with numb legs, and that an RN must be present when she attempts to get up.            

## 2017-05-08 NOTE — Transfer of Care (Signed)
Immediate Anesthesia Transfer of Care Note  Patient: Meta HatchetJacqueline Gunnerson  Procedure(s) Performed: CESAREAN SECTION (N/A Abdomen)  Patient Location: PACU  Anesthesia Type:Epidural  Level of Consciousness: awake, alert  and oriented  Airway & Oxygen Therapy: Patient Spontanous Breathing  Post-op Assessment: Report given to RN and Post -op Vital signs reviewed and stable  Post vital signs: Reviewed and stable  Last Vitals:  Vitals Value Taken Time  BP    Temp    Pulse    Resp 24 05/08/2017  5:30 PM  SpO2    Vitals shown include unvalidated device data.  Last Pain:  Vitals:   05/08/17 1611  TempSrc: Axillary  PainSc:       Patients Stated Pain Goal: 6 (05/07/17 1216)  Complications: No apparent anesthesia complications

## 2017-05-08 NOTE — Progress Notes (Signed)
LABOR PROGRESS NOTE  Meta HatchetJacqueline Mozingo is a 27 y.o. G2P0010 at 428w3d  admitted for IOL for Polyhydramnios  Subjective: Patient verbalizes that she is "done being in labor".   Objective: BP (!) 147/86   Pulse 74   Temp 97.9 F (36.6 C) (Oral)   Resp 18   Ht 5' 2.5" (1.588 m)   Wt 247 lb (112 kg)   LMP 07/07/2016 (Approximate)   BMI 44.46 kg/m  or  Vitals:   05/08/17 1400 05/08/17 1430 05/08/17 1500 05/08/17 1530  BP: 123/81 125/69 128/86 (!) 147/86  Pulse: 69 60 65 74  Resp: 18 18 20 18   Temp:   97.9 F (36.6 C)   TempSrc:   Oral   Weight:      Height:       Cervix checked by Dr Emelda FearFerguson. Pitocin discontinued with prolonged deceleration  Dilation: 5 Effacement (%): 70 Cervical Position: Posterior Station: -3 Presentation: Vertex Exam by:: Dr Emelda FearFerguson  FHT: baseline rate 125, moderate varibility, +acel, prolonged decel and variables  Toco: 3-4  Labs: Lab Results  Component Value Date   WBC 8.2 05/07/2017   HGB 13.3 05/07/2017   HCT 38.9 05/07/2017   MCV 97.5 05/07/2017   PLT 224 05/07/2017    Patient Active Problem List   Diagnosis Date Noted  . Encounter for induction of labor 05/07/2017  . GDM (gestational diabetes mellitus), class A1   . [redacted] weeks gestation of pregnancy   . Polyhydramnios affecting pregnancy 04/28/2017  . Gestational diabetes mellitus (GDM) affecting pregnancy 12/30/2016  . Asthma 10/30/2016  . Rh negative status during pregnancy 10/30/2016  . Supervision of high-risk pregnancy 10/23/2016  . Tobacco abuse 07/14/2012    Assessment / Plan: 27 y.o. G2P0010 at 758w3d here for IOL for Poly   Dr Emelda FearFerguson called to bedside for assessment of cervical dilation along with heartrate. Dr Emelda FearFerguson recommends C/S due to fetal intolerance to labor. Discussed risks of C/S with patient- patient agrees to primary C/S for fetal intolerance to labor. See additional note for consent and operation.    Labor: Failure to progress. Fetal intolerance to labor.   Fetal Wellbeing:  Cat II  Pain Control:  Epidural  Anticipated MOD:  Primary C/S   Sharyon CableRogers, Caydance Kuehnle C, CNM  05/08/2017, 4:08 PM

## 2017-05-08 NOTE — Anesthesia Preprocedure Evaluation (Addendum)
Anesthesia Evaluation  Patient identified by MRN, date of birth, ID band Patient awake    Reviewed: Allergy & Precautions, H&P , Patient's Chart, lab work & pertinent test results  Airway Mallampati: II  TM Distance: >3 FB Neck ROM: full    Dental  (+) Teeth Intact   Pulmonary asthma , Current Smoker,    breath sounds clear to auscultation       Cardiovascular  Rhythm:regular Rate:Normal     Neuro/Psych    GI/Hepatic   Endo/Other  Morbid obesity  Renal/GU      Musculoskeletal   Abdominal   Peds  Hematology   Anesthesia Other Findings       Reproductive/Obstetrics (+) Pregnancy Now for c/s 2/2 fetal intolerance to labor                            Anesthesia Physical Anesthesia Plan  ASA: III  Anesthesia Plan: Epidural   Post-op Pain Management:    Induction:   PONV Risk Score and Plan: 3 and Scopolamine patch - Pre-op, Dexamethasone, Ondansetron and Treatment may vary due to age or medical condition  Airway Management Planned: Natural Airway  Additional Equipment:   Intra-op Plan:   Post-operative Plan:   Informed Consent: I have reviewed the patients History and Physical, chart, labs and discussed the procedure including the risks, benefits and alternatives for the proposed anesthesia with the patient or authorized representative who has indicated his/her understanding and acceptance.   Dental Advisory Given  Plan Discussed with:   Anesthesia Plan Comments: (Labs checked- platelets confirmed with RN in room. Fetal heart tracing, per RN, reported to be stable enough for sitting procedure. Discussed epidural, and patient consents to the procedure:  included risk of possible headache,backache, failed block, allergic reaction, and nerve injury. This patient was asked if she had any questions or concerns before the procedure started.)       Anesthesia Quick  Evaluation

## 2017-05-08 NOTE — Progress Notes (Addendum)
Objective: BP 128/80   Pulse 68   Temp 98.3 F (36.8 C) (Oral)   Resp 20   Ht 5' 2.5" (1.588 m)   Wt 112 kg (247 lb)   LMP 07/07/2016 (Approximate)   BMI 44.46 kg/m  No intake/output data recorded. No intake/output data recorded.  FHT:  FHR: 150 bpm, variability: moderate,  accelerations:  Abscent,  decelerations:  Present intermittent decels UC:   regular, every 3-4 minutes SVE:   Dilation: 4 Effacement (%): 50 Station: -3 Exam by:: Marius Ditchaniell Wade, RN  Labs: Lab Results  Component Value Date   WBC 8.2 05/07/2017   HGB 13.3 05/07/2017   HCT 38.9 05/07/2017   MCV 97.5 05/07/2017   PLT 224 05/07/2017    Assessment / Plan: Induction of labor due to polyhydramnios, A1GDM. S/P cytotec, s/p FB. Will start pit 4 hours from last cytotec dose. SROM  Labor: FB in place, likely pit after FB is out. SROM @ 0345. Preeclampsia:  N/A Fetal Wellbeing:  Category II Pain Control:  epidural I/D:  n/a Anticipated MOD:  NSVD   Mary Davies 05/08/2017, 4:32 AM

## 2017-05-08 NOTE — Op Note (Signed)
05/07/2017 - 05/08/2017  5:17 PM  PATIENT:  Mary Davies  27 y.o. female  PRE-OPERATIVE DIAGNOSIS:  fetal intolerance to labor pregnancy 39 weeks, failed induction of labor due to polyhydramnios, A1 diabetes  POST-OPERATIVE DIAGNOSIS:  fetal intolerance to labor due to nuchal cord x2, delivered failed induction of labor, polyhydramnios, A1 diabetes  PROCEDURE:  Procedure(s): CESAREAN SECTION (N/A) primary low transverse, 2 layer closure  SURGEON:  Surgeon(s) and Role:    Tilda Burrow* Rubyann Lingle V, MD - Primary  PHYSICIAN ASSISTANT:   ASSISTANTS: none   ANESTHESIA:   epidural and Intraperitoneal Nesacaine x30 cc  EBL:  604 mL   BLOOD ADMINISTERED:none  DRAINS: Urinary Catheter (Foley)   LOCAL MEDICATIONS USED:  Amount: Nesacaine 30 mL's ml and OTHER Nesacaine  SPECIMEN:  Source of Specimen:  Placenta to labor and delivered  DISPOSITION OF SPECIMEN:  N/A  COUNTS:  YES  TOURNIQUET:  * No tourniquets in log *  DICTATION: .Dragon Dictation  PLAN OF CARE: Patient has active admission orders  PATIENT DISPOSITION:  PACU - hemodynamically stable.   Delay start of Pharmacological VTE agent (>24hrs) due to surgical blood loss or risk of bleeding: not applicable Indications, recurrent variable decelerations at 5 cm dilated precluding further efforts at induction of labor, with efforts at amnioinfusion being unsuccessful Findings: Healthy female infant with nuchal cord x2 and small long umbilical cord  Details of procedure: Patient was taken the operating room prepped and draped for lower abdominal surgery timeout conducted Ancef administered and procedure confirmed by surgical team.  Transverse lower abdominal incision was made approximately 15 cm in length with sharp dissection in the midline to the fascia blunt dissection in the fatty tissues, sharp dissection of the fascia, with fascia sharply dissected off the underlying muscles and the peritoneal cavity entered in the midline  without difficulty.  The patient had some peritoneal discomfort some Nesacaine was introduced into the abdominal cavity x30 mL's and the Alexis retractor positioned.  Transverse lower uterine segment incision was made with bladder flap developed, and the fetal vertex rotated out of the incision.  The baby was somewhat asynclitic position and there was 2 loops of nuchal cord of the very thin cord with no Wharton's jelly.  There was no mal odor and there was no meconium.   Cord was clamped at 1 minute and the baby passed to waiting pediatricians please see the notes for further details Placenta delivered in response to daily uterine massage, the uterus is irrigated with saline solution, closed in a running locking 0 Monocryl first layer and continuous running 0 Monocryl second layer which incorporated the bladder flap.  The abdominal cavity was irrigated briefly the anterior peritoneum closed with running 2-0 Vicryl after "removal of instruments and Alexis retractor. 0 Vicryl closure of the fascia followed.  Subcutaneous tissues were irrigated confirmed as hemostatic with point cautery as it 3 sites due to veins it revealed themselves to be continued use, and 3 horizontal mattress sutures of 2 oh plain used to close the subcu fatty space followed by subcuticular 4-0 Vicryl closure of the skin sponge and needle counts were correct and patient recovery room in good condition

## 2017-05-08 NOTE — Progress Notes (Signed)
LABOR PROGRESS NOTE  Meta HatchetJacqueline Schum is a 27 y.o. G2P0010 at 5834w3d  admitted for IOL for Poly. Patient also has A1GDM- CBG has been WNL throughout labor.   Subjective: Patient is comfortable with epidural, agitated that she is still in labor. Discussed process of induction and how IOL can be extensive. Answered patient questions- patient verbalizes understanding   Objective: BP 125/69   Pulse 60   Temp 98.7 F (37.1 C)   Resp 18   Ht 5' 2.5" (1.588 m)   Wt 247 lb (112 kg)   LMP 07/07/2016 (Approximate)   BMI 44.46 kg/m  or  Vitals:   05/08/17 1300 05/08/17 1330 05/08/17 1400 05/08/17 1430  BP: 129/75 122/74 123/81 125/69  Pulse: 83 74 69 60  Resp: 18 18 18 18   Temp: 98.7 F (37.1 C)     TempSrc:      Weight:      Height:        Pitocin restarted @1415  Dilation: 5 Effacement (%): 70 Cervical Position: Posterior Station: -3 Presentation: Vertex Exam by:: Steward DroneVeronica Ahamed Hofland, CNM FHT: baseline rate 140, moderate varibility, +acel, variable and early decel Toco: 3-4 minutes/ mild-moderate by palpation  IUPC: MVU 100-210  Labs: Lab Results  Component Value Date   WBC 8.2 05/07/2017   HGB 13.3 05/07/2017   HCT 38.9 05/07/2017   MCV 97.5 05/07/2017   PLT 224 05/07/2017    Patient Active Problem List   Diagnosis Date Noted  . Encounter for induction of labor 05/07/2017  . GDM (gestational diabetes mellitus), class A1   . [redacted] weeks gestation of pregnancy   . Polyhydramnios affecting pregnancy 04/28/2017  . Gestational diabetes mellitus (GDM) affecting pregnancy 12/30/2016  . Asthma 10/30/2016  . Rh negative status during pregnancy 10/30/2016  . Supervision of high-risk pregnancy 10/23/2016  . Tobacco abuse 07/14/2012    Assessment / Plan: 27 y.o. G2P0010 at 4734w3d here for IOL for poly   Labor: Pitocin restarted at 1415, recheck cervix in 4 hours unless sooner is indicated. Fetal Wellbeing:  Cat II Pain Control:  Epidural  Anticipated MOD:  Hopeful SVD    Sharyon CableRogers, Michelyn Scullin C, CNM 05/08/2017, 2:46 PM

## 2017-05-08 NOTE — Op Note (Signed)
Please see the brief operative note for surgical details, section done for fetal intolerance of labor due to nuchal cord x2

## 2017-05-09 ENCOUNTER — Encounter (HOSPITAL_COMMUNITY): Payer: Self-pay | Admitting: Obstetrics and Gynecology

## 2017-05-09 LAB — CBC
HCT: 25.4 % — ABNORMAL LOW (ref 36.0–46.0)
HEMOGLOBIN: 8.7 g/dL — AB (ref 12.0–15.0)
MCH: 33.5 pg (ref 26.0–34.0)
MCHC: 34.3 g/dL (ref 30.0–36.0)
MCV: 97.7 fL (ref 78.0–100.0)
Platelets: 237 10*3/uL (ref 150–400)
RBC: 2.6 MIL/uL — AB (ref 3.87–5.11)
RDW: 15.3 % (ref 11.5–15.5)
WBC: 21.9 10*3/uL — ABNORMAL HIGH (ref 4.0–10.5)

## 2017-05-09 LAB — CREATININE, SERUM
Creatinine, Ser: 0.77 mg/dL (ref 0.44–1.00)
GFR calc Af Amer: >60 mL/min (ref >60)
GFR calc non Af Amer: >60 mL/min (ref >60)

## 2017-05-09 LAB — GLUCOSE, CAPILLARY: Glucose-Capillary: 104 mg/dL — ABNORMAL HIGH (ref 65–99)

## 2017-05-09 MED ORDER — LACTATED RINGERS IV BOLUS
500.0000 mL | Freq: Once | INTRAVENOUS | Status: AC
  Administered 2017-05-09: 500 mL via INTRAVENOUS

## 2017-05-09 MED ORDER — RHO D IMMUNE GLOBULIN 1500 UNIT/2ML IJ SOSY
300.0000 ug | PREFILLED_SYRINGE | Freq: Once | INTRAMUSCULAR | Status: AC
  Administered 2017-05-09: 300 ug via INTRAMUSCULAR
  Filled 2017-05-09: qty 2

## 2017-05-09 NOTE — Progress Notes (Signed)
Pt requesting to get up to bathroom (specifically shower) to wash her hair. States "I know I can't get in shower to take a shower." Requesting shower chair. Pt states she can't sleep without first washing her hair. Told pt RN locate a shower chair and return within 20-30 minutes to get her up. Pt expressed understanding.

## 2017-05-09 NOTE — Lactation Note (Signed)
This note was copied from a baby's chart. Lactation Consultation Note Baby 9 hrs old. Has no interest in feeding. Will not suck on bottle, or finger. Baby did however take some formula from spoon. Baby swallowed w/o spitting any formula out. Baby has been very spitty. Abd. Looks slightly distended.  Mom has large tubular breast w/inverted small nipples. Hand expression and breast massage w/No colostrum noted. Mom stated had no breast changes or tenderness during pregnancy.  Mom has hx. Of Lt. Breast abcess from across the top of nipple up to the anterior mid breast. Incision appears to be approx. 6 inches long. Mom stated they cut her open and she healed from the inside out. Left her breast open.  Rt. Breast had knot under areola mom stated size of a quarter. Mom encouraged to have OB assess breast. Mom stated she wasn't sure if she can BF or not.   Areola's w/edema. Reverse pressure w/little help. W/hand pump Lt. Nipple can be defined some, but not the Rt. Discussed using Ns to BF. Explained to mom when the baby starts feeding mom will have to use the NS for now, will keep being assessed for nipples to be compressible for latch w/o NS.  Mom shown how to use DEBP & how to disassemble, clean, & reassemble parts. Mom knows to pump q3h for 15-20 min. Mom states she knows the importance of pumping for lactation induction. No colostrum collect from pumping. Educated on supply and demand.  Mom encouraged to feed baby 8-12 times/24 hours and with feeding cues. Mom encouraged to waken baby for feeds if baby hasn't cued to feed in 3 hrs.  At this time baby isn't cueing, encouraged mom to cont. To look for cueing. STS, I&O important.  [facility]/LC brochure given w/resources, support groups and LC services.  Patient Name: Mary Davies RUEAV'WToday's Date: 05/09/2017 Reason for consult: Initial assessment;Infant < 6lbs   Maternal Data Has patient been taught Hand Expression?: Yes Does the patient have  breastfeeding experience prior to this delivery?: No  Feeding Feeding Type: Formula Nipple Type: Slow - flow Length of feed: 0 min(attempt per mom)  LATCH Score Latch: Too sleepy or reluctant, no latch achieved, no sucking elicited.  Audible Swallowing: None  Type of Nipple: Inverted  Comfort (Breast/Nipple): Soft / non-tender        Interventions Interventions: Breast feeding basics reviewed;DEBP;Breast massage;Hand express;Pre-pump if needed;Reverse pressure;Breast compression;Hand pump  Lactation Tools Discussed/Used Tools: Pump;Nipple Shields Nipple shield size: 16 Breast pump type: Double-Electric Breast Pump;Manual WIC Program: Yes Pump Review: Setup, frequency, and cleaning;Milk Storage Initiated by:: Peri JeffersonL. Miko Sirico RN IBCLC Date initiated:: 05/09/17   Consult Status Consult Status: Follow-up Date: 05/09/17 Follow-up type: In-patient    Mary Davies, Diamond NickelLAURA Davies 05/09/2017, 3:25 AM

## 2017-05-09 NOTE — Progress Notes (Signed)
Spoke with Dr. Orvan Falconerampbell regarding the pt's urine output after bolus. 75 cc concentrated amber urine emptied for [id number]. LR now running at 125cc/hr.To watch urine output over next hour.

## 2017-05-09 NOTE — Progress Notes (Addendum)
POSTPARTUM PROGRESS NOTE  Post Operative Day #1  Subjective:  Mary Davies is a 27 y.o. G2P1011 6931w3d s/p LTCS secondary to fetal intolerance of labor. Patient initially presented for induction of labor secondary to polyhydramnios (AFI >30). History further complicated by A1 GDM.  Overnight, patient had decreased urine output and received a total of 1L of LR.  Pt denies problems with ambulating, voiding or po intake.  She denies nausea or vomiting.  Pain is well controlled.  She has not had flatus. She has not had bowel movement.  Lochia Small.   Objective: Blood pressure 105/75, pulse 81, temperature 98.2 F (36.8 C), temperature source Oral, resp. rate 18, height 5' 2.5" (1.588 m), weight 112 kg (247 lb), last menstrual period 07/07/2016, SpO2 96 %, unknown if currently breastfeeding.  Physical Exam:  General: alert, cooperative and no distress Lochia:normal flow Chest: CTAB Heart: RRR no m/r/g Abdomen: +BS, soft, nontender,  Uterine Fundus: firm, palpable just inferior to the umbilicus DVT Evaluation: No calf swelling or tenderness Extremities: trace bilateral lower extremity edema  Recent Labs    05/07/17 0740 05/09/17 0533  HGB 13.3 8.7*  HCT 38.9 25.4*    Assessment/Plan:  ASSESSMENT: Mary HatchetJacqueline Wyffels is a 27 y.o. G2P1011 4631w3d s/p LTCS secondary to fetal intolerance of labor following a failed induction of labor for polyhydramnios (AFI >30). Pregnancy further complicated by A1 GDM.   Decreased urine output S/p 1L of LR Continue with mIVF with LR @125cc /hr. Encourage PO intake Monitor urine output closely  Anemia:  Suspect that hemoglobin decrease is related to volume from fluids as well as blood loss intra-operatively Patient is asymptomatic at this time. No signs of intra-abdominal bleeding Will obtain orthostatic vital signs  Postpartum care: Continue with foley in place while monitoring urine output closely Routine post-operative care otherwise Patient  will need 2hr GTT 6 weeks post-partum 2/2 A1 GDM  Contraception IUd   Anticipate discharge in the next 24-48hrs.    LOS: 2 days   Gorden HarmsMegan Campbell, MD PGY-3 05/09/2017, 8:10 AM   I confirm that I have verified the information documented in the resident's note and that I have also personally reperformed the physical exam and all medical decision making activities.

## 2017-05-09 NOTE — Progress Notes (Signed)
MOB was referred for history of depression/anxiety. * Referral screened out by Clinical Social Worker because none of the following criteria appear to apply: ~ History of anxiety/depression during this pregnancy, or of post-partum depression; Per MOB's Nursing Admission Summary "Pt has a hx of depression, but is fine now." No concerns noted in OB record.  ~ Diagnosis of anxiety and/or depression within last 3 years OR * MOB's symptoms currently being treated with medication and/or therapy. Please contact the Clinical Social Worker if needs arise, by MOB request, or if MOB scores greater than 9/yes to question 10 on Edinburgh Postpartum Depression Screen.  Mary Davies, MSW, LCSW Clinical Social Work [phone]  

## 2017-05-09 NOTE — Anesthesia Postprocedure Evaluation (Signed)
Anesthesia Post Note  Patient: Mary Davies  Procedure(s) Performed: CESAREAN SECTION (N/A Abdomen)     Patient location during evaluation: Mother Baby Anesthesia Type: Epidural Level of consciousness: awake and alert Pain management: pain level controlled Vital Signs Assessment: post-procedure vital signs reviewed and stable Respiratory status: spontaneous breathing, nonlabored ventilation and respiratory function stable Cardiovascular status: stable Postop Assessment: no headache, no backache, epidural receding, adequate PO intake, patient able to bend at knees and no apparent nausea or vomiting Anesthetic complications: no    Last Vitals:  Vitals:   05/09/17 0235 05/09/17 0628  BP: 106/62 105/75  Pulse: 73 81  Resp: 20 18  Temp: 36.5 C 36.8 C  SpO2: 100% 96%    Last Pain:  Vitals:   05/09/17 0730  TempSrc:   PainSc: 0-No pain   Pain Goal: Patients Stated Pain Goal: 3 (05/09/17 0628)               Laban EmperorMalinova,Shabria Egley Hristova

## 2017-05-09 NOTE — Progress Notes (Signed)
Pt's urine output from 2300-0235 only 75cc of dark amber concentrated urine. Fundus firm, lochia small. VSS. LR with 40 units of pitocin still infusing at 62.5cc/hr, with about 1 hour left for full bag to infuse. Dr. Orvan Falconerampbell called and order received for 500 cc LR bolus. Pause LR with pit while bolus infuses. Will continue to monitor.

## 2017-05-09 NOTE — Progress Notes (Signed)
Informed by charge RN that pt had called out because she had gotten up out of bed to bathroom and needed assistance cleaning up blood on leg and on pad. Charge RN assisted pt in changing pad. Again, pt had not called out for assistance with getting up.

## 2017-05-09 NOTE — Progress Notes (Signed)
In for shift change handoff, pt reports that she has been out of bed x2 with assist of FOB only. Pt did not call for assistance, reports felt dizzy x1 when she got up. Instructed pt to not get out of bed without calling for RN. Will attempt to get orthostatic VS at that time. Pt expressed understanding.

## 2017-05-09 NOTE — Progress Notes (Signed)
Called Dr. Orvan Falconerampbell regarding pt's urine output of 30cc in 1.5 hours. Order for LR bolus of 500 cc received.

## 2017-05-10 DIAGNOSIS — Z98891 History of uterine scar from previous surgery: Secondary | ICD-10-CM

## 2017-05-10 DIAGNOSIS — O34219 Maternal care for unspecified type scar from previous cesarean delivery: Secondary | ICD-10-CM

## 2017-05-10 LAB — CBC WITH DIFFERENTIAL/PLATELET
Basophils Absolute: 0 10*3/uL (ref 0.0–0.1)
Basophils Absolute: 0 10*3/uL (ref 0.0–0.1)
Basophils Relative: 0 %
Basophils Relative: 0 %
EOS ABS: 0.1 10*3/uL (ref 0.0–0.7)
EOS PCT: 1 %
EOS PCT: 2 %
Eosinophils Absolute: 0.1 10*3/uL (ref 0.0–0.7)
HCT: 21.9 % — ABNORMAL LOW (ref 36.0–46.0)
HEMATOCRIT: 20.3 % — AB (ref 36.0–46.0)
HEMOGLOBIN: 7.2 g/dL — AB (ref 12.0–15.0)
Hemoglobin: 6.9 g/dL — CL (ref 12.0–15.0)
LYMPHS ABS: 2.3 10*3/uL (ref 0.7–4.0)
LYMPHS PCT: 20 %
LYMPHS PCT: 31 %
Lymphs Abs: 2.7 10*3/uL (ref 0.7–4.0)
MCH: 33.3 pg (ref 26.0–34.0)
MCH: 33.5 pg (ref 26.0–34.0)
MCHC: 32.9 g/dL (ref 30.0–36.0)
MCHC: 34 g/dL (ref 30.0–36.0)
MCV: 101.4 fL — AB (ref 78.0–100.0)
MCV: 98.5 fL (ref 78.0–100.0)
MONO ABS: 0.4 10*3/uL (ref 0.1–1.0)
MONOS PCT: 7 %
MONOS PCT: 4 %
Monocytes Absolute: 0.8 10*3/uL (ref 0.1–1.0)
NEUTROS ABS: 5.7 10*3/uL (ref 1.7–7.7)
Neutro Abs: 7.9 10*3/uL — ABNORMAL HIGH (ref 1.7–7.7)
Neutrophils Relative %: 72 %
Neutrophils Relative %: 63 %
PLATELETS: 234 10*3/uL (ref 150–400)
PLATELETS: 236 10*3/uL (ref 150–400)
RBC: 2.16 MIL/uL — ABNORMAL LOW (ref 3.87–5.11)
RBC: 2.06 MIL/uL — ABNORMAL LOW (ref 3.87–5.11)
RDW: 15.1 % (ref 11.5–15.5)
RDW: 15.6 % — AB (ref 11.5–15.5)
WBC: 11.2 10*3/uL — AB (ref 4.0–10.5)
WBC: 8.9 10*3/uL (ref 4.0–10.5)

## 2017-05-10 LAB — GLUCOSE, CAPILLARY: Glucose-Capillary: 86 mg/dL (ref 65–99)

## 2017-05-10 LAB — BIRTH TISSUE RECOVERY COLLECTION (PLACENTA DONATION)

## 2017-05-10 LAB — RH IG WORKUP (INCLUDES ABO/RH)
ABO/RH(D): O NEG
FETAL SCREEN: NEGATIVE
GESTATIONAL AGE(WKS): 39.3
UNIT DIVISION: 0

## 2017-05-10 MED ORDER — OXYCODONE HCL 5 MG PO TABS
5.0000 mg | ORAL_TABLET | ORAL | Status: DC | PRN
Start: 2017-05-10 — End: 2017-05-11
  Administered 2017-05-10 – 2017-05-11 (×3): 5 mg via ORAL
  Filled 2017-05-10 (×3): qty 1

## 2017-05-10 MED ORDER — FERROUS SULFATE 325 (65 FE) MG PO TABS
325.0000 mg | ORAL_TABLET | Freq: Two times a day (BID) | ORAL | Status: DC
  Administered 2017-05-10 – 2017-05-11 (×3): 325 mg via ORAL
  Filled 2017-05-10 (×3): qty 1

## 2017-05-10 MED ORDER — SODIUM CHLORIDE 0.9 % IV SOLN
510.0000 mg | Freq: Once | INTRAVENOUS | Status: AC
  Administered 2017-05-11: 510 mg via INTRAVENOUS
  Filled 2017-05-10: qty 17

## 2017-05-10 MED ORDER — OXYCODONE HCL 5 MG PO TABS: 10.0000 mg | ORAL_TABLET | ORAL | Status: DC | PRN

## 2017-05-10 NOTE — Progress Notes (Addendum)
POSTPARTUM PROGRESS NOTE  Post Operative Day 2  Subjective:  Miriah Maruyama is a 27 y.o. G2P1011 [redacted]w[redacted]d s/p LTCS for fetal intolerance of labor, after a failed induction of labor for polyhydramnios.    No acute events overnight.  Pt denies problems with ambulating, voiding or po intake.  She denies nausea or vomiting.  Pain is well controlled.  She has had flatus. She has not had bowel movement.  Lochia Small.   Objective: Blood pressure 120/71, pulse 78, temperature 98 F (36.7 C), temperature source Oral, resp. rate 18, height 5' 2.5" (1.588 m), weight 112 kg (247 lb), last menstrual period 07/07/2016, SpO2 96 %, unknown if currently breastfeeding.  Physical Exam:  General: alert, cooperative and no distress Lochia:normal flow Chest: CTAB Heart: RRR no m/r/g Abdomen: +BS, soft, nontender,  Uterine Fundus: firm, palpable just inferior to the umbilicus DVT Evaluation: No calf swelling or tenderness Extremities: no lower extremity edema  Recent Labs    05/09/17 0533 05/10/17 0624  HGB 8.7* 7.2*  HCT 25.4* 21.9*    Assessment/Plan:  ASSESSMENT: Inesha Sow is a 27 y.o. G2P1011 [redacted]w[redacted]d s/p LTCS for fetal intolerance of labor, after a failed induction of labor for polyhydramnios. History further notable for A1 GDM.   UOP improved throughout the day yesterday Foley catheter has been removed Pain well-controlled Ambulating well Will repeat CBC at 8PM and transfuse if symptomatic or if <7 Will need 2hr GTT 6 weeks postpartum  Plan for discharge tomorrow pending stabilization of hemoglobin   LOS: 3 days   Gorden Harms, MD PGY-3 05/10/2017, 9:12 AM

## 2017-05-10 NOTE — Progress Notes (Signed)
Went to check on patient given decrease in hemoglobin and decreased urine output the night prior.   Patient had catheter removed during the morning, and has been urinating in a hat in the bathroom to measure urine output. She reports normal amount of urination. She has been ambulating without any difficulty. No chest pain, palpitations, dizziness. No excessive lochia.   Overall, patient reported feeling at baseline. She was up walking around the room taking care of her baby at the time of my evaluation.   Vitals have remained hemodynamically stable.

## 2017-05-10 NOTE — Progress Notes (Signed)
Hgb 6.9. Spoke with patient, she remains asymptomatic. NAD, BP 120s/80s, HR 80s-90s.  Discussed treatments options, including IV iron and blood transfusion. Pt opted to have IV iron done.  Feraheme 510 mg once ordered.   Mary Davies P. Anatalia Kronk, MD OB Fellow

## 2017-05-10 NOTE — Lactation Note (Signed)
This note was copied from a baby's chart. Lactation Consultation Note Baby 55 hrs old. Hasn't had stool or void since 1500 4/26. Over 15 hrs. Baby is receiving formula. Baby isn't going to breast. Mom has very large breast w/inverted nipple. Mom is giving formula. Mom is fine with giving formula. Baby is getting 22 cal Similac. Took 27 ml well. LC used green slow flow nipple. Gave baby to mom to hold upright.  Baby's abd. Appears distended.  Mom wanting to go home today. Stressed importance of strict I&O.   Patient Name: Girl Casha Estupinan ZOXWR'[id number] Date: 05/10/2017 Reason for consult: Follow-up assessment;Infant < 6lbs   Maternal Data    Feeding Feeding Type: Formula Nipple Type: Slow - flow  LATCH Score                   Interventions    Lactation Tools Discussed/Used Tools: Pump Nipple shield size: 16   Consult Status Consult Status: Follow-up Date: 05/10/17 Follow-up type: In-patient    Charyl Dancer 05/10/2017, 6:57 AM

## 2017-05-11 LAB — BPAM RBC
BLOOD PRODUCT EXPIRATION DATE: 201905282359
BLOOD PRODUCT EXPIRATION DATE: 201905282359
Unit Type and Rh: 9500
Unit Type and Rh: 9500

## 2017-05-11 LAB — TYPE AND SCREEN
ABO/RH(D): O NEG
Antibody Screen: POSITIVE
UNIT DIVISION: 0
UNIT DIVISION: 0

## 2017-05-11 MED ORDER — FERROUS SULFATE 325 (65 FE) MG PO TABS: 325.0000 mg | ORAL_TABLET | Freq: Two times a day (BID) | ORAL | 0 refills | Status: DC

## 2017-05-11 MED ORDER — IBUPROFEN 600 MG PO TABS: 600.0000 mg | ORAL_TABLET | Freq: Four times a day (QID) | ORAL | 0 refills | Status: DC

## 2017-05-11 MED ORDER — OXYCODONE HCL 5 MG PO TABS: 5.0000 mg | ORAL_TABLET | Freq: Four times a day (QID) | ORAL | 0 refills | Status: DC | PRN

## 2017-05-11 NOTE — Discharge Summary (Signed)
OB Discharge Summary     Patient Name: Mary Davies DOB: [birth year] MRN: [id number]  Date of admission: 05/07/2017 Delivering MD: Jonnie Kind   Date of discharge: 05/11/2017  Admitting diagnosis: INDUCTION Intrauterine pregnancy: [redacted]w[redacted]d    Secondary diagnosis:  Principal Problem:   Encounter for induction of labor Active Problems:   Supervision of high-risk pregnancy   Rh negative status during pregnancy   Gestational diabetes mellitus (GDM) affecting pregnancy   Polyhydramnios affecting pregnancy   Status post primary low transverse cesarean section      Discharge diagnosis: Term Pregnancy Delivered, GDM A1 and Anemia                                                                                                Post partum procedures:rhogam, IV iron infusion  Augmentation: Pitocin and Foley Balloon  Complications: None  Hospital course:  Induction of Labor With Cesarean Section  27y.o. yo G2P1011 at 359w3das admitted to the hospital 05/07/2017 for induction of labor for polyhydramnios. Patient had a labor course significant for fetal intolerance of labor. The patient went for cesarean section due to Non-Reassuring FHR, and delivered a Viable infant,05/08/2017  Membrane Rupture Time/Date: 3:48 AM ,05/08/2017   Details of operation can be found in separate operative Note.  Postpartum course was complicated by anemia, and Hgb was down to 6.9, but she remained asymptomatic. She received IV iron infusion and started on oral iron.  She is ambulating, tolerating a regular diet, passing flatus, and urinating well.  Patient is discharged home in stable condition on 05/11/17.                                    Physical exam  Vitals:   05/09/17 1758 05/10/17 0530 05/10/17 1804 05/11/17 0615  BP: 121/76 120/71 128/87 126/84  Pulse: 93 78 92 84  Resp: 18 18 18 18   Temp: 98 F (36.7 C) 98 F (36.7 C) 98.3 F (36.8 C) 97.9 F (36.6 C)  TempSrc: Oral Oral Oral Oral  SpO2:    95%   Weight:      Height:       General: alert, cooperative and no distress Lochia: appropriate Uterine Fundus: firm Incision: Dressing is clean, dry, but peeling off, so changed prior to discharge DVT Evaluation: No evidence of DVT seen on physical exam. Labs: Lab Results  Component Value Date   WBC 8.9 05/10/2017   HGB 6.9 (LL) 05/10/2017   HCT 20.3 (L) 05/10/2017   MCV 98.5 05/10/2017   PLT 236 05/10/2017   CMP Latest Ref Rng & Units 05/09/2017  Glucose 65 - 99 mg/dL -  BUN 6 - 20 mg/dL -  Creatinine 0.44 - 1.00 mg/dL 0.77  Sodium 135 - 145 mmol/L -  Potassium 3.5 - 5.1 mmol/L -  Chloride 101 - 111 mmol/L -  CO2 22 - 32 mmol/L -  Calcium 8.9 - 10.3 mg/dL -  Total Protein 6.5 - 8.1 g/dL -  Total Bilirubin 0.3 - 1.2  mg/dL -  Alkaline Phos 38 - 126 U/L -  AST 15 - 41 U/L -  ALT 14 - 54 U/L -    Discharge instruction: per After Visit Summary and "Baby and Me Booklet".  After visit meds:  Allergies as of 05/11/2017      Reactions   Pineapple Shortness Of Breath, Itching, Swelling      Medication List    STOP taking these medications   ACCU-CHEK FASTCLIX LANCETS Misc   ACCU-CHEK NANO SMARTVIEW w/Device Kit   glucose blood test strip Commonly known as:  ACCU-CHEK SMARTVIEW     TAKE these medications   albuterol 108 (90 Base) MCG/ACT inhaler Commonly known as:  PROVENTIL HFA;VENTOLIN HFA Inhale into the lungs every 6 (six) hours as needed for wheezing or shortness of breath.   ferrous sulfate 325 (65 FE) MG tablet Take 1 tablet (325 mg total) by mouth 2 (two) times daily with a meal.   ibuprofen 600 MG tablet Commonly known as:  ADVIL,MOTRIN Take 1 tablet (600 mg total) by mouth every 6 (six) hours.   oxyCODONE 5 MG immediate release tablet Commonly known as:  Oxy IR/ROXICODONE Take 1 tablet (5 mg total) by mouth every 6 (six) hours as needed for severe pain.   Prenatal Vitamins 0.8 MG tablet Take 1 tablet by mouth daily.       Diet: routine  diet  Activity: Advance as tolerated. Pelvic rest for 6 weeks.   Outpatient follow up:2 weeks and 4-6 weeks for incision check, and PPV  Postpartum contraception: IUD    Newborn Data: Live born female  Birth Weight: 5 lb 3.8 oz (2375 g) APGAR: 8, 9  Newborn Delivery   Birth date/time:  05/08/2017 16:43:00 Delivery type:  C-Section, Low Transverse Trial of labor:  Yes C-section categorization:  Primary     Baby Feeding: Breast Disposition:home with mother   05/11/2017 Gailen Shelter, MD

## 2017-05-12 ENCOUNTER — Encounter (HOSPITAL_COMMUNITY): Payer: Self-pay

## 2017-05-14 ENCOUNTER — Encounter: Payer: Self-pay | Admitting: General Practice

## 2017-05-14 ENCOUNTER — Telehealth: Payer: Self-pay | Admitting: General Practice

## 2017-05-14 ENCOUNTER — Telehealth: Payer: Self-pay | Admitting: Obstetrics and Gynecology

## 2017-05-14 NOTE — Telephone Encounter (Signed)
I left a message for the patient, asking her to call our office and give me a report on how she is feeling, as her discharge HGB was 6.9, much lower than explainable by documented blood loss at time of cesarean or documented lochia after delivery.

## 2017-05-14 NOTE — Telephone Encounter (Signed)
Called patient to schedule appointments for incision check, 2hr gtts, and Postpartum visit.  Left message for patient to give our office a call back.  Letter mailed to patient.

## 2017-05-17 ENCOUNTER — Inpatient Hospital Stay (HOSPITAL_COMMUNITY)
Admission: AD | Admit: 2017-05-17 | Discharge: 2017-05-18 | Disposition: A | Discharge status: Home / Self Care | Payer: Medicaid Other | Source: Ambulatory Visit | Attending: Obstetrics and Gynecology | Admitting: Obstetrics and Gynecology

## 2017-05-17 ENCOUNTER — Inpatient Hospital Stay (HOSPITAL_COMMUNITY): Payer: Medicaid Other

## 2017-05-17 ENCOUNTER — Encounter (HOSPITAL_COMMUNITY): Payer: Self-pay | Admitting: *Deleted

## 2017-05-17 DIAGNOSIS — T888XXA Other specified complications of surgical and medical care, not elsewhere classified, initial encounter: Secondary | ICD-10-CM | POA: Insufficient documentation

## 2017-05-17 DIAGNOSIS — R1031 Right lower quadrant pain: Secondary | ICD-10-CM | POA: Diagnosis not present

## 2017-05-17 DIAGNOSIS — Y838 Other surgical procedures as the cause of abnormal reaction of the patient, or of later complication, without mention of misadventure at the time of the procedure: Secondary | ICD-10-CM | POA: Insufficient documentation

## 2017-05-17 DIAGNOSIS — Z4801 Encounter for change or removal of surgical wound dressing: Secondary | ICD-10-CM | POA: Diagnosis not present

## 2017-05-17 DIAGNOSIS — Z79899 Other long term (current) drug therapy: Secondary | ICD-10-CM | POA: Diagnosis not present

## 2017-05-17 DIAGNOSIS — S301XXA Contusion of abdominal wall, initial encounter: Secondary | ICD-10-CM | POA: Insufficient documentation

## 2017-05-17 DIAGNOSIS — F17210 Nicotine dependence, cigarettes, uncomplicated: Secondary | ICD-10-CM | POA: Diagnosis not present

## 2017-05-17 DIAGNOSIS — J45909 Unspecified asthma, uncomplicated: Secondary | ICD-10-CM | POA: Insufficient documentation

## 2017-05-17 DIAGNOSIS — F329 Major depressive disorder, single episode, unspecified: Secondary | ICD-10-CM | POA: Diagnosis not present

## 2017-05-17 LAB — CBC WITH DIFFERENTIAL/PLATELET
BASOS ABS: 0 10*3/uL (ref 0.0–0.1)
BASOS PCT: 0 %
Eosinophils Absolute: 0.2 10*3/uL (ref 0.0–0.7)
Eosinophils Relative: 2 %
HCT: 28.4 % — ABNORMAL LOW (ref 36.0–46.0)
HEMOGLOBIN: 9 g/dL — AB (ref 12.0–15.0)
Lymphocytes Relative: 21 %
Lymphs Abs: 1.8 10*3/uL (ref 0.7–4.0)
MCH: 32.5 pg (ref 26.0–34.0)
MCHC: 31.7 g/dL (ref 30.0–36.0)
MCV: 102.5 fL — ABNORMAL HIGH (ref 78.0–100.0)
MONOS PCT: 5 %
Monocytes Absolute: 0.5 10*3/uL (ref 0.1–1.0)
NEUTROS ABS: 6.3 10*3/uL (ref 1.7–7.7)
NEUTROS PCT: 72 %
Platelets: 395 10*3/uL (ref 150–400)
RBC: 2.77 MIL/uL — AB (ref 3.87–5.11)
RDW: 16.1 % — ABNORMAL HIGH (ref 11.5–15.5)
WBC: 8.8 10*3/uL (ref 4.0–10.5)

## 2017-05-17 MED ORDER — HYDROMORPHONE HCL 1 MG/ML IJ SOLN
1.0000 mg | Freq: Once | INTRAMUSCULAR | Status: AC
  Administered 2017-05-17: 1 mg via INTRAVENOUS
  Filled 2017-05-17: qty 1

## 2017-05-17 MED ORDER — IOPAMIDOL (ISOVUE-300) INJECTION 61%
100.0000 mL | Freq: Once | INTRAVENOUS | Status: AC | PRN
  Administered 2017-05-17: 100 mL via INTRAVENOUS

## 2017-05-17 MED ORDER — IOPAMIDOL (ISOVUE-300) INJECTION 61%
30.0000 mL | Freq: Once | INTRAVENOUS | Status: AC | PRN
  Administered 2017-05-17: 30 mL via ORAL

## 2017-05-17 NOTE — MAU Note (Signed)
Dr. Vergie Living at bedside for exam. ABD dressing removed bottom part saturated with sangenous drainage. Replaced dressing. CT scan ordered.

## 2017-05-17 NOTE — MAU Note (Signed)
Pt arrived EMS. Had c-section on 4//25/2019. Stated had a little more pain today and she was standing up cooking and felt leaking from inciosion, looked down and a whole bunch of blood was coming out. Called EMS.  Abd is bruised up toward RUQ and feels hard to touch on lower segmanet of abd near incision site. Dark red blood oozing from distal right portion of incision. D.Lawson at bedside to evaluate.   ABD pad place on area will continue to monitor. Dr. Vergie Living will evaluate

## 2017-05-17 NOTE — MAU Provider Note (Addendum)
History   Status post cesarean section of 4/25 per Dr. Emelda Fear for fetal intolerance of labor.  Aunt today via EMS for incision bleeding and pain.  Upon arrival to unit patient was noted to have a constant small trickle from the right side of her incision.  She states this started approximately 1 hour ago.  CSN: [id number]  Arrival date & time 05/17/17  1855   None     Chief Complaint  Patient presents with  . Wound Check    HPI  Past Medical History:  Diagnosis Date  . Asthma    prn inhaler  . Closed left radial fracture 01/15/2011  . Depression    hx of, fine now  . Fracture of metatarsal bone of left foot 04/22/2015   5th metatarsal  . Left breast abscess 04/2015    Past Surgical History:  Procedure Laterality Date  . CESAREAN SECTION N/A 05/08/2017   Procedure: CESAREAN SECTION;  Surgeon: Tilda Burrow, MD;  Location: [facility] BIRTHING SUITES;  Service: Obstetrics;  Laterality: N/A;  . INCISION AND DRAINAGE ABSCESS Left 09/24/2012   Procedure: INCISION AND DRAINAGE BREAST ABSCESS;  Surgeon: Robyne Askew, MD;  Location: [facility] OR;  Service: General;  Laterality: Left;  . INCISION AND DRAINAGE ABSCESS Left 05/08/2015   Procedure: INCISION AND DRAINAGE LEFT BREAST  ABSCESS;  Surgeon: Chevis Pretty III, MD;  Location: [facility];  Service: General;  Laterality: Left;    Family History  Problem Relation Age of Onset  . Diabetes Mother   . Asthma Father     Social History   Tobacco Use  . Smoking status: Current Every Day Smoker    Packs/day: 0.25    Years: 10.00    Pack years: 2.50    Types: Cigarettes  . Smokeless tobacco: Never Used  . Tobacco comment: trying to quit  Substance Use Topics  . Alcohol use: No  . Drug use: No    OB History    Gravida  2   Para  1   Term  1   Preterm      AB  1   Living  1     SAB  1   TAB      Ectopic      Multiple  0   Live Births  1           Review of Systems  Constitutional: Negative.   HENT:  Negative.   Eyes: Negative.   Respiratory: Negative.   Cardiovascular: Negative.   Gastrointestinal: Positive for abdominal pain.  Endocrine: Negative.   Genitourinary: Positive for vaginal bleeding.  Musculoskeletal: Negative.   Skin: Negative.   Allergic/Immunologic: Negative.   Neurological: Negative.   Hematological: Negative.   Psychiatric/Behavioral: Negative.     Allergies  Pineapple  Home Medications    There were no vitals taken for this visit.  Physical Exam  Constitutional: She is oriented to person, place, and time. She appears well-developed and well-nourished.  HENT:  Head: Normocephalic.  Neck: Normal range of motion.  Cardiovascular: Normal rate, regular rhythm, normal heart sounds and intact distal pulses.  Pulmonary/Chest: Effort normal and breath sounds normal.  Abdominal: There is tenderness.  There is a small trickle of dark blood coming from the right side of the incision.  Large amount of bruising of the abdomen that is dark purple in color with dark greenish yellow tint. .  Abdomen is very tender to touch.  Musculoskeletal: Normal range of motion.  Neurological: She is alert and oriented to person, place, and time.  Skin: Skin is warm and dry.  Psychiatric: She has a normal mood and affect. Her behavior is normal. Judgment and thought content normal.    MAU Course  Procedures (including critical care time)  Labs Reviewed  CBC WITH DIFFERENTIAL/PLATELET  RAPID URINE DRUG SCREEN, HOSP PERFORMED   No results found.   1. Abdominal wall hematoma, initial encounter    Care turned over to J Melessia Kaus NP   MDM  VSS, sm trickle of dark blood coming from right side of incision. abd firm, tender to exam, suspect hematoma. Will consult Dr. Vergie Living. 20:30 Dr. Vergie Living in to see pt.   Ct Abdomen Pelvis W Wo Contrast  Result Date: 05/17/2017 CLINICAL DATA:  C-section 9 days ago with swelling on the left side and right lower quadrant pain EXAM: CT ABDOMEN  AND PELVIS WITHOUT AND WITH CONTRAST TECHNIQUE: Multidetector CT imaging of the abdomen and pelvis was performed following the standard protocol before and following the bolus administration of intravenous contrast. CONTRAST:  30mL ISOVUE-300 IOPAMIDOL (ISOVUE-300) INJECTION 61%, ISOVUE-300 IOPAMIDOL (ISOVUE-300) INJECTION 61% COMPARISON:  None. FINDINGS: Lower chest: Lung bases demonstrate no acute consolidation or effusion. Borderline cardiomegaly. Hepatobiliary: No focal liver abnormality is seen. No gallstones, gallbladder wall thickening, or biliary dilatation. Pancreas: Unremarkable. No pancreatic ductal dilatation or surrounding inflammatory changes. Spleen: Normal in size without focal abnormality. Adrenals/Urinary Tract: Adrenal glands are unremarkable. Kidneys are normal, without renal calculi, focal lesion, or hydronephrosis. Bladder is unremarkable. Stomach/Bowel: Stomach is within normal limits. Appendix appears normal. No evidence of bowel wall thickening, distention, or inflammatory changes. Vascular/Lymphatic: No significant vascular findings are present. No enlarged abdominal or pelvic lymph nodes. Reproductive: Enlarged uterus with thickened endometrial stripe, consistent with recent postpartum status. No adnexal mass. Other: Edema within the anterior abdominal wall. Enlargement of the lower rectus with hyperdense foci consistent with hematoma. Mildly complex fluid collection deep to the rectus sheath measuring 12.1 cm transverse by 2.8 cm AP, also likely due to hematoma. No definite extravasation. Oval complex fluid collection within the subcutaneous fat of the anterior pelvic wall with hyperdense foci measuring 12.5 x 4 cm also consistent with hematoma. Negative for free air. Musculoskeletal: No acute or significant osseous findings. IMPRESSION: 1. Enlargement of the lower rectus with multiple hyperdense foci, consistent with rectus sheath hematoma, this measures approximately 42 mm in AP  thickness. Additional complex fluid collections with hyperdense foci deep to the lower rectus measuring 12.1 and by 2.8 cm, and within the subcutaneous fat of the anterior lower pelvic wall measuring 12.5 x 4 cm, also consistent with hematoma. No definite active extravasation. 2. No right lower quadrant inflammatory changes.  Negative appendix. Electronically Signed   By: Jasmine Pang M.D.   On: 05/17/2017 22:55    CT results read to Dr. Vergie Living at 2300. Dr. Vergie Living to come to MAU to discuss findings with the patient.    Venia Carbon I, NP 05/17/2017 11:04 PM

## 2017-05-18 ENCOUNTER — Inpatient Hospital Stay (HOSPITAL_COMMUNITY)
Admission: AD | Admit: 2017-05-18 | Discharge: 2017-05-18 | Disposition: A | Discharge status: Home / Self Care | Payer: Medicaid Other | Source: Ambulatory Visit | Attending: Obstetrics & Gynecology | Admitting: Obstetrics & Gynecology

## 2017-05-18 ENCOUNTER — Other Ambulatory Visit: Payer: Self-pay

## 2017-05-18 DIAGNOSIS — O902 Hematoma of obstetric wound: Secondary | ICD-10-CM

## 2017-05-18 MED ORDER — HYDROMORPHONE HCL 1 MG/ML IJ SOLN
2.0000 mg | Freq: Once | INTRAMUSCULAR | Status: AC
  Administered 2017-05-18: 2 mg via INTRAMUSCULAR
  Filled 2017-05-18: qty 2

## 2017-05-18 MED ORDER — HYDROMORPHONE HCL 1 MG/ML IJ SOLN
1.0000 mg | Freq: Once | INTRAMUSCULAR | Status: AC
  Administered 2017-05-18: 1 mg via INTRAVENOUS

## 2017-05-18 MED ORDER — HYDROMORPHONE HCL 1 MG/ML IJ SOLN
INTRAMUSCULAR | Status: AC
  Filled 2017-05-18: qty 1

## 2017-05-18 MED ORDER — OXYCODONE HCL 5 MG PO TABS: 5.0000 mg | ORAL_TABLET | Freq: Four times a day (QID) | ORAL | 0 refills | Status: DC | PRN

## 2017-05-18 NOTE — Discharge Instructions (Signed)
Wound Check  If you have a wound, it may take some time to heal. Eventually, a scar will form. The scar will also fade with time. It is important to take care of your wound while it is healing. This helps to protect your wound from infection.  How should I take care of my wound at home?   Some wounds are allowed to close on their own or are repaired at a later date. There are many different ways to close and cover a wound, including stitches (sutures), skin glue, and adhesive strips. Follow your health care provider's instructions about:  ? Wound care.  ? Bandage (dressing) changes and removal.  ? Wound closure removal.   Take medicines only as directed by your health care provider.   Keep all follow-up visits as directed by your health care provider. This is important.   Do not take baths, swim, or use a hot tub until your health care provider approves. You may shower as directed by your health care provider.   Keep your wound clean and dry.  What affects scar formation?  Scars affect each person differently. How your body scars depends on:   The location and size of your wound.   Traits that you inherited from your parents (genetic predisposition).   How you take care of your wound. Irritation and inflammation increase the amount of scar formation.   Sun exposure. This can darken a scar.    When should I call or see my health care provider?  Call or see your health care provider if:   You have redness, swelling, or pain at your wound site.   You have fluid, blood, or pus coming from your wound.   You have muscle aches, chills, or a general ill feeling.   You notice a bad smell coming from the wound.   Your wound separates after the sutures, staples, or skin adhesive strips have been removed.   You have persistent nausea or vomiting.   You have a fever.   You are dizzy.    When should I call 911 or go to the emergency room?  Call 911 or go to the emergency room if:   You faint.   You have  difficulty breathing.    This information is not intended to replace advice given to you by your health care provider. Make sure you discuss any questions you have with your health care provider.  Document Released: 10/07/2003 Document Revised: 06/14/2015 Document Reviewed: 10/12/2013  Elsevier Interactive Patient Education  2018 Elsevier Inc.

## 2017-05-18 NOTE — MAU Provider Note (Signed)
History     CSN: [id number]  Arrival date and time: 05/18/17 4098   First Provider Initiated Contact with Patient 05/18/17 1026     Cc: inspect wound and dressing change No chief complaint on file.  [id number] No LMP recorded. 10 days postoperative from cesarean section. She was diagnosed yesterday with wound and rectus sheath hematoma. Dr. Vergie Living packed the open incision and asked her to return for dressing change today in MAU.  OB History    Gravida  2   Para  1   Term  1   Preterm      AB  1   Living  1     SAB  1   TAB      Ectopic      Multiple  0   Live Births  1           Past Medical History:  Diagnosis Date  . Asthma    prn inhaler  . Closed left radial fracture 01/15/2011  . Depression    hx of, fine now  . Fracture of metatarsal bone of left foot 04/22/2015   5th metatarsal  . Left breast abscess 04/2015    Past Surgical History:  Procedure Laterality Date  . CESAREAN SECTION N/A 05/08/2017   Procedure: CESAREAN SECTION;  Surgeon: Tilda Burrow, MD;  Location: [facility] BIRTHING SUITES;  Service: Obstetrics;  Laterality: N/A;  . INCISION AND DRAINAGE ABSCESS Left 09/24/2012   Procedure: INCISION AND DRAINAGE BREAST ABSCESS;  Surgeon: Robyne Askew, MD;  Location: [facility] OR;  Service: General;  Laterality: Left;  . INCISION AND DRAINAGE ABSCESS Left 05/08/2015   Procedure: INCISION AND DRAINAGE LEFT BREAST  ABSCESS;  Surgeon: Chevis Pretty III, MD;  Location: [facility];  Service: General;  Laterality: Left;    Family History  Problem Relation Age of Onset  . Diabetes Mother   . Asthma Father     Social History   Tobacco Use  . Smoking status: Current Every Day Smoker    Packs/day: 0.25    Years: 10.00    Pack years: 2.50    Types: Cigarettes  . Smokeless tobacco: Never Used  . Tobacco comment: trying to quit  Substance Use Topics  . Alcohol use: No  . Drug use: No    Allergies:  Allergies  Allergen Reactions  .  Pineapple Shortness Of Breath, Itching and Swelling    Medications Prior to Admission  Medication Sig Dispense Refill Last Dose  . albuterol (PROVENTIL HFA;VENTOLIN HFA) 108 (90 Base) MCG/ACT inhaler Inhale into the lungs every 6 (six) hours as needed for wheezing or shortness of breath.   Past Week at Unknown time  . ferrous sulfate 325 (65 FE) MG tablet Take 1 tablet (325 mg total) by mouth 2 (two) times daily with a meal. 60 tablet 0   . ibuprofen (ADVIL,MOTRIN) 600 MG tablet Take 1 tablet (600 mg total) by mouth every 6 (six) hours. 30 tablet 0   . oxyCODONE (OXY IR/ROXICODONE) 5 MG immediate release tablet Take 1 tablet (5 mg total) by mouth every 6 (six) hours as needed for severe pain. 20 tablet 0   . Prenatal Multivit-Min-Fe-FA (PRENATAL VITAMINS) 0.8 MG tablet Take 1 tablet by mouth daily. 30 tablet 12 05/06/2017 at Unknown time    Review of Systems  Constitutional: Negative.   Gastrointestinal: Positive for abdominal pain (at incision with bloody dressing).  Endocrine: Negative.   Genitourinary: Negative.    Physical Exam  Blood pressure 135/80, pulse 91, temperature 98.8 F (37.1 C), temperature source Oral, resp. rate 18, height  (1.575 m), weight 101.6 kg (224 lb), unknown if currently breastfeeding.  Physical Exam  Vitals reviewed. Constitutional: She is oriented to person, place, and time. She appears well-developed.  Cardiovascular: Normal rate.  Respiratory: Effort normal.  GI:  Gauze packing removed from the open wound and the defect was explored. I opened the defect another 3-4 cm. I packed the incision with 2 4x4 Raytex and covered with ABD pad and tape. She received 2 mg Dilaudid before the procedure. As per previous instructions she will be contacted by [facility] for an appointment tomorrow  Neurological: She is alert and oriented to person, place, and time.  Skin: Skin is warm and dry.  Psychiatric: She has a normal mood and affect. Her behavior is normal.     MAU Course  Procedures Wound check and dressing change MDM Her CT was reviewed and images viewed by me  Assessment and Plan  Wound hematoma for dressing change and arrange home health tomorrow in [facility]  Scheryl Darter 05/18/2017, 11:23 AM

## 2017-05-18 NOTE — MAU Note (Addendum)
PP c/s 10 days ago. Stiches came out yesterday. Pt is back from packing wound and was told to come back btw 09-10 for repacking of wound. Pt states was released this morning at 01 today  1020: MD (Dr. Debroah Loop) at bs assessing the wound. ABD pad taken off. Noted sero-sanguinous drainage.   MD left room to attend overhead page for code hemorrhage   Covered with sterile dressing since oozing until MD returns.   1041: Medicated per order.   1107: MD at bs. Sterile Dressing change done with pt tolerating procedure. Two raytex 4x4 used for packing.  Extra supplies given to pt with instructions to bring it back for next dressing changes. Pt verbalized understanding.  Paper form of AVS signature obtained since pt waiting out in hall area for discharge  1147: D/c instructions given with pt understanding. Pt left unit with family

## 2017-05-18 NOTE — Discharge Instructions (Signed)
Wound Packing Wound packing involves placing a moistened packing material into your wound and then covering it with an outer bandage (dressing). This helps promote proper healing of deep tissue and tissue under the skin. It also helps prevent bleeding, infection, and further injury. Wounds are packed until deep tissue has had time to heal. The time it takes for this to occur is different for everyone. Your health care provider will show you how to pack and dress your wound. Using gloves and a sterile technique is important in order to avoid spreading germs into your wound. What are the risks?  Infection.  Delayed or abnormal healing. How to pack your wound Follow your health care provider's instructions on how often you need to change dressings and pack your wound. You will likely be asked to change dressings 1-2 times a day. Supplies Needed  Gloves.  Wetting solution.  Clean bowl.  Packing material (gauze or gauze sponges).  Clean towels.  Outer dressing.  Tape.  Cotton balls or cotton-tipped swabs.  Small plastic bag. Preparing Your New Packing Material  1. Make sure your work surface or countertop is clean and disinfected. 2. Wash your hands well with soap and water. 3. Put a clean towel on the counter. 4. Put a clean bowl on the towel. Be sure to only touch the outside of the bowl when handling it. 5. Pour wetting solution into the bowl. 6. Cut your packing material (gauze or sponges) to the right size for your wound. Drop it into the bowl. 7. Cut four tape strips that you will use to seal the outer dressing. 8. Put cotton balls or cotton-tipped swabs on the clean towel. Removing the Old Packing and Dressing 1. Gently remove the old dressing and packing material. 2. Put the removed items into the plastic bag to throw away later. 3. Wash your hands well with soap and water again. Applying New Packing Material and Dressing 1. Put on your gloves. 2. Squeeze the packing  material in the bowl to release excess liquid. The packing material should be moist, but not dripping wet. 3. Gently place the packing material into the wound. Use a cotton ball or cotton-tipped swab to guide it into place, filling all of the space. 4. Dry your fingertips on the towel. 5. Open up your outer dressing supplies and put them on a dry part of the towel. Keep them from getting wet. 6. Place the dressing over the packed wound. 7. Tape the four outer edges of the dressing in place. 8. Remove your gloves. 9. Wash your hands again with soap and water. General Tips  Follow your health care provider's instructions on how tightly to pack the wound. At first, the wound will be packed tightly to help stop bleeding. As the wound begins to heal inside, you will use less packing material and pack the wound loosely to allow tissue to heal slowly from the inside out.  Keep the dressing clean and dry.  Follow any other instructions given by your health care provider on how to aid healing. This may include applying warm or cold compresses, elevating the affected area, or wearing a compression dressing.  Ask your health care provider about sun exposure and sunscreen when the dressings are no longer needed.  Keep all follow-up visits with your health care provider. This is important. Contact a health care provider if:  You have drainage, redness, swelling, or pain at your wound site.  You notice a bad smell coming from the  wound site.  Your pain is not controlled with pain medicine.  Tissue inside your wound changes color from pink to white, yellow, or black.  Your wound changes in size or depth.  You have a fever.  You have shaking chills.  You are having trouble packing your wound. This information is not intended to replace advice given to you by your health care provider. Make sure you discuss any questions you have with your health care provider. Document Released: 07/28/2013  Document Revised: 07/21/2015 Document Reviewed: 02/24/2013 Elsevier Interactive Patient Education  2018 Elsevier Inc. Hematoma A hematoma is a collection of blood. The collection of blood can turn into a hard, painful lump under the skin. Your skin may turn blue or yellow if the hematoma is close to the surface of the skin. Most hematomas get better in a few days to weeks. Some hematomas are serious and need medical care. Hematomas can be very small or very big. This information is not intended to replace advice given to you by your health care provider. Make sure you discuss any questions you have with your health care provider. Document Released: 02/08/2004 Document Revised: 06/08/2015 Document Reviewed: 06/10/2012 Elsevier Interactive Patient Education  2017 [employer].

## 2017-05-18 NOTE — MAU Note (Signed)
DR. Vergie Living at bedside to drain hematoma. Medicated pt with IV dilaudid. Incsion drained multiple dime sized clots removed with sanguinous fliud drained. Flushed with NS and packed with kerlex guaze. Pt tolerated well/fair. ABD dressing applied.

## 2017-05-18 NOTE — MAU Note (Signed)
Dr. Vergie Living back at bedside. Packing satuerated with serosanguinous fluid. Removed packing and repacked again with kerlex. Wound not draining at this time. Pt to return to MAU between 9-10am today to change dressing. Second dose of dilaudid given prior to discharge.

## 2017-05-19 ENCOUNTER — Other Ambulatory Visit: Payer: Self-pay

## 2017-05-19 ENCOUNTER — Ambulatory Visit: Payer: Medicaid Other | Admitting: Family Medicine

## 2017-05-19 ENCOUNTER — Telehealth (HOSPITAL_COMMUNITY): Payer: Self-pay | Admitting: *Deleted

## 2017-05-19 ENCOUNTER — Inpatient Hospital Stay (HOSPITAL_COMMUNITY)
Admission: AD | Admit: 2017-05-19 | Discharge: 2017-05-19 | Disposition: A | Discharge status: Home / Self Care | Payer: Medicaid Other | Source: Ambulatory Visit | Attending: Obstetrics & Gynecology | Admitting: Obstetrics & Gynecology

## 2017-05-19 ENCOUNTER — Inpatient Hospital Stay (HOSPITAL_COMMUNITY)
Admission: AD | Admit: 2017-05-19 | Discharge: 2017-05-19 | Discharge status: Against Medical Advice | Payer: Medicaid Other | Source: Ambulatory Visit | Attending: Obstetrics & Gynecology | Admitting: Obstetrics & Gynecology

## 2017-05-19 ENCOUNTER — Encounter: Payer: Self-pay | Admitting: General Practice

## 2017-05-19 DIAGNOSIS — Z4801 Encounter for change or removal of surgical wound dressing: Secondary | ICD-10-CM | POA: Insufficient documentation

## 2017-05-19 DIAGNOSIS — S31109D Unspecified open wound of abdominal wall, unspecified quadrant without penetration into peritoneal cavity, subsequent encounter: Secondary | ICD-10-CM

## 2017-05-19 DIAGNOSIS — F17210 Nicotine dependence, cigarettes, uncomplicated: Secondary | ICD-10-CM | POA: Insufficient documentation

## 2017-05-19 DIAGNOSIS — O99335 Smoking (tobacco) complicating the puerperium: Secondary | ICD-10-CM | POA: Insufficient documentation

## 2017-05-19 DIAGNOSIS — O902 Hematoma of obstetric wound: Secondary | ICD-10-CM

## 2017-05-19 MED ORDER — HYDROMORPHONE HCL 1 MG/ML IJ SOLN
1.0000 mg | Freq: Once | INTRAMUSCULAR | Status: AC
  Administered 2017-05-19: 1 mg via INTRAMUSCULAR
  Filled 2017-05-19: qty 1

## 2017-05-19 MED ORDER — OXYCODONE HCL 5 MG PO TABS: 5.0000 mg | ORAL_TABLET | Freq: Four times a day (QID) | ORAL | 0 refills | Status: DC | PRN

## 2017-05-19 NOTE — MAU Note (Signed)
Packing from wound was removed and replaced with kerlex soaked in saline.  ABD pad placed and taped down.

## 2017-05-19 NOTE — Progress Notes (Signed)
Patient seen for dressing change. Patient too uncomfortable to do it here. Will send to MAU. Will arrange for home Health care. Discussed using pain medicine 30 minutes prior to dressing changes to better tolerate. Oxycodone  tabs #15 given today.  Levie Heritage, DO

## 2017-05-19 NOTE — Progress Notes (Signed)
Patient came to my window complaining of pain in her incision from C-section that was performed on 05/08/17.  Patient stated that she was seen in MAU b/c her stitches came loose and was told to come to our office to re-check incision.  She voiced that she would need pain medicine before they take guaze off.  I went to consult with a nurse about the pain medicine, when I returned to my desk the patient wasn't anywhere to be found.  I tried calling patient, but she did not pick up.  Patient checked into MAU.

## 2017-05-19 NOTE — MAU Note (Signed)
Pt not in lobby . Called down to clinic and pt not shown up down there either.

## 2017-05-19 NOTE — MAU Note (Signed)
Pt was to be seen in clinic today for her dressing change.Micah Flesher down and left when they took someone back before her.

## 2017-05-19 NOTE — MAU Provider Note (Signed)
History     CSN: [id number]  Arrival date and time: 05/19/17 1559   None     Chief Complaint  Patient presents with  . Dressing Change   HPI Mary Davies is 27 y.o. G2P1011 presents for dressing change.  She was seen in the clinic this afternoon for change.  Call  By Dr. Adrian Blackwater that she was very uncomfortable and needs pain med before attempting to redress.  She is 10 days post-op C-Section with hx of evacuation of hematoma.  She took pain med this am but by the time of her appt the medication was not longer effective.  Dr. Adrian Blackwater would like for [place] to treat pain and change dressing.  He has given her a Rx for pain med to be used at home and is in the process of arranging dressing change with Home Health.  Haywood Lasso, Nurse manager was on the unit  and plans to get signed order by Dr. Adrian Blackwater.    Past Medical History:  Diagnosis Date  . Asthma    prn inhaler  . Closed left radial fracture 01/15/2011  . Depression    hx of, fine now  . Fracture of metatarsal bone of left foot 04/22/2015   5th metatarsal  . Left breast abscess 04/2015    Past Surgical History:  Procedure Laterality Date  . CESAREAN SECTION N/A 05/08/2017   Procedure: CESAREAN SECTION;  Surgeon: Tilda Burrow, MD;  Location: [facility] BIRTHING SUITES;  Service: Obstetrics;  Laterality: N/A;  . INCISION AND DRAINAGE ABSCESS Left 09/24/2012   Procedure: INCISION AND DRAINAGE BREAST ABSCESS;  Surgeon: Robyne Askew, MD;  Location: [facility];  Service: General;  Laterality: Left;  . INCISION AND DRAINAGE ABSCESS Left 05/08/2015   Procedure: INCISION AND DRAINAGE LEFT BREAST  ABSCESS;  Surgeon: Chevis Pretty III, MD;  Location: [facility];  Service: General;  Laterality: Left;    Family History  Problem Relation Age of Onset  . Diabetes Mother   . Asthma Father     Social History   Tobacco Use  . Smoking status: Current Every Day Smoker    Packs/day: 0.25    Years: 10.00    Pack years: 2.50    Types:  Cigarettes  . Smokeless tobacco: Never Used  . Tobacco comment: trying to quit  Substance Use Topics  . Alcohol use: No  . Drug use: No    Allergies:  Allergies  Allergen Reactions  . Pineapple Shortness Of Breath, Itching and Swelling    Medications Prior to Admission  Medication Sig Dispense Refill Last Dose  . albuterol (PROVENTIL HFA;VENTOLIN HFA) 108 (90 Base) MCG/ACT inhaler Inhale into the lungs every 6 (six) hours as needed for wheezing or shortness of breath.   Past Week at Unknown time  . ferrous sulfate 325 (65 FE) MG tablet Take 1 tablet (325 mg total) by mouth 2 (two) times daily with a meal. 60 tablet 0   . ibuprofen (ADVIL,MOTRIN) 600 MG tablet Take 1 tablet (600 mg total) by mouth every 6 (six) hours. 30 tablet 0   . oxyCODONE (OXY IR/ROXICODONE) 5 MG immediate release tablet Take 1 tablet (5 mg total) by mouth every 6 (six) hours as needed for severe pain. 15 tablet 0   . Prenatal Multivit-Min-Fe-FA (PRENATAL VITAMINS) 0.8 MG tablet Take 1 tablet by mouth daily. 30 tablet 12 05/06/2017 at Unknown time    Review of Systems  Constitutional: Negative for chills and fever.  HENT: Negative for  sore throat.   Gastrointestinal: Positive for abdominal pain (incisional pain).       Open incision  Genitourinary: Negative for vaginal bleeding (small amount of bleeding.).  Psychiatric/Behavioral:       Tearful   Physical Exam   Blood pressure 128/71, pulse 81, temperature 99 F (37.2 C), temperature source Oral, resp. rate 16, SpO2 95 %, unknown if currently breastfeeding.  Physical Exam  Nursing note and vitals reviewed. Constitutional: She is oriented to person, place, and time. She appears well-developed and well-nourished.  Cardiovascular: Normal rate.  GI: There is tenderness.  Open post operative incision.  Approx. 6cm X 3cm X3cm.  Neg for signs of infection.  Neg for odor.  Abdominal tissue without signs of infection.  Neurological: She is alert and oriented to  person, place, and time.  Skin: Skin is warm and dry.  Psychiatric: She has a normal mood and affect. Her behavior is normal.  Tearful, stating the pain is in the incisional area.  After pain medication effective, she had a normal affect    MAU Course  Procedures  MDM MSE Exam IM Dilaudid given prior to Dressing Change Dressing change Haywood Lasso, Nurse Manager in to discuss Home Health arrangements-Wound vac to be placed tomorrow.   FU in clinic in 1 week  Assessment and Plan  A:  Open post operative incision secondary to evacuation of hematoma       Post C- Section Delivery   P:  Home health being arranged by Haywood Lasso, Nurse Manager and Dr. Adrian Blackwater.        F/U in clinic in 1 week      Rx for pain med given by Dr. Adrian Blackwater       Return for worsening sxs  Dennison Mascot Key 05/19/2017, 4:27 PM

## 2017-05-19 NOTE — Addendum Note (Signed)
Addended by: Levie Heritage on: 05/19/2017 03:57 PM   Modules accepted: Orders

## 2017-05-19 NOTE — MAU Note (Signed)
Pt not in lobby. Assume she went back to clinic

## 2017-05-19 NOTE — MAU Note (Signed)
Patient is her for dressing change.

## 2017-05-19 NOTE — Care Management Note (Addendum)
Case Management Note  Patient Details  Name: Mary Davies MRN: [id number] Date of Birth: [birth year]   Choice offered to:   patient  DME Arranged:   negative pressure wound therapy ;  DME Agency:   [facility]  HH Arranged:   RN- 3x week and prn HH Agency:   [facility]  Status of Service:   completed    Additional Comments: Received call for home wound care for patient.  CM spoke to patient and reviewed demographics.  They are correct in Epic.  Patient said to use her boyfriend's phone 1st [phone].  List of Home Health Agencies given to patient.  She did not have preference.  Referral sent to [facility].  Phone number of [facility] [phone] and CM [phone] given to patient.  Per Dr. Adrian Blackwater patient will go home with dressing packed here at [facility] and tomorrow 05/20/17 Tuesday [facility] will go to the home and start Negative Pressure Wound Therapy on patient and do dressings then 3 x a week.  ( see orders).  Reviewed with patient and patient verbalized and in agreement with plan.  Referral called and given to Suzy Bouchard. With [facility] # [phone] and Lyndel Pleasure. Aware of order also.  No other need at this time.  Geoffery Lyons, RN 05/19/2017, 5:09 PM  Phone number patient wanted to use tomorrow 05/20/17 to contact her was [phone].  Suzy Bouchard. With [facility] made aware.

## 2017-05-23 ENCOUNTER — Ambulatory Visit (INDEPENDENT_AMBULATORY_CARE_PROVIDER_SITE_OTHER): Payer: Medicaid Other

## 2017-05-23 ENCOUNTER — Other Ambulatory Visit: Payer: Self-pay | Admitting: General Practice

## 2017-05-23 ENCOUNTER — Ambulatory Visit: Payer: Medicaid Other

## 2017-05-23 ENCOUNTER — Other Ambulatory Visit: Payer: Medicaid Other

## 2017-05-23 DIAGNOSIS — O2493 Unspecified diabetes mellitus in the puerperium: Secondary | ICD-10-CM

## 2017-05-23 DIAGNOSIS — Z5189 Encounter for other specified aftercare: Secondary | ICD-10-CM

## 2017-05-23 NOTE — Progress Notes (Signed)
Pt here today for 2hr.  Pt already had wound check appt scheduled prior to her receiving home health services and appt was not canceled.  Assessed pt's wound.  Wound vac in place and fx properly.  Pt's abdomen swollen and some bruising.  Pt encouraged to continue to take ibuprofen to help manage the pain and inflammation. I advised pt to continue with home health as prescribed and if she has any questions to please give the office a call.  Pt verbalized understanding.

## 2017-05-24 LAB — GLUCOSE TOLERANCE, 2 HOURS
Glucose, 2 hour: 77 mg/dL (ref 65–139)
Glucose, GTT - Fasting: 80 mg/dL (ref 65–99)

## 2017-05-24 NOTE — Progress Notes (Signed)
Patient seen and assessed by nursing staff.  Agree with documentation and plan.  

## 2017-05-26 ENCOUNTER — Inpatient Hospital Stay (HOSPITAL_COMMUNITY)
Admission: AD | Admit: 2017-05-26 | Discharge: 2017-05-26 | Disposition: A | Discharge status: Home / Self Care | Payer: Medicaid Other | Source: Ambulatory Visit | Attending: Obstetrics and Gynecology | Admitting: Obstetrics and Gynecology

## 2017-05-26 ENCOUNTER — Telehealth: Payer: Self-pay | Admitting: *Deleted

## 2017-05-26 DIAGNOSIS — Z79899 Other long term (current) drug therapy: Secondary | ICD-10-CM | POA: Insufficient documentation

## 2017-05-26 DIAGNOSIS — O902 Hematoma of obstetric wound: Secondary | ICD-10-CM | POA: Insufficient documentation

## 2017-05-26 DIAGNOSIS — F17210 Nicotine dependence, cigarettes, uncomplicated: Secondary | ICD-10-CM | POA: Insufficient documentation

## 2017-05-26 LAB — URINALYSIS, ROUTINE W REFLEX MICROSCOPIC
BILIRUBIN URINE: NEGATIVE
Bacteria, UA: NONE SEEN
GLUCOSE, UA: NEGATIVE mg/dL
KETONES UR: NEGATIVE mg/dL
Nitrite: NEGATIVE
PROTEIN: NEGATIVE mg/dL
Specific Gravity, Urine: 1.024 (ref 1.005–1.030)
pH: 5 (ref 5.0–8.0)

## 2017-05-26 LAB — CBC WITH DIFFERENTIAL/PLATELET
BASOS ABS: 0 10*3/uL (ref 0.0–0.1)
BASOS PCT: 0 %
EOS ABS: 0.3 10*3/uL (ref 0.0–0.7)
Eosinophils Relative: 3 %
HEMATOCRIT: 34.7 % — AB (ref 36.0–46.0)
Hemoglobin: 11.2 g/dL — ABNORMAL LOW (ref 12.0–15.0)
Lymphocytes Relative: 21 %
Lymphs Abs: 1.9 10*3/uL (ref 0.7–4.0)
MCH: 32.9 pg (ref 26.0–34.0)
MCHC: 32.3 g/dL (ref 30.0–36.0)
MCV: 102.1 fL — ABNORMAL HIGH (ref 78.0–100.0)
MONO ABS: 0.7 10*3/uL (ref 0.1–1.0)
Monocytes Relative: 8 %
NEUTROS ABS: 6.2 10*3/uL (ref 1.7–7.7)
NEUTROS PCT: 68 %
Platelets: 366 10*3/uL (ref 150–400)
RBC: 3.4 MIL/uL — ABNORMAL LOW (ref 3.87–5.11)
RDW: 15.2 % (ref 11.5–15.5)
WBC: 9.2 10*3/uL (ref 4.0–10.5)

## 2017-05-26 MED ORDER — HYDROMORPHONE HCL 1 MG/ML IJ SOLN
2.0000 mg | Freq: Once | INTRAMUSCULAR | Status: AC
  Administered 2017-05-26: 2 mg via INTRAVENOUS
  Filled 2017-05-26: qty 2

## 2017-05-26 MED ORDER — OXYCODONE-ACETAMINOPHEN 10-325 MG PO TABS: 1.0000 | ORAL_TABLET | ORAL | 0 refills | Status: AC | PRN

## 2017-05-26 MED ORDER — OXYCODONE-ACETAMINOPHEN 10-325 MG PO TABS: 1.0000 | ORAL_TABLET | ORAL | Status: DC | PRN

## 2017-05-26 MED ORDER — PROMETHAZINE HCL 25 MG/ML IJ SOLN
25.0000 mg | Freq: Once | INTRAMUSCULAR | Status: AC
  Administered 2017-05-26: 25 mg via INTRAVENOUS
  Filled 2017-05-26: qty 1

## 2017-05-26 NOTE — MAU Provider Note (Signed)
History     CSN: [id number]  Arrival date and time: 05/26/17 1231   None     Chief Complaint  Patient presents with  . Post-op Problem   HPI: Mary Davies presents for wound change. Pt Dx with abd wound hematoma on 05/17/17. Negative wound dressing started shortly afterwards. Pain with drsg change has been a problem, despite oral pain medication. Wound vac removed yesterday but pt was unable to tolerate replacement. HH sent to MAU for evaluation today. Pt denies any fever or chills. No bowel or bladder dysfunction    Past Medical History:  Diagnosis Date  . Asthma    prn inhaler  . Closed left radial fracture 01/15/2011  . Depression    hx of, fine now  . Fracture of metatarsal bone of left foot 04/22/2015   5th metatarsal  . Left breast abscess 04/2015    Past Surgical History:  Procedure Laterality Date  . CESAREAN SECTION N/A 05/08/2017   Procedure: CESAREAN SECTION;  Surgeon: Tilda Burrow, MD;  Location: [facility] BIRTHING SUITES;  Service: Obstetrics;  Laterality: N/A;  . INCISION AND DRAINAGE ABSCESS Left 09/24/2012   Procedure: INCISION AND DRAINAGE BREAST ABSCESS;  Surgeon: Robyne Askew, MD;  Location: [facility];  Service: General;  Laterality: Left;  . INCISION AND DRAINAGE ABSCESS Left 05/08/2015   Procedure: INCISION AND DRAINAGE LEFT BREAST  ABSCESS;  Surgeon: Chevis Pretty III, MD;  Location: [facility];  Service: General;  Laterality: Left;    Family History  Problem Relation Age of Onset  . Diabetes Mother   . Asthma Father     Social History   Tobacco Use  . Smoking status: Current Every Day Smoker    Packs/day: 0.25    Years: 10.00    Pack years: 2.50    Types: Cigarettes  . Smokeless tobacco: Never Used  . Tobacco comment: trying to quit  Substance Use Topics  . Alcohol use: No  . Drug use: No    Allergies:  Allergies  Allergen Reactions  . Pineapple Shortness Of Breath, Itching and Swelling    Medications Prior to Admission   Medication Sig Dispense Refill Last Dose  . albuterol (PROVENTIL HFA;VENTOLIN HFA) 108 (90 Base) MCG/ACT inhaler Inhale into the lungs every 6 (six) hours as needed for wheezing or shortness of breath.   Past Week at Unknown time  . oxyCODONE (OXY IR/ROXICODONE) 5 MG immediate release tablet Take 1 tablet (5 mg total) by mouth every 6 (six) hours as needed for severe pain. 15 tablet 0 05/26/2017 at Unknown time  . ferrous sulfate 325 (65 FE) MG tablet Take 1 tablet (325 mg total) by mouth 2 (two) times daily with a meal. 60 tablet 0   . ibuprofen (ADVIL,MOTRIN) 600 MG tablet Take 1 tablet (600 mg total) by mouth every 6 (six) hours. 30 tablet 0   . Prenatal Multivit-Min-Fe-FA (PRENATAL VITAMINS) 0.8 MG tablet Take 1 tablet by mouth daily. 30 tablet 12 05/06/2017 at Unknown time    Review of Systems Physical Exam   Blood pressure 108/71, pulse 81, temperature 99.1 F (37.3 C), temperature source Oral, resp. rate 18, height  (1.575 m), weight 98.9 kg (218 lb), SpO2 98 %, unknown if currently breastfeeding.  Physical Exam Obese WF in NAD. Lungs clear Heart RRR Abd soft + BS drsing intact MAU Course  Procedures Wound change After pt received IV pain medication. Drsg removed. No evidence of infection. Healthy tissue noted.  Wound cleaned  with NS. Packed with Curlex and ABD pad applied. Pt tolerated well  Wound measurements 2.5 cm x 9 cm  MDM CBC normal, no evidence of infection  Assessment and Plan  Wound change  Drsg change as above. Spoke with Clydie Braun at [facility]. Will change drsg changes to q 5 days. HH to placed wound vac tomorrow. Will change oral pain medications to oxycodone 10 mg.  Pt instructed to take 30 minutes prior to drsg change. Keep postpartum appt.   Mary Davies 05/26/2017, 4:13 PM

## 2017-05-26 NOTE — Telephone Encounter (Signed)
Received a voicemail message from 05/23/17 11:04 from Donita, A nurse with [facility] stating she is seeing this patient - she has a wound vac on her c/section wound and she changed her wound vac yesterday and it was very , very painful. She is calling to request an order for Versitel to put between gauze and wound bed to keep gauze from sticking to wound bed; but will still provide suction that is required for wound vac.    Per chart review do see that patient was seen in MAU today and wound vac is off; but will get wound vac replaced tomorrow.  Called Dr. Alysia Penna and discussed and he approved Versitel.  I called Donita,RN and gave her the order for the Versitel.

## 2017-05-26 NOTE — MAU Note (Signed)
Pt states she was sent in by the home health nurse to have her wound vac replaced.

## 2017-05-26 NOTE — Discharge Instructions (Signed)

## 2017-05-26 NOTE — Progress Notes (Signed)
Dr Alysia Penna in for dressing change.

## 2017-06-01 ENCOUNTER — Inpatient Hospital Stay (HOSPITAL_COMMUNITY)
Admission: AD | Admit: 2017-06-01 | Discharge: 2017-06-01 | Discharge status: Against Medical Advice | Payer: Medicaid Other | Source: Ambulatory Visit | Attending: Obstetrics & Gynecology | Admitting: Obstetrics & Gynecology

## 2017-06-01 ENCOUNTER — Encounter (HOSPITAL_COMMUNITY): Payer: Self-pay

## 2017-06-01 DIAGNOSIS — T8149XA Infection following a procedure, other surgical site, initial encounter: Secondary | ICD-10-CM

## 2017-06-01 DIAGNOSIS — L03311 Cellulitis of abdominal wall: Secondary | ICD-10-CM

## 2017-06-01 LAB — CBC WITH DIFFERENTIAL/PLATELET
BASOS ABS: 0 10*3/uL (ref 0.0–0.1)
BASOS PCT: 0 %
Eosinophils Absolute: 0.2 10*3/uL (ref 0.0–0.7)
Eosinophils Relative: 2 %
HEMATOCRIT: 34.2 % — AB (ref 36.0–46.0)
HEMOGLOBIN: 10.9 g/dL — AB (ref 12.0–15.0)
LYMPHS PCT: 20 %
Lymphs Abs: 2 10*3/uL (ref 0.7–4.0)
MCH: 32.2 pg (ref 26.0–34.0)
MCHC: 31.9 g/dL (ref 30.0–36.0)
MCV: 101.2 fL — AB (ref 78.0–100.0)
MONO ABS: 0.5 10*3/uL (ref 0.1–1.0)
MONOS PCT: 5 %
NEUTROS ABS: 7 10*3/uL (ref 1.7–7.7)
NEUTROS PCT: 73 %
Platelets: 398 10*3/uL (ref 150–400)
RBC: 3.38 MIL/uL — ABNORMAL LOW (ref 3.87–5.11)
RDW: 14.5 % (ref 11.5–15.5)
WBC: 9.7 10*3/uL (ref 4.0–10.5)

## 2017-06-01 NOTE — Progress Notes (Signed)
Pt left AMA. Called cell phone and spoke to boyfriend who said patient would all back. She did not. Called and left message. No call back. Provider aware.

## 2017-06-01 NOTE — MAU Provider Note (Signed)
Chief Complaint: Postpartum Complications and Fever   First Provider Initiated Contact with Patient 06/01/17 1953     SUBJECTIVE HPI: Mary Davies is a 27 y.o. G2P1011 at 3-1/2 weeks postpartum C-section who presents to Maternity Admissions reporting possible wound infection.  Was seen by home health nurse today because of delayed wound healing and her temperature was 100.4 and the nurse noted new redness and swelling on her left mid abdomen.  Patient took Percocet around 3 PM.  Past Medical History:  Diagnosis Date  . Asthma    prn inhaler  . Closed left radial fracture 01/15/2011  . Depression    hx of, fine now  . Fracture of metatarsal bone of left foot 04/22/2015   5th metatarsal  . Left breast abscess 04/2015   OB History  Gravida Para Term Preterm AB Living  SAB TAB Ectopic Multiple Live Births  1     0 1    # Outcome Date GA Lbr Len/2nd Weight Sex Delivery Anes PTL Lv  2 Term 05/08/17 [redacted]w[redacted]d  5 lb 3.8 oz (2.375 kg) F CS-LTranv EPI  LIV  1 SAB            Past Surgical History:  Procedure Laterality Date  . CESAREAN SECTION N/A 05/08/2017   Procedure: CESAREAN SECTION;  Surgeon: Tilda Burrow, MD;  Location: [facility] BIRTHING SUITES;  Service: Obstetrics;  Laterality: N/A;  . INCISION AND DRAINAGE ABSCESS Left 09/24/2012   Procedure: INCISION AND DRAINAGE BREAST ABSCESS;  Surgeon: Robyne Askew, MD;  Location: [facility] OR;  Service: General;  Laterality: Left;  . INCISION AND DRAINAGE ABSCESS Left 05/08/2015   Procedure: INCISION AND DRAINAGE LEFT BREAST  ABSCESS;  Surgeon: Chevis Pretty III, MD;  Location: [facility];  Service: General;  Laterality: Left;   Social History   Socioeconomic History  . Marital status: Single    Spouse name: Not on file  . Number of children: Not on file  . Years of education: Not on file  . Highest education level: Not on file  Occupational History  . Not on file  Social Needs  . Financial resource strain: Not on file   . Food insecurity:    Worry: Not on file    Inability: Not on file  . Transportation needs:    Medical: Not on file    Non-medical: Not on file  Tobacco Use  . Smoking status: Current Every Day Smoker    Packs/day: 0.25    Years: 10.00    Pack years: 2.50    Types: Cigarettes  . Smokeless tobacco: Never Used  . Tobacco comment: trying to quit  Substance and Sexual Activity  . Alcohol use: No  . Drug use: No  . Sexual activity: Yes    Birth control/protection: None  Lifestyle  . Physical activity:    Days per week: Not on file    Minutes per session: Not on file  . Stress: Not on file  Relationships  . Social connections:    Talks on phone: Not on file    Gets together: Not on file    Attends religious service: Not on file    Active member of club or organization: Not on file    Attends meetings of clubs or organizations: Not on file    Relationship status: Not on file  . Intimate partner violence:    Fear of current or ex partner: Not on file  Emotionally abused: Not on file    Physically abused: Not on file    Forced sexual activity: Not on file  Other Topics Concern  . Not on file  Social History Narrative  . Not on file   Family History  Problem Relation Age of Onset  . Diabetes Mother   . Asthma Father    No current facility-administered medications on file prior to encounter.    Current Outpatient Medications on File Prior to Encounter  Medication Sig Dispense Refill  . oxyCODONE-acetaminophen (PERCOCET) 10-325 MG tablet Take 1 tablet by mouth every 4 (four) hours as needed for pain.    Marland Kitchen albuterol (PROVENTIL HFA;VENTOLIN HFA) 108 (90 Base) MCG/ACT inhaler Inhale into the lungs every 6 (six) hours as needed for wheezing or shortness of breath.    . ferrous sulfate 325 (65 FE) MG tablet Take 1 tablet (325 mg total) by mouth 2 (two) times daily with a meal. 60 tablet 0  . ibuprofen (ADVIL,MOTRIN) 600 MG tablet Take 1 tablet (600 mg total) by mouth every 6  (six) hours. 30 tablet 0  . Prenatal Multivit-Min-Fe-FA (PRENATAL VITAMINS) 0.8 MG tablet Take 1 tablet by mouth daily. 30 tablet 12   Allergies  Allergen Reactions  . Pineapple Shortness Of Breath, Itching and Swelling    I have reviewed patient's Past Medical Hx, Surgical Hx, Family Hx, Social Hx, medications and allergies.   Review of Systems  Constitutional: Positive for fever. Negative for chills.  Gastrointestinal: Positive for abdominal pain.  Skin:       Positive for erythema, swelling, superficial tenderness of left mid abdomen    OBJECTIVE Patient Vitals for the past 24 hrs:  BP Temp Pulse Resp SpO2 Height Weight  06/01/17 1937 - - - - 94 % - -  06/01/17 1934 107/68 99 F (37.2 C) 78 18 -  (1.575 m) 217 lb (98.4 kg)   Constitutional: Well-developed, well-nourished female in no acute distress.  Cardiovascular: normal rate Respiratory: normal rate and effort.  GI: Abd soft, left mid abdomen erythematous, warm, swollen and mildly tender.  Mid 10 cm of incision open depth of approximately 6 cm.  Packing removed.  Scant serosanguineous drainage.  No purulent drainage.  Healthy granulation tissue visible. Neurologic: Alert and oriented x 4.   LAB RESULTS Results for orders placed or performed during the hospital encounter of 06/01/17 (from the past 24 hour(s))  CBC with Differential/Platelet     Status: Abnormal   Collection Time: 06/01/17  7:30 PM  Result Value Ref Range   WBC 9.7 4.0 - 10.5 K/uL   RBC 3.38 (L) 3.87 - 5.11 MIL/uL   Hemoglobin 10.9 (L) 12.0 - 15.0 g/dL   HCT 16.1 (L) 09.6 - 04.5 %   MCV 101.2 (H) 78.0 - 100.0 fL   MCH 32.2 26.0 - 34.0 pg   MCHC 31.9 30.0 - 36.0 g/dL   RDW 40.9 81.1 - 91.4 %   Platelets 398 150 - 400 K/uL   Neutrophils Relative % 73 %   Neutro Abs 7.0 1.7 - 7.7 K/uL   Lymphocytes Relative 20 %   Lymphs Abs 2.0 0.7 - 4.0 K/uL   Monocytes Relative 5 %   Monocytes Absolute 0.5 0.1 - 1.0 K/uL   Eosinophils Relative 2 %    Eosinophils Absolute 0.2 0.0 - 0.7 K/uL   Basophils Relative 0 %   Basophils Absolute 0.0 0.0 - 0.1 K/uL    MAU COURSE Orders Placed This Encounter  Procedures  .  CBC with Differential/Platelet   No orders of the defined types were placed in this encounter.  Discussed Hx, labs, exam w/ Dr. Despina Hidden.  Requested that he come assess patient.  Dr. Despina Hidden at bedside.  Recommends admission for IV antibiotics.  Patient agreed during their conversation, but when the nurse returned to room patient had left.  CNM requested that nurse call patient and ask her to come back.    MDM -Postop cellulitis.  Recommend admission and IV antibiotics.  ASSESSMENT 1. Cellulitis of abdominal wall   2. Cellulitis, wound, post-operative     PLAN Place orders for admission and vancomycin and Zosyn but patient left AMA without letting staff know.  Katrinka Blazing, [place], CNM 06/01/2017  9:10 PM

## 2017-06-01 NOTE — MAU Note (Signed)
Urine in lab 

## 2017-06-01 NOTE — Progress Notes (Signed)
Went to room; PT not there. Clothes and all belongings gone.  Provider made aware

## 2017-06-01 NOTE — MAU Note (Signed)
Pt states she had a c/s on 05/08/2017. States her wound vac came off on Thursday. Pt states she had her home health nurse came for a visit today and told her that her c/section incision was infected. States that her incision is red, and her abdomen is swollen. Pt states there is some yellow drainage and an odor. Pt states the incision hurts. Reports a fever of 100.4 at home today around 4pm.  Pt states she took her Oxycodone around 2-3pm today-helps with the pain. Rates 7/10.

## 2017-06-02 ENCOUNTER — Inpatient Hospital Stay (HOSPITAL_COMMUNITY)
Admission: AD | Admit: 2017-06-02 | Discharge: 2017-06-03 | DRG: 776 | Discharge status: Against Medical Advice | Payer: Medicaid Other | Source: Ambulatory Visit | Attending: Obstetrics and Gynecology | Admitting: Obstetrics and Gynecology

## 2017-06-02 ENCOUNTER — Encounter (HOSPITAL_COMMUNITY): Payer: Self-pay | Admitting: *Deleted

## 2017-06-02 ENCOUNTER — Other Ambulatory Visit: Payer: Self-pay

## 2017-06-02 DIAGNOSIS — O8601 Infection of obstetric surgical wound, superficial incisional site: Secondary | ICD-10-CM | POA: Diagnosis present

## 2017-06-02 DIAGNOSIS — T8149XA Infection following a procedure, other surgical site, initial encounter: Secondary | ICD-10-CM | POA: Diagnosis present

## 2017-06-02 DIAGNOSIS — O8600 Infection of obstetric surgical wound, unspecified: Secondary | ICD-10-CM | POA: Diagnosis not present

## 2017-06-02 LAB — CBC
HCT: 35.3 % — ABNORMAL LOW (ref 36.0–46.0)
Hemoglobin: 11.1 g/dL — ABNORMAL LOW (ref 12.0–15.0)
MCH: 32 pg (ref 26.0–34.0)
MCHC: 31.4 g/dL (ref 30.0–36.0)
MCV: 101.7 fL — AB (ref 78.0–100.0)
PLATELETS: 348 10*3/uL (ref 150–400)
RBC: 3.47 MIL/uL — AB (ref 3.87–5.11)
RDW: 14.6 % (ref 11.5–15.5)
WBC: 7.2 10*3/uL (ref 4.0–10.5)

## 2017-06-02 LAB — CREATININE, SERUM
Creatinine, Ser: 0.9 mg/dL (ref 0.44–1.00)
GFR calc Af Amer: >60 mL/min (ref >60)
GFR calc non Af Amer: >60 mL/min (ref >60)

## 2017-06-02 MED ORDER — MENTHOL 3 MG MT LOZG: 1.0000 | LOZENGE | OROMUCOSAL | Status: DC | PRN

## 2017-06-02 MED ORDER — VANCOMYCIN HCL IN DEXTROSE 1-5 GM/200ML-% IV SOLN
1000.0000 mg | Freq: Two times a day (BID) | INTRAVENOUS | Status: DC
  Administered 2017-06-02 (×2): 1000 mg via INTRAVENOUS
  Filled 2017-06-02 (×3): qty 200

## 2017-06-02 MED ORDER — TETANUS-DIPHTH-ACELL PERTUSSIS 5-2.5-18.5 LF-MCG/0.5 IM SUSP: 0.5000 mL | Freq: Once | INTRAMUSCULAR | Status: DC

## 2017-06-02 MED ORDER — OXYCODONE HCL 5 MG PO TABS
10.0000 mg | ORAL_TABLET | ORAL | Status: DC | PRN
  Administered 2017-06-02: 10 mg via ORAL
  Filled 2017-06-02: qty 2

## 2017-06-02 MED ORDER — WITCH HAZEL-GLYCERIN EX PADS: 1.0000 "application " | MEDICATED_PAD | CUTANEOUS | Status: DC | PRN

## 2017-06-02 MED ORDER — ACETAMINOPHEN 325 MG PO TABS: 650.0000 mg | ORAL_TABLET | ORAL | Status: DC | PRN

## 2017-06-02 MED ORDER — IBUPROFEN 600 MG PO TABS
600.0000 mg | ORAL_TABLET | Freq: Four times a day (QID) | ORAL | Status: DC
Start: 2017-06-02 — End: 2017-06-03
  Administered 2017-06-02 – 2017-06-03 (×6): 600 mg via ORAL
  Filled 2017-06-02 (×7): qty 1

## 2017-06-02 MED ORDER — SIMETHICONE 80 MG PO CHEW: 80.0000 mg | CHEWABLE_TABLET | ORAL | Status: DC

## 2017-06-02 MED ORDER — ENOXAPARIN SODIUM 40 MG/0.4ML ~~LOC~~ SOLN: 40.0000 mg | SUBCUTANEOUS | Status: DC

## 2017-06-02 MED ORDER — SENNOSIDES-DOCUSATE SODIUM 8.6-50 MG PO TABS: 2.0000 | ORAL_TABLET | ORAL | Status: DC

## 2017-06-02 MED ORDER — OXYCODONE HCL 5 MG PO TABS: 5.0000 mg | ORAL_TABLET | ORAL | Status: DC | PRN

## 2017-06-02 MED ORDER — SIMETHICONE 80 MG PO CHEW: 80.0000 mg | CHEWABLE_TABLET | Freq: Three times a day (TID) | ORAL | Status: DC

## 2017-06-02 MED ORDER — DIBUCAINE 1 % RE OINT: 1.0000 "application " | TOPICAL_OINTMENT | RECTAL | Status: DC | PRN

## 2017-06-02 MED ORDER — PRENATAL MULTIVITAMIN CH
1.0000 | ORAL_TABLET | Freq: Every day | ORAL | Status: DC
  Administered 2017-06-02 – 2017-06-03 (×2): 1 via ORAL
  Filled 2017-06-02 (×2): qty 1

## 2017-06-02 MED ORDER — HYDROMORPHONE HCL 2 MG/ML IJ SOLN
2.0000 mg | INTRAMUSCULAR | Status: AC
  Administered 2017-06-02: 2 mg via INTRAVENOUS
  Filled 2017-06-02: qty 1

## 2017-06-02 MED ORDER — HYDROMORPHONE HCL 2 MG/ML IJ SOLN
2.0000 mg | Freq: Once | INTRAMUSCULAR | Status: AC
  Administered 2017-06-02: 2 mg via INTRAVENOUS
  Filled 2017-06-02: qty 1

## 2017-06-02 MED ORDER — LACTATED RINGERS IV SOLN
INTRAVENOUS | Status: DC
  Administered 2017-06-02: 13:00:00 via INTRAVENOUS

## 2017-06-02 MED ORDER — COCONUT OIL OIL: 1.0000 "application " | TOPICAL_OIL | Status: DC | PRN

## 2017-06-02 MED ORDER — DIPHENHYDRAMINE HCL 25 MG PO CAPS: 25.0000 mg | ORAL_CAPSULE | Freq: Four times a day (QID) | ORAL | Status: DC | PRN

## 2017-06-02 MED ORDER — SIMETHICONE 80 MG PO CHEW: 80.0000 mg | CHEWABLE_TABLET | ORAL | Status: DC | PRN

## 2017-06-02 NOTE — Care Management Note (Signed)
Case Management Note  Patient Details  Name: Mary Davies MRN: [id number] Date of Birth: 12-15-1990  Subjective/Objective:      27 year old female admitted today with postoperative wound cellulitis.             Action/Plan:D/C when medically stable.  Expected Discharge Date:  06/04/17               Expected Discharge Plan:  Home w Home Health Services  In-House Referral:  Clinical Social Work  Discharge planning Services  CM Consult  Post Acute Care Choice:  Resumption of Svcs/PTA Provider Choice offered to:  Patient   DME Agency:  [facility] Agency:  [facility]  Additional Comments:CM received consult.  Pt remains active with [facility] for [facility] services and has NWPT at home from previous admission.  CM spoke with Clydie Braun at [facility] and confirmed pt is still active with [facility] and they will see at d/c.  CM spoke with pt and confirmed she has NWPT at home.  Pt is to have a family member bring NWPT to hospital tomorrow so can be placed at d/c.  CM will follow up with pt again tomorrow to be sure this has been done.  Kathi Der RNC-MNN, BSN  Jashira Cotugno G., RN 06/02/2017, 4:05 PM

## 2017-06-02 NOTE — Consult Note (Addendum)
WOC Nurse wound consult note Reason for Consult: Consult requested to reapply Vac dressing to abd wound; pt had a Vac machine at home prior to admission. Wound type: Full thickness post-op wound to lower abd fold Measurement: 1X9X3cm Wound bed: red and moist Drainage (amount, consistency, odor) mod amt tan drainage, some odor. Periwound: Generalized erythremia surrounding abd, hard to touch and very tender.  Pt medicated for pain prior to the procedure but was tearful and had mod amt discomfort. Dressing procedure/placement/frequency: Applied one piece black foam to cont suction, barrier ring applied to lower and side wound edges to assist with maintaining a seal.  Bridged the track pad above the wound so it can maintain a seal.  WOC team will plan to change the dresing Q M/W/F while the patient is in the hospital. Pt still has Vac machine at home and it can be connected upon admission if family members bring it in to the hospital. Cammie Mcgee MSN, RN, CWOCN, [place], CNS [phone]

## 2017-06-02 NOTE — H&P (Signed)
Mary Davies is an 27 y.o. female G2P1011 s/p primary cesarean section on 4/25 admitted with wound cellulitis. Patient with postoperative course complicated by wound hematoma. Incision was opened and wound vac was applied. Patient seen in MAU yesterday after home health assessemnt of wound revealed concerns for wound infection. Patient reports fever as high 100.4 with erythema. Patient left despite recommendation for IV antibiotic and returned today. Patient reports some incisional soreness and pain on the left aspect of the incision.     Past Medical History:  Diagnosis Date  . Asthma    prn inhaler  . Closed left radial fracture 01/15/2011  . Depression    hx of, fine now  . Fracture of metatarsal bone of left foot 04/22/2015   5th metatarsal  . Left breast abscess 04/2015    Past Surgical History:  Procedure Laterality Date  . CESAREAN SECTION N/A 05/08/2017   Procedure: CESAREAN SECTION;  Surgeon: Tilda Burrow, MD;  Location: [facility] BIRTHING SUITES;  Service: Obstetrics;  Laterality: N/A;  . INCISION AND DRAINAGE ABSCESS Left 09/24/2012   Procedure: INCISION AND DRAINAGE BREAST ABSCESS;  Surgeon: Robyne Askew, MD;  Location: [facility] OR;  Service: General;  Laterality: Left;  . INCISION AND DRAINAGE ABSCESS Left 05/08/2015   Procedure: INCISION AND DRAINAGE LEFT BREAST  ABSCESS;  Surgeon: Chevis Pretty III, MD;  Location: [facility];  Service: General;  Laterality: Left;    Family History  Problem Relation Age of Onset  . Diabetes Mother   . Asthma Father     Social History:  reports that she has been smoking cigarettes.  She has a 2.50 pack-year smoking history. She has never used smokeless tobacco. She reports that she does not drink alcohol or use drugs.  Allergies:  Allergies  Allergen Reactions  . Pineapple Shortness Of Breath, Itching and Swelling    Medications Prior to Admission  Medication Sig Dispense Refill Last Dose  . oxyCODONE-acetaminophen  (PERCOCET) 10-325 MG tablet Take 1 tablet by mouth every 4 (four) hours as needed for pain.   06/02/2017 at 0600  . Prenatal Multivit-Min-Fe-FA (PRENATAL VITAMINS) 0.8 MG tablet Take 1 tablet by mouth daily. 30 tablet 12 Past Month at Unknown time  . albuterol (PROVENTIL HFA;VENTOLIN HFA) 108 (90 Base) MCG/ACT inhaler Inhale into the lungs every 6 (six) hours as needed for wheezing or shortness of breath.   More than a month at Unknown time  . ferrous sulfate 325 (65 FE) MG tablet Take 1 tablet (325 mg total) by mouth 2 (two) times daily with a meal. 60 tablet 0 More than a month at Unknown time  . ibuprofen (ADVIL,MOTRIN) 600 MG tablet Take 1 tablet (600 mg total) by mouth every 6 (six) hours. 30 tablet 0 More than a month at Unknown time    ROS See pertinent in HPI Blood pressure 105/77, pulse 66, temperature 97.6 F (36.4 C), temperature source Oral, resp. rate 18, SpO2 95 %, unknown if currently breastfeeding. Physical Exam GENERAL: Well-developed, well-nourished female in no acute distress.  LUNGS: Clear to auscultation bilaterally.  HEART: Regular rate and rhythm. ABDOMEN: Soft, nontender, nondistended. No organomegaly. Incision: opened 10 cm in length and approximately 6 cm in depth. Good granulation tissue. Fascia intact. On left apsect of the incision, 2 cm above, is a well circumscribed area of induration measuring 2 x 2 cm, tender to touch PELVIC: Not indicated EXTREMITIES: No cyanosis, clubbing, or edema, 2+ distal pulses.  Results for orders placed or performed  during the hospital encounter of 06/01/17 (from the past 24 hour(s))  CBC with Differential/Platelet     Status: Abnormal   Collection Time: 06/01/17  7:30 PM  Result Value Ref Range   WBC 9.7 4.0 - 10.5 K/uL   RBC 3.38 (L) 3.87 - 5.11 MIL/uL   Hemoglobin 10.9 (L) 12.0 - 15.0 g/dL   HCT 13.0 (L) 86.5 - 78.4 %   MCV 101.2 (H) 78.0 - 100.0 fL   MCH 32.2 26.0 - 34.0 pg   MCHC 31.9 30.0 - 36.0 g/dL   RDW 69.6 29.5 - 28.4  %   Platelets 398 150 - 400 K/uL   Neutrophils Relative % 73 %   Neutro Abs 7.0 1.7 - 7.7 K/uL   Lymphocytes Relative 20 %   Lymphs Abs 2.0 0.7 - 4.0 K/uL   Monocytes Relative 5 %   Monocytes Absolute 0.5 0.1 - 1.0 K/uL   Eosinophils Relative 2 %   Eosinophils Absolute 0.2 0.0 - 0.7 K/uL   Basophils Relative 0 %   Basophils Absolute 0.0 0.0 - 0.1 K/uL    No results found.  Assessment/Plan: 27 yo G2P1011 s/p PLTCS on 4/25 with would cellulitis - Will admit for IV antibiotics - Pain management prn - Continue dressing changes - Will contact wound team for wound vac placement    Elon Lomeli 06/02/2017, 11:14 AM

## 2017-06-02 NOTE — Progress Notes (Signed)
Paged WC nurse for Leak Alarm. Was told to cut wound care tape and place at pubis or any place leak possibly detected and then call MD if requiring further assistance.

## 2017-06-02 NOTE — Progress Notes (Signed)
CSW met with patient in room 303.  When CSW arrived, patient was resting in bed. Patient's baby was asleep in the bassinet, but woke while CSW was meeting with patient.  Patient appeared comfortable and responded to infant's needs appropriately. Patient's was tearful and explained to CSW, "Gerald Stabs, my boyfriend just broke up with me and is at my home with another women."  CSW validated and normalized patient's thoughts and feelings.  CSW also assessed for safety and patient denied SI and HI. Patient reported little to no supports by relatives and close friends.  Patient stated that she has a friend name Audelia Acton that will be returning patient's bank card in a few hours.   CSW processed with patient the best plan for patient to received medical care and take care of patient's daughter.  CSW concluded with patient that infant can remain in the room with patient, allowing patient to provide all the care for infant; patient agreed.  Patient is aware that staff will not be able to provide supervision for infant while patient take a cigarette break; patient understood.  Patient agreed that patient will take cigarette break when patient's friend Audelia Acton returns to bring patient her bank card.  CSW provided patient information for Medicaid transportation, and patient agreed to follow-up.  CSW also offered outpatient counseling and patient declined.  CSW updated bedside nurse.  CSW will reassess patient on tomorrow (06/03/17).  Laurey Arrow, MSW, LCSW Clinical Social Work [phone]

## 2017-06-02 NOTE — Progress Notes (Signed)
Wound Vac had been leaking since this afternoon. Dr. Debroah Loop to the bedside this PM. Discussed pt. Care. This evening I changed the wound vac. Pt. Medicated with Dilaudid prior to change. Pt. Tolerated procedure well. Paige, AC to bedside to assist with change.  Measurements were 3 cm deep x 9 cm long x 3.5 wide.  Interior appears to be healing well, pink and moist with minimal serosanguinous drainage.  Pt. instructed on infection prevention as she was seen prior to change touching the dressing. Also pt. Was encourage to not sit for prolonged periods upright at 90 deg. As it places a large amount of pressure on the wound device.  Will continue to monitor.

## 2017-06-02 NOTE — Plan of Care (Signed)
Vac changed this Pm. Pt. Education included with chg. Please see Progress notes for specifics about wound. Will continue to monitor.

## 2017-06-03 LAB — CBC
HCT: 34.7 % — ABNORMAL LOW (ref 36.0–46.0)
HEMOGLOBIN: 10.9 g/dL — AB (ref 12.0–15.0)
MCH: 31.9 pg (ref 26.0–34.0)
MCHC: 31.4 g/dL (ref 30.0–36.0)
MCV: 101.5 fL — ABNORMAL HIGH (ref 78.0–100.0)
Platelets: 313 10*3/uL (ref 150–400)
RBC: 3.42 MIL/uL — ABNORMAL LOW (ref 3.87–5.11)
RDW: 14.5 % (ref 11.5–15.5)
WBC: 5.7 10*3/uL (ref 4.0–10.5)

## 2017-06-03 MED ORDER — VANCOMYCIN HCL IN DEXTROSE 1-5 GM/200ML-% IV SOLN
1000.0000 mg | Freq: Three times a day (TID) | INTRAVENOUS | Status: DC
  Administered 2017-06-03 (×2): 1000 mg via INTRAVENOUS
  Filled 2017-06-03 (×3): qty 200

## 2017-06-03 NOTE — Care Management Note (Signed)
Case Management Note  Patient Details  Name: Mary Davies MRN: [id number] Date of Birth: 06-Nov-1990  Subjective/Objective:                 Wound infection   Action/Plan: Negative Pressure Wound Therapy Device  Expected Discharge Date:  06/04/17               Expected Discharge Plan:  Mary Davies  In-House Referral:  Clinical Social Work  Discharge planning Services  CM Consult  Post Acute Care Choice:  Resumption of Svcs/PTA Provider Choice offered to:  Patient  DME Arranged:  Negative Pressure Wound Therapy  DME Agency:  [place]:  RN- dressing changes 3x week [place]:  [place]  Status of Service:  Completed, signed off    Additional Comments: CM met with patient and verified demographics.  Patient is active with [facility] and wants to continue with [place].  Patient has a Negative Pressure Wound Therapy at her home and plans to have boyfriend bring to hospital prior to discharge for [facility]- liaison Richarda Blade # [phone] come and switch out Hospital VAC to [name] NPWT prior to patient going home.  CM called and spoke to [employer]. With [facility] and she is aware of plan. Patient verbalized understanding. Dr. Elly Modena placed order in Epic.    Yong Channel, RN 06/03/2017, 10:59 AM

## 2017-06-03 NOTE — Progress Notes (Signed)
Pt was in relatively good spirits.  She had a friend visiting to meet her baby for the first time.  She does have limited support, but seems to take that in stride.  She is eager to get home and even more eager for her wound to heal.  She appears confident and gentle in her care of her daughter.  She did not mention conflict with FOB and stated that they live together and that he works a lot and is not often available to help her with the baby.  She stated no additional needs at this time.  Chaplain Dyanne Carrel, Bcc Pager, [phone] 4:32 PM    06/03/17 1600  Clinical Encounter Type  Visited With Patient  Visit Type Initial  Referral From Care management  Spiritual Encounters  Spiritual Needs Emotional

## 2017-06-03 NOTE — Progress Notes (Signed)
Subjective: Patient reports feeling well. Her pain is better controlled. She is eager to be discharged  Objective: I have reviewed patient's vital signs and medications. Blood pressure 116/62, pulse 66, temperature 98.2 F (36.8 C), temperature source Oral, resp. rate 18, height  (1.575 m), weight 217 lb (98.4 kg), SpO2 97 %, unknown if currently breastfeeding.  General: alert, cooperative and no distress Resp: clear to auscultation bilaterally Cardio: regular rate and rhythm GI: soft, non-tender; bowel sounds normal; no masses,  no organomegaly and incision: wound vac in place Extremities: extremities normal, atraumatic, no cyanosis or edema and Homans sign is negative, no sign of DVT  Assessment: s/p cesarean section on 4/25 with wound cellulitis: stable and progressing well  Plan: Continue IV antibiotics Discharge planning tomorrow if she remains afebrile Wound care by home health nurse to be coordinated in preparation for discharge  LOS: 1 day    Mary Davies 06/03/2017, 9:58 AM

## 2017-06-03 NOTE — Progress Notes (Addendum)
When Tech went to round on pt at 1820, pt was not in room and neither was personal belongings. Wound Vac was on the floor. MD notified. Tech and RN tried all numbers listed for patient and could not reach pt to leave pt a message to return to hospital to have IV removed.

## 2017-06-04 ENCOUNTER — Other Ambulatory Visit: Payer: Self-pay | Admitting: Obstetrics and Gynecology

## 2017-06-04 ENCOUNTER — Telehealth: Payer: Self-pay | Admitting: General Practice

## 2017-06-04 DIAGNOSIS — L03311 Cellulitis of abdominal wall: Secondary | ICD-10-CM

## 2017-06-04 DIAGNOSIS — T8149XA Infection following a procedure, other surgical site, initial encounter: Secondary | ICD-10-CM

## 2017-06-04 DIAGNOSIS — O99345 Other mental disorders complicating the puerperium: Principal | ICD-10-CM

## 2017-06-04 DIAGNOSIS — F53 Postpartum depression: Secondary | ICD-10-CM

## 2017-06-04 MED ORDER — FERROUS SULFATE 325 (65 FE) MG PO TABS
325.0000 mg | ORAL_TABLET | Freq: Two times a day (BID) | ORAL | 0 refills | Status: DC
Start: 2017-06-04 — End: 2019-03-17

## 2017-06-04 MED ORDER — OXYCODONE-ACETAMINOPHEN 10-325 MG PO TABS: 1.0000 | ORAL_TABLET | ORAL | 0 refills | Status: DC | PRN

## 2017-06-04 MED ORDER — CEPHALEXIN 500 MG PO CAPS: 500.0000 mg | ORAL_CAPSULE | Freq: Four times a day (QID) | ORAL | 0 refills | Status: DC

## 2017-06-04 MED ORDER — IBUPROFEN 600 MG PO TABS: 600.0000 mg | ORAL_TABLET | Freq: Four times a day (QID) | ORAL | 0 refills | Status: DC

## 2017-06-04 NOTE — Telephone Encounter (Signed)
Called patient to schedule with our [facility] Clinician for 06/11/17 at 1:00pm.  Patient voiced understanding.

## 2017-06-04 NOTE — Telephone Encounter (Signed)
[facility] called and left message on nurse line stating they need verbal orders to resume care for this patient and also would like a social work order as the patient doesn't have adequate access to resources. They left call back number [phone]. Called patient & provided verbal orders per Dr Macon Large. They verbalized understanding & had no questions.

## 2017-06-06 NOTE — Discharge Summary (Signed)
Physician Discharge Summary  Patient ID: Mary, Davies MRN: [id number] DOB/AGE: 12-Jul-1990 27 y.o.  Admit date: 06/02/2017 Discharge date: 06/03/2017  Admission Diagnoses:  Discharge Diagnoses:  Principal Problem:   Wound cellulitis   Discharged Condition: Patient left against medical advise  Hospital Course: Patient admitted for parental treatment of wound cellulitis. Patient has been followed closely outpatient due to wound infection requiring wound vac and home health assistance. Patient presented on 5/19 to MAU with wound cellulitis. She declined admission on day day but returned the following day. Patient received IV antibiotics with improvement in her cellulitis. Plan was to discharge the following morning but patient left against medical advise. Patient was contacted by telephone the next day and informed that a prescription for Keflex was sent to her pharmacy. Follow up with behavioral health [name]) coordinated as well. Patient has a follow up appointment in our office already scheduled    Discharge Exam: Blood pressure 126/87, pulse 68, temperature 99.4 F (37.4 C), temperature source Oral, resp. rate 18, weight 217 lb (98.4 kg), SpO2 99 %.  Patient left AMA  Disposition:    Allergies as of 06/06/2017   No Known Allergies         Signed: Robbie Rideaux 06/06/2017, 1:28 PM

## 2017-06-10 NOTE — BH Specialist Note (Signed)
Integrated Behavioral Health Initial Visit  MRN: [id number] Name: Mary Davies  Number of Integrated Behavioral Health Clinician visits:: 1/6 Session Start time: 1:10  Session End time: 1:40 Total time: 30 minutes  Type of Service: Integrated Behavioral Health- Individual/Family Interpretor:No. Interpretor Name and Language: n/a   Warm Hand Off Completed.       SUBJECTIVE: Mary Davies is a 27 y.o. female accompanied by n/a Patient was referred by Dr Jolayne Panther for depression postpartum. Patient reports the following symptoms/concerns: Pt states she began feeling depressed as a result of her wound. Pt says her primary concern today is pain during and after the home health nurse changes her wound dressing, and she is requesting pain medication. Pt has been taking 2 at a time prior to nurse arrival, to be able to tolerate the pain, and ran out yesterday. Pt self-reports a history of Bipolar affective disorder, PTSD, and ADHD, that was treated with medication at 27yo; no medication or treatment since teen years.  Duration of problem: Less than one month; Severity of problem: mild  OBJECTIVE: Mood: Angry and Affect: Tearful Risk of harm to self or others: No plan to harm self or others  LIFE CONTEXT: Family and Social: Pt lives with FOB and newborn daughter School/Work: FOB works Community education officer; pt plans to go back to work after wound heals Self-Care: - Life Changes: Recent childbirth via cesarean; cellulitis of abdominal wall, and breast abscess  GOALS ADDRESSED: Patient will: 1. Reduce symptoms of: agitation and depression 2. Demonstrate ability to: Increase healthy adjustment to current life circumstances  INTERVENTIONS: Interventions utilized: Psychoeducation and/or Health Education  Standardized Assessments completed: GAD-7 and PHQ 9  ASSESSMENT: Patient currently experiencing Adjustment disorder with depressed mood.   Patient may benefit from psychoeducation and brief  therapeutic interventions regarding coping with symptoms of depression and irritability   PLAN: 1. Follow up with behavioral health clinician on : As requested by pt 2. Behavioral recommendations:  -Per medical provider, take Ibuprofen as prescribed for pain -Consider [facility] [place] [facility] [place] walk-in clinics for [facility] treatment, if needed in the future 3. Referral(s): Integrated Behavioral Health Services (In Clinic) 4. "From scale of 1-10, how likely are you to follow plan?": -  Mary Lips, LCSW     Depression screen [facility] 2/9 06/11/2017 05/06/2017 04/28/2017 04/11/2017 03/10/2017  Decreased Interest 0 0 1 0 0  Down, Depressed, Hopeless 0 0 0 0 0  PHQ - 2 Score 0 0 1 0 0  Altered sleeping 0 1 1 0 1  Tired, decreased energy 0 1 1 0 1  Change in appetite 0 0 0 0 1  Feeling bad or failure about yourself  0 0 0 0 0  Trouble concentrating 0 0 0 0 0  Moving slowly or fidgety/restless 0 0 0 0 0  Suicidal thoughts 0 0 0 0 0  PHQ-9 Score 0 2 3 0 3   GAD 7 : Generalized Anxiety Score 06/11/2017 05/06/2017 04/28/2017 04/11/2017  Nervous, Anxious, on Edge 0 0 0 1  Control/stop worrying 0 0 0 0  Worry too much - different things 0 0 0 0  Trouble relaxing 0 0 0 0  Restless 0 0 0 0  Easily annoyed or irritable 0 0 0 0  Afraid - awful might happen 0 0 0 0  Total GAD 7 Score 0 0 0 1

## 2017-06-11 ENCOUNTER — Ambulatory Visit (INDEPENDENT_AMBULATORY_CARE_PROVIDER_SITE_OTHER): Payer: Medicaid Other | Admitting: Clinical

## 2017-06-11 DIAGNOSIS — F4321 Adjustment disorder with depressed mood: Secondary | ICD-10-CM | POA: Diagnosis not present

## 2017-06-16 ENCOUNTER — Telehealth: Payer: Self-pay | Admitting: General Practice

## 2017-06-16 NOTE — Telephone Encounter (Signed)
Lillia AbedLindsay from [facility] called and left message on nurse voicemail line stating the patient had orders for skilled nursing care starting 5/22 & 5/24 and the papers were faxed over on 5/27. She is calling requesting these be signed and faxed back to [phone] Adora FridgeTN Lindsay R.

## 2017-06-17 ENCOUNTER — Telehealth: Payer: Self-pay

## 2017-06-17 NOTE — Telephone Encounter (Signed)
Noreene LarssonJill with [facility] called with some concerns regarding patients wound vac.

## 2017-06-18 ENCOUNTER — Ambulatory Visit (INDEPENDENT_AMBULATORY_CARE_PROVIDER_SITE_OTHER): Payer: Medicaid Other | Admitting: Obstetrics and Gynecology

## 2017-06-18 ENCOUNTER — Encounter: Payer: Self-pay | Admitting: Obstetrics and Gynecology

## 2017-06-18 VITALS — BP 112/74 | HR 62 | Ht 62.0 in | Wt 212.5 lb

## 2017-06-18 DIAGNOSIS — T8149XA Infection following a procedure, other surgical site, initial encounter: Secondary | ICD-10-CM

## 2017-06-18 DIAGNOSIS — Z1389 Encounter for screening for other disorder: Secondary | ICD-10-CM

## 2017-06-18 DIAGNOSIS — O0990 Supervision of high risk pregnancy, unspecified, unspecified trimester: Secondary | ICD-10-CM

## 2017-06-18 DIAGNOSIS — Z3202 Encounter for pregnancy test, result negative: Secondary | ICD-10-CM | POA: Diagnosis not present

## 2017-06-18 DIAGNOSIS — O24410 Gestational diabetes mellitus in pregnancy, diet controlled: Secondary | ICD-10-CM

## 2017-06-18 LAB — POCT PREGNANCY, URINE: Preg Test, Ur: NEGATIVE

## 2017-06-18 MED ORDER — CEPHALEXIN 500 MG PO CAPS: 500.0000 mg | ORAL_CAPSULE | Freq: Three times a day (TID) | ORAL | 0 refills | Status: DC

## 2017-06-18 NOTE — Telephone Encounter (Signed)
Pt was in office today for visit w/Dr. Earlene Plateravis and wound issues were addressed.

## 2017-06-18 NOTE — Progress Notes (Signed)
Obstetrics/Postpartum Visit  Appointment Date: 06/18/2017  OBGYN Clinic: [facility]  Primary Care Provider: Patient, No Pcp Per  Chief Complaint:  Chief Complaint  Patient presents with  . Postpartum Care    History of Present Illness: Mary Davies is a 27 y.o. Caucasian G2P1011 (No LMP recorded.), seen for the above chief complaint. Her past medical history is significant for gDM   She is s/p 1LTCS on 05/08/17 at 39 weeks; she was discharged to home on POD#2. Pregnancy complicated by gDMA1. Post partum course complicated by post op infection at incision site, requiring wound vac. Now having home dressing changes every 3 days, s/p keflex for cellulitis at wound site.  Complains of soreness around incision site.  Vaginal bleeding or discharge: No  Breast or formula feeding: breast Intercourse: No  Contraception: would like IUD but declines today due to pain at incision site PP depression s/s: No  Any bowel or bladder issues: No  Pap smear: no abnormalities (date: 2017)  Review of Systems: Positive for n/a.   Her 12 point review of systems is negative or as noted in the History of Present Illness.  Patient Active Problem List   Diagnosis Date Noted  . Postoperative wound cellulitis 06/02/2017  . Status post primary low transverse cesarean section 05/10/2017  . Encounter for induction of labor 05/07/2017  . GDM (gestational diabetes mellitus), class A1   . [redacted] weeks gestation of pregnancy   . Polyhydramnios affecting pregnancy 04/28/2017  . Gestational diabetes mellitus (GDM) affecting pregnancy 12/30/2016  . Asthma 10/30/2016  . Rh negative status during pregnancy 10/30/2016  . Supervision of high-risk pregnancy 10/23/2016  . Tobacco abuse 07/14/2012    Medications Mary Davies had no medications administered during this visit. Current Outpatient Medications  Medication Sig Dispense Refill  . ferrous sulfate 325 (65 FE) MG tablet Take 1 tablet (325 mg total) by  mouth 2 (two) times daily with a meal. 60 tablet 0  . cephALEXin (KEFLEX) 500 MG capsule Take 1 capsule (500 mg total) by mouth 3 (three) times daily. 21 capsule 0  . ibuprofen (ADVIL,MOTRIN) 600 MG tablet Take 1 tablet (600 mg total) by mouth every 6 (six) hours. (Patient not taking: Reported on 06/18/2017) 30 tablet 0  . oxyCODONE-acetaminophen (PERCOCET) 10-325 MG tablet Take 1 tablet by mouth every 4 (four) hours as needed for pain. 15 tablet 0  . Prenatal Multivit-Min-Fe-FA (PRENATAL VITAMINS) 0.8 MG tablet Take 1 tablet by mouth daily. (Patient not taking: Reported on 06/18/2017) 30 tablet 12   No current facility-administered medications for this visit.     Allergies Pineapple  Physical Exam:  BP 112/74   Pulse 62   Ht 5\' 2"  (1.575 m)   Wt 212 lb 8 oz (96.4 kg)   Breastfeeding? No   BMI 38.87 kg/m  Body mass index is 38.87 kg/m. General appearance: Well nourished, well developed female in no acute distress.  Cardiovascular: regular rate and rhythm Respiratory: Normal respiratory effort Abdomen:  no masses, hernias; diffusely non tender to palpation, non distended, pfannenstiel incision with area approx 7 cm still open, appears well perfused with no active bleeding, incision healing by secondary intention, slight erythema at skin border, no pus Breasts: breasts appear normal, no suspicious masses, no skin or nipple changes or axillary nodes, not examined. Neuro/Psych:  Normal mood and affect.  Skin:  Warm and dry.    PP Depression Screening:  negative  Assessment: Patient is a 27 y.o. G2P1011 who is 6 weeks post  partum from a primary c-section with course complicated by wound infection. She is doing well, still with open wound healing by secondary intention with wet to dry changes. Wound appears to be healing well with pink tissue, slight redness at area around incision so have restarted her on kelfex x 1 week. She declines IUD placement today.   Plan:   1. Supervision of high  risk pregnancy, antepartum No issues Declines IUD placement today, reviewed importance of no intercourse until she can have it placed  2. GDM (gestational diabetes mellitus), class A1 Neg 2 hr GTT post partum  3. Postoperative wound cellulitis Appears to be heavling well Cont home wound care Keflex x 7 days sent for possible cellulitis at skin edge   RTC 2 weeks for wound check and IUD placement   K. Therese SarahMeryl Trulee Hamstra, M.D. Attending Obstetrician & Gynecologist, [facility] Center for [facility], [facility]

## 2017-06-20 NOTE — Telephone Encounter (Signed)
[facility] Mary Davies [facility] and left message stating we are calling to see if they still need assistance or have questions about the patient's care. If so, please call us back and let us know. Per chart review, patient had pp visit on 6/5.

## 2017-06-24 ENCOUNTER — Telehealth: Payer: Self-pay | Admitting: General Practice

## 2017-06-24 NOTE — Telephone Encounter (Signed)
Noreene LarssonJill from [facility] called and left a message on our nurse voicemail line stating they need some new orders for the patient's wound care. Mary Mayoalled Jill and reviewed office visit note from patient's appt last week. She verbalized understanding and will teach the patient wet to dry dressing changes and they will check on the patient once a week.

## 2017-07-07 ENCOUNTER — Ambulatory Visit (INDEPENDENT_AMBULATORY_CARE_PROVIDER_SITE_OTHER): Payer: Medicaid Other | Admitting: Obstetrics and Gynecology

## 2017-07-07 ENCOUNTER — Encounter: Payer: Self-pay | Admitting: Obstetrics and Gynecology

## 2017-07-07 VITALS — BP 124/85 | HR 64 | Ht 62.0 in | Wt 210.0 lb

## 2017-07-07 DIAGNOSIS — Z308 Encounter for other contraceptive management: Secondary | ICD-10-CM

## 2017-07-07 DIAGNOSIS — Z30430 Encounter for insertion of intrauterine contraceptive device: Secondary | ICD-10-CM | POA: Diagnosis present

## 2017-07-07 LAB — POCT PREGNANCY, URINE: Preg Test, Ur: NEGATIVE

## 2017-07-07 MED ORDER — LEVONORGESTREL 19.5 MCG/DAY IU IUD
INTRAUTERINE_SYSTEM | Freq: Once | INTRAUTERINE | Status: AC
  Administered 2017-07-07: 16:00:00 via INTRAUTERINE

## 2017-07-07 NOTE — Patient Instructions (Signed)

## 2017-07-07 NOTE — Progress Notes (Signed)
    GYNECOLOGY CLINIC PROCEDURE NOTE  [name] is a 27 y.o. G2P1011 here for [name] IUD insertion. No GYN concerns. UPT negative  IUD Insertion Procedure Note Patient identified, informed consent performed, consent signed.   Discussed risks of irregular bleeding, cramping, infection, malpositioning or misplacement of the IUD outside the uterus which may require further procedure such as laparoscopy. Time out was performed.  Urine pregnancy test negative.  Speculum placed in the vagina.  Cervix visualized.  Cleaned with Betadine x 2.  Grasped anteriorly with a single tooth tenaculum.  Uterus sounded to 8 cm.  Mary Davies  IUD placed per manufacturer's recommendations.  Strings trimmed to 3 cm. Tenaculum was removed, good hemostasis noted.  Patient tolerated procedure well.   Patient was given post-procedure instructions.  She was advised to have backup contraception for one week.  Patient was also asked to check IUD strings periodically and follow up in 4 weeks for IUD check.    Nettie ElmMichael Marielis Samara, MD, FACOG Attending Obstetrician & Gynecologist Center for [facility], [facility]

## 2017-07-07 NOTE — Progress Notes (Deleted)
Subjective:     Mary Davies is a 27 y.o. female who presents for a postpartum visit. She is 8 weeks postpartum following a low cervical transverse Cesarean section. I have fully reviewed the prenatal and intrapartum course. The delivery was at *** gestational weeks. Outcome: primary cesarean section, low transverse incision. Anesthesia: epidural. Postpartum course has been ***. Baby's course has been ***. Baby is feeding by bottle Rush Barer- Gerber Smoothe. Bleeding staining only. Bowel function is normal. Bladder function is normal. Patient is sexually active. Contraception method is condoms. Postpartum depression screening: negative.  {Common ambulatory SmartLinks:19316}  Review of Systems {ros; complete:30496}   Objective:    BP 124/85   Pulse 64   Ht 5\' 2"  (1.575 m)   Wt 210 lb (95.3 kg)   Breastfeeding? No   BMI 38.41 kg/m   General:  {gen appearance:16600}   Breasts:  {breast exam:1202::"inspection negative, no nipple discharge or bleeding, no masses or nodularity palpable"}  Lungs: {lung exam:16931}  Heart:  {heart exam:5510}  Abdomen: {abdomen exam:16834}   Vulva:  {labia exam:12198}  Vagina: {vagina exam:12200}  Cervix:  {cervix exam:14595}  Corpus: {uterus exam:12215}  Adnexa:  {adnexa exam:12223}  Rectal Exam: {rectal/vaginal exam:12274}        Assessment:    *** postpartum exam. Pap smear {done:10129} at today's visit.   Plan:    1. Contraception: {method:5051} 2. *** 3. Follow up in: {1-10:13787} {time; units:19136} or as needed.

## 2017-07-09 ENCOUNTER — Ambulatory Visit: Payer: Self-pay | Admitting: Medical

## 2017-09-02 ENCOUNTER — Emergency Department (HOSPITAL_COMMUNITY)
Admission: EM | Admit: 2017-09-02 | Discharge: 2017-09-02 | Disposition: A | Discharge status: Home / Self Care | Payer: Medicaid Other | Attending: Emergency Medicine | Admitting: Emergency Medicine

## 2017-09-02 ENCOUNTER — Other Ambulatory Visit: Payer: Self-pay

## 2017-09-02 ENCOUNTER — Encounter (HOSPITAL_COMMUNITY): Payer: Self-pay | Admitting: *Deleted

## 2017-09-02 DIAGNOSIS — N644 Mastodynia: Secondary | ICD-10-CM | POA: Insufficient documentation

## 2017-09-02 DIAGNOSIS — Z5321 Procedure and treatment not carried out due to patient leaving prior to being seen by health care provider: Secondary | ICD-10-CM | POA: Insufficient documentation

## 2017-09-02 LAB — BASIC METABOLIC PANEL
ANION GAP: 8 (ref 5–15)
BUN: 8 mg/dL (ref 6–20)
CHLORIDE: 107 mmol/L (ref 98–111)
CO2: 25 mmol/L (ref 22–32)
CREATININE: 0.73 mg/dL (ref 0.44–1.00)
Calcium: 8.9 mg/dL (ref 8.9–10.3)
GFR calc non Af Amer: >60 mL/min (ref >60)
Glucose, Bld: 108 mg/dL — ABNORMAL HIGH (ref 70–99)
Potassium: 3.6 mmol/L (ref 3.5–5.1)
Sodium: 140 mmol/L (ref 135–145)

## 2017-09-02 LAB — CBC
HEMATOCRIT: 42.8 % (ref 36.0–46.0)
HEMOGLOBIN: 14.1 g/dL (ref 12.0–15.0)
MCH: 31.1 pg (ref 26.0–34.0)
MCHC: 32.9 g/dL (ref 30.0–36.0)
MCV: 94.5 fL (ref 78.0–100.0)
Platelets: 228 10*3/uL (ref 150–400)
RBC: 4.53 MIL/uL (ref 3.87–5.11)
RDW: 13.3 % (ref 11.5–15.5)
WBC: 8.9 10*3/uL (ref 4.0–10.5)

## 2017-09-02 NOTE — ED Triage Notes (Signed)
Pt in c/o mass in her right breast, states she first noticed it while pregnant several months ago, and in the last few days area has become more painful, sore to touch, states she tried to open the area up and noticed a discharge from her nipple.

## 2017-09-02 NOTE — ED Notes (Signed)
Pt eloped from room, prior to being seen by provider. Did not inform any staff member of her departure. No disposition, AVS papers or prescriptions available.

## 2017-09-02 NOTE — ED Notes (Signed)
Palm size red spot to rt breast, states has been there for a year or more went away and she mashed it rfecently and stuff came out her nipples now has reddened area to braest , not breast feeding

## 2017-09-04 ENCOUNTER — Emergency Department (HOSPITAL_COMMUNITY)
Admission: EM | Admit: 2017-09-04 | Discharge: 2017-09-04 | Disposition: A | Discharge status: Home / Self Care | Payer: Medicaid Other | Attending: Emergency Medicine | Admitting: Emergency Medicine

## 2017-09-04 ENCOUNTER — Encounter (HOSPITAL_COMMUNITY): Payer: Self-pay

## 2017-09-04 ENCOUNTER — Other Ambulatory Visit: Payer: Self-pay

## 2017-09-04 DIAGNOSIS — J45909 Unspecified asthma, uncomplicated: Secondary | ICD-10-CM | POA: Diagnosis not present

## 2017-09-04 DIAGNOSIS — F17210 Nicotine dependence, cigarettes, uncomplicated: Secondary | ICD-10-CM | POA: Insufficient documentation

## 2017-09-04 DIAGNOSIS — N611 Abscess of the breast and nipple: Secondary | ICD-10-CM | POA: Insufficient documentation

## 2017-09-04 MED ORDER — AMOXICILLIN-POT CLAVULANATE 875-125 MG PO TABS: 1.0000 | ORAL_TABLET | Freq: Two times a day (BID) | ORAL | 0 refills | Status: AC

## 2017-09-04 MED ORDER — AMOXICILLIN-POT CLAVULANATE 875-125 MG PO TABS
1.0000 | ORAL_TABLET | Freq: Once | ORAL | Status: AC
  Administered 2017-09-04: 1 via ORAL
  Filled 2017-09-04: qty 1

## 2017-09-04 NOTE — ED Triage Notes (Signed)
Pt endorses redness to right breast with pain, seen here Tuesday and told she had mastitis but patient left. Blood work was completed. Afebrile, VSS

## 2017-09-04 NOTE — ED Provider Notes (Signed)
[facility] Provider Note   CSN: [id number] Arrival date & time: 09/04/17  1016     History   Chief Complaint Chief Complaint  Patient presents with  . Breast Problem    HPI [name] is a 27 y.o. female presenting for swelling and tenderness to the right breast.  Patient states that she first noticed a knot in her right breast approximately 1 year ago, she states that 4 days ago it began increasing in size and tenderness.  She endorses a sharp/throbbing 8/10 pain that is worse with palpation.  Patient states that she has pressed on her breast and exuded pus from her nipple.  Patient states that she has taken to 500 mg pills of Keflex that she had leftover from a previous infection. Patient denies history of fevers.  Patient denies breast-feeding.  HPI  Past Medical History:  Diagnosis Date  . Asthma    prn inhaler  . Closed left radial fracture 01/15/2011  . Depression    hx of, fine now  . Fracture of metatarsal bone of left foot 04/22/2015   5th metatarsal  . Left breast abscess 04/2015    Patient Active Problem List   Diagnosis Date Noted  . Postoperative wound cellulitis 06/02/2017  . Status post primary low transverse cesarean section 05/10/2017  . Encounter for induction of labor 05/07/2017  . GDM (gestational diabetes mellitus), class A1   . [redacted] weeks gestation of pregnancy   . Polyhydramnios affecting pregnancy 04/28/2017  . Gestational diabetes mellitus (GDM) affecting pregnancy 12/30/2016  . Asthma 10/30/2016  . Rh negative status during pregnancy 10/30/2016  . Supervision of high-risk pregnancy 10/23/2016  . Tobacco abuse 07/14/2012    Past Surgical History:  Procedure Laterality Date  . CESAREAN SECTION N/A 05/08/2017   Procedure: CESAREAN SECTION;  Surgeon: Tilda BurrowFerguson, John V, MD;  [facility];  Service: Obstetrics;  Laterality: N/A;  . INCISION AND DRAINAGE ABSCESS Left 09/24/2012   Procedure:  INCISION AND DRAINAGE BREAST ABSCESS;  Surgeon: Robyne AskewPaul S Toth III, MD;  Location: [facility] OR;  Service: General;  Laterality: Left;  . INCISION AND DRAINAGE ABSCESS Left 05/08/2015   Procedure: INCISION AND DRAINAGE LEFT BREAST  ABSCESS;  Surgeon: Chevis PrettyPaul Toth III, MD;  Location: [facility];  Service: General;  Laterality: Left;     OB History    Gravida  2   Para  1   Term  1   Preterm      AB  1   Living  1     SAB  1   TAB      Ectopic      Multiple  0   Live Births  1            Home Medications    Prior to Admission medications   Medication Sig Start Date End Date Taking? Authorizing Provider  amoxicillin-clavulanate (AUGMENTIN) 875-125 MG tablet Take 1 tablet by mouth every 12 (twelve) hours for 10 days. 09/04/17 09/14/17  Harlene SaltsMorelli, Bethan Adamek A, PA-C  ferrous sulfate 325 (65 FE) MG tablet Take 1 tablet (325 mg total) by mouth 2 (two) times daily with a meal. Patient not taking: Reported on 07/07/2017 06/04/17   Constant, Peggy, MD    Family History Family History  Problem Relation Age of Onset  . Diabetes Mother   . Asthma Father     Social History Social History   Tobacco Use  . Smoking status: Current Every Day  Smoker    Packs/day: 0.25    Years: 10.00    Pack years: 2.50    Types: Cigarettes  . Smokeless tobacco: Never Used  . Tobacco comment: trying to quit  Substance Use Topics  . Alcohol use: No  . Drug use: No     Allergies   Pineapple   Review of Systems Review of Systems  Constitutional: Negative.  Negative for chills, fatigue and fever.  Gastrointestinal: Negative.  Negative for abdominal pain, diarrhea, nausea and vomiting.  Musculoskeletal: Negative.  Negative for arthralgias and myalgias.  Skin: Positive for color change. Negative for wound.     Physical Exam Updated Vital Signs BP 123/85 (BP Location: Right Arm)   Pulse 76   Temp 98.1 F (36.7 C) (Oral)   Resp 16   Ht 5\' 2"  (1.575 m)   Wt 95.3 kg   SpO2 100%    Breastfeeding? No   BMI 38.41 kg/m   Physical Exam  Constitutional: She is oriented to person, place, and time. She appears well-developed and well-nourished. No distress.  HENT:  Head: Normocephalic and atraumatic.  Right Ear: External ear normal.  Left Ear: External ear normal.  Nose: Nose normal.  Eyes: Pupils are equal, round, and reactive to light. EOM are normal.  Neck: Trachea normal and normal range of motion. No tracheal deviation present.  Pulmonary/Chest: Effort normal. No respiratory distress. Right breast exhibits inverted nipple, skin change and tenderness. Left breast exhibits no inverted nipple.    Abdominal: Soft. There is no tenderness. There is no rebound and no guarding.  Musculoskeletal: Normal range of motion.  Neurological: She is alert and oriented to person, place, and time.  Skin: Skin is warm and dry. There is erythema.  Patient with 6 cm x 7 cm area of erythema to the medial right breast along the areola.  There is a 4 cm x 4 cm area of induration along the areola from approximately 1:00 to 6:00 of the right nipple.  Acutely tender to palpation.  Breast Examination chaperoned by Conley Canal. US breast examination chaperoned by Waunita Schooner.  Psychiatric: She has a normal mood and affect. Her behavior is normal.         ED Treatments / Results  Labs (all labs ordered are listed, but only abnormal results are displayed) Labs Reviewed - No data to display  EKG None  Radiology No results found.  Procedures Procedures (including critical care time)  Medications Ordered in ED Medications  amoxicillin-clavulanate (AUGMENTIN) 875-125 MG per tablet 1 tablet (1 tablet Oral Given 09/04/17 1422)     Initial Impression / Assessment and Plan / ED Course  I have reviewed the triage vital signs and the nursing notes.  Pertinent labs & imaging results that were available during my care of the patient were reviewed by me and considered in my medical  decision making (see chart for details).  Clinical Course as of Sep 05 1802  Thu Sep 04, 2017  1352 Consulted with CCS who advised that the patient be started on antibiotics and follow-up with the breast center for further evaluation.   [BM]    Clinical Course User Index [BM] Bill Salinas, PA-C   Patient presenting with swelling and tenderness to the medial aspect of the right breast.  Abscess noted on ultrasound imaging.  Patient is afebrile, not tachycardic, denies nausea/vomiting, denies any nausea or pain.  Patient is well-appearing.  Consult with Beacher May w/ [facility] surgery who advised the patient  be started on antibiotics and follow-up with the breast clinic for further evaluation.  Patient given referral to breast clinic, assisted by nursing staff with scheduling this appointment, due to patient's lack of current primary care provider may have involved social work to set patient up with her primary care provider so she is able to go see the breast clinic.  Patient given first dose of Augmentin here in department.  Patient has been set up with appoint with Dr. Elroy Channel OB/GYN for follow-up appointment.  At this time there does not appear to be any evidence of an acute emergency medical condition and the patient appears stable for discharge with appropriate outpatient follow up. Diagnosis was discussed with patient who verbalizes understanding of care plan and is agreeable to discharge. I have discussed return precautions with patient who verbalizes understanding of return precautions. Patient strongly encouraged to follow-up with their PCP. All questions answered.  Patient's case discussed with Dr. Rush Landmark who agrees with plan to discharge with Augmentin and breast clinic follow-up.     Note: Portions of this report may have been transcribed using voice recognition software. Every effort was made to ensure accuracy; however, inadvertent computerized transcription errors  may still be present.  Final Clinical Impressions(s) / ED Diagnoses   Final diagnoses:  Breast abscess    ED Discharge Orders         Ordered    amoxicillin-clavulanate (AUGMENTIN) 875-125 MG tablet  Every 12 hours     09/04/17 1402           Elizabeth Palau 09/04/17 1805    Tegeler, Canary Brim, MD 09/04/17 2028

## 2017-09-04 NOTE — ED Notes (Signed)
Spoke to dr Elroy ChannelIrvin OB on call and I gave him pts name and MRN , hestates he will make referral to breast center for US and possible asp I thanked him pt is clear about calling the womens clinic if she doesn't hear back b 10 am tom

## 2017-09-04 NOTE — Discharge Instructions (Addendum)
Please return to the Emergency Department for any new or worsening symptoms or if your symptoms do not improve. Please be sure to follow up with your Primary Care Physician as soon as possible regarding your visit today. If you do not have a Primary Doctor please use the resources below to establish one. You MUST follow-up with the breast center as soon as possible!. Please take all of your antibiotic Augmentin as prescribed. Please also follow-up with your OB/GYN regarding your visit today.   Contact a health care provider if: You have pus-like discharge from the breast. Your symptoms do not improve with the treatment prescribed by your health care provider within 2 days. Get help right away if: Your pain and swelling are getting worse. You have pain that is not controlled with medicine. You have a red line extending from the breast toward your armpit. You have a fever or persistent symptoms for more than 2-3 days. You have a fever and your symptoms suddenly get worse. Contact a health care provider if: You have more redness, swelling, or pain around your abscess. You have more fluid or blood coming from your abscess. Your abscess feels warm to the touch. You have more pus or a bad smell coming from your abscess. You have a fever. You have muscle aches. You have chills or a general ill feeling. Get help right away if: You have severe pain. You see red streaks on your skin spreading away from the abscess.  RESOURCE GUIDE  Chronic Pain Problems: Contact [facility]  [phone] Patients need to be referred by their primary care doctor.  Insufficient Money for Medicine: Contact United Way:  call "211" or [facility] [phone].  No Primary Care Doctor: Call Health Connect  [phone] - can help you locate a primary care doctor that  accepts your insurance, provides certain services, etc. Physician Referral Service- [phone]  Agencies that provide  inexpensive medical care: [facility]  [phone] [facility]  [phone] [facility]  [phone] [facility]  [phone] Planned Parenthood  [phone] [facility]  [phone]  Medicaid-accepting [facility] Providers: [facility]- [address], [address]  [phone], Mon-Fri 9am-7pm, Sat 9am-1pm [facility]- [address], [address] [place]  [phone] [facility]- [address], [address] [place]  [phone] [facility]- [address]  [phone] Renaye RakersVeita Bland- [address], [address], [phone]  Only accepts [place] patients after they have their name  applied to their card  Self Pay (no insurance) in [facility]: Sickle Cell Patients: Dr Willey BladeEric Dean, [facility]  [address], [phone] [facility]- [address]  [phone]       Redge Gainer-     [facility] [place]- [address], [address]       -     [facility]- see information above (Speak to CitigroupPam H if you do not have insurance)       -  [facility]- [address], [phone]       -  [facility]- [address],  [phone]       -  [facility]- [address] [place] [address], [phone]       -  Dr Julio Sickssei-Bonsu-  [address], [address], [place], [phone]       -  [facility]- [address] [phone]       -  [facility]- [address], [place], also [address] [phone]       -    [facility]- [address], [place], 1st & 3rd Saturday   every month, 10am-1pm  1) Find a Doctor and Pay Out of Pocket Although you won't have to find out who is covered by your insurance plan, it is a good idea to ask around and get recommendations. You will then need to call the office and see if the doctor you have chosen will accept you as a new  patient and what types of options they offer for patients who are self-pay. Some doctors offer discounts or will set up payment plans for their patients who do not have insurance, but you will need to ask so you aren't surprised when you get to your appointment.  2) Contact Your Local Health Department Not all health departments have doctors that can see patients for sick visits, but many do, so it is worth a call to see if yours does. If you don't know where your local health department is, you can check in your phone book. The CDC also has a tool to help you locate your state's health department, and many state websites also have listings of all of their local health departments.  3) Find a [facility] If your illness is not likely to be very severe or complicated, you may want to try a walk in clinic. These are popping up all over the country in pharmacies, drugstores, and shopping centers. They're usually staffed by nurse practitioners or physician assistants that have been trained to treat common illnesses and complaints. They're usually fairly quick and inexpensive. However, if you have serious medical issues or chronic medical problems, these are probably not your best option  [facility] [place], [facility], [address] [place], phone [phone] or [phone].  Monday - Friday, call for an appointment. [place], [facility], [address], [place], phone [phone] or [phone].  Monday - Friday, call for an appointment.  Abuse/Neglect: [place] [phone] [place] [phone] (After Hours)  Emergency Shelter:  [facility] [phone]  Maternity Homes: [facility] [place] [phone] [place] [phone]  MRSA Hotline #:   581-022-3502  [facility] Dept. [address]         [place] Phone:  [phone]  Phone:  [phone]                   Phone:  [place], [place]- [phone]       -     [facility] in [place], [address],                                  [place] [phone] or [phone] (After Hours)   [place]  Substance Abuse Resources: Alcohol and Drug Services  [phone] [place] [phone] [facility] [place] Chinita Pester [phone] Residential & Outpatient Substance Abuse Program  [phone]  Psychological Services: [place]  [phone] [place]  [place], [place]. [address], [place], [place]: [phone] or [phone], PicCapture.uy  Dental Assistance  If unable to pay or uninsured, contact:  [facility] or [facility]. to become qualified for the adult dental clinic.  Patients with Medicaid: [facility] [phone] [address], [place] [address], [phone]  If unable to pay, or uninsured, contact [facility] [phone]) or [place] [phone] in [place], [place] in [facility]) to become qualified for the adult dental clinic   Other [place]- [place], [place], [place], [address], [place], [place], 2nd and 4th Thursday of the month at 6:30am.  10 clients each day by appointment, can sometimes  see walk-in patients if someone does not show for an appointment. [facility]- [address] Hillard Danker [place], [place], [address], [place], [place], [place], [address], [place] Department- [phone] [facility] [phone]

## 2017-09-04 NOTE — ED Notes (Signed)
EPD at bedside 

## 2017-09-08 ENCOUNTER — Telehealth: Payer: Self-pay | Admitting: General Practice

## 2017-09-08 DIAGNOSIS — N61 Mastitis without abscess: Secondary | ICD-10-CM

## 2017-09-08 NOTE — Telephone Encounter (Signed)
Scheduled ultrasound of the right [facility] for [facility] @ 1210pm per Dr Alysia PennaErvin. Called patient, no answer- left message stating we are trying to reach you regarding an upcoming appt this week, please call us back for additional information.

## 2017-09-08 NOTE — Telephone Encounter (Signed)
Called patient & informed her of appt. Patient verbalized understanding & had no questions.

## 2017-09-09 ENCOUNTER — Telehealth: Payer: Self-pay | Admitting: Obstetrics and Gynecology

## 2017-09-09 NOTE — Telephone Encounter (Signed)
Called and left message on patient's voicemail stating that we have rescheduled her 8/30 appt to 9/5 @ 10:35 due to the provider not being available.

## 2017-09-10 ENCOUNTER — Ambulatory Visit
Admission: RE | Admit: 2017-09-10 | Discharge: 2017-09-10 | Disposition: A | Discharge status: Home / Self Care | Payer: Medicaid Other | Source: Ambulatory Visit | Attending: Obstetrics and Gynecology | Admitting: Obstetrics and Gynecology

## 2017-09-10 ENCOUNTER — Other Ambulatory Visit: Payer: Self-pay | Admitting: Obstetrics and Gynecology

## 2017-09-10 DIAGNOSIS — L0291 Cutaneous abscess, unspecified: Secondary | ICD-10-CM

## 2017-09-10 DIAGNOSIS — N61 Mastitis without abscess: Secondary | ICD-10-CM

## 2017-09-11 ENCOUNTER — Other Ambulatory Visit: Payer: Self-pay | Admitting: Obstetrics and Gynecology

## 2017-09-11 ENCOUNTER — Ambulatory Visit
Admission: RE | Admit: 2017-09-11 | Discharge: 2017-09-11 | Disposition: A | Discharge status: Home / Self Care | Payer: Medicaid Other | Source: Ambulatory Visit | Attending: Obstetrics and Gynecology | Admitting: Obstetrics and Gynecology

## 2017-09-11 DIAGNOSIS — L0291 Cutaneous abscess, unspecified: Secondary | ICD-10-CM

## 2017-09-12 ENCOUNTER — Ambulatory Visit: Payer: Self-pay | Admitting: Obstetrics and Gynecology

## 2017-09-16 LAB — AEROBIC/ANAEROBIC CULTURE (SURGICAL/DEEP WOUND)

## 2017-09-16 LAB — AEROBIC/ANAEROBIC CULTURE W GRAM STAIN (SURGICAL/DEEP WOUND)

## 2017-09-18 ENCOUNTER — Encounter: Payer: Self-pay | Admitting: Obstetrics and Gynecology

## 2017-09-18 ENCOUNTER — Ambulatory Visit: Payer: Self-pay | Admitting: Obstetrics and Gynecology

## 2017-09-19 ENCOUNTER — Inpatient Hospital Stay: Admission: RE | Admit: 2017-09-19 | Payer: Medicaid Other | Source: Ambulatory Visit

## 2017-09-19 ENCOUNTER — Other Ambulatory Visit: Payer: Medicaid Other

## 2017-09-19 ENCOUNTER — Encounter: Payer: Self-pay | Admitting: *Deleted

## 2017-09-23 ENCOUNTER — Encounter: Payer: Self-pay | Admitting: *Deleted

## 2017-10-13 ENCOUNTER — Encounter: Payer: Self-pay | Admitting: *Deleted

## 2017-10-14 ENCOUNTER — Inpatient Hospital Stay: Admission: RE | Admit: 2017-10-14 | Payer: Medicaid Other | Source: Ambulatory Visit

## 2017-10-20 ENCOUNTER — Ambulatory Visit
Admission: RE | Admit: 2017-10-20 | Discharge: 2017-10-20 | Disposition: A | Discharge status: Home / Self Care | Payer: Medicaid Other | Source: Ambulatory Visit | Attending: Obstetrics and Gynecology | Admitting: Obstetrics and Gynecology

## 2017-10-20 DIAGNOSIS — L0291 Cutaneous abscess, unspecified: Secondary | ICD-10-CM

## 2017-10-27 ENCOUNTER — Other Ambulatory Visit: Payer: Medicaid Other

## 2017-11-13 ENCOUNTER — Telehealth: Payer: Self-pay | Admitting: Obstetrics and Gynecology

## 2017-11-13 ENCOUNTER — Ambulatory Visit: Payer: Medicaid Other | Admitting: Obstetrics and Gynecology

## 2017-11-13 NOTE — Telephone Encounter (Signed)
Called pt to reschedule her appt today 10/31 @ 3:35. Has been rescheduled for  11/6 @ 10:35

## 2017-11-19 ENCOUNTER — Ambulatory Visit: Payer: Medicaid Other | Admitting: Nurse Practitioner

## 2018-07-03 ENCOUNTER — Other Ambulatory Visit: Payer: Self-pay

## 2018-07-03 ENCOUNTER — Encounter (HOSPITAL_COMMUNITY): Payer: Self-pay | Admitting: Emergency Medicine

## 2018-07-03 ENCOUNTER — Emergency Department (HOSPITAL_COMMUNITY)
Admission: EM | Admit: 2018-07-03 | Discharge: 2018-07-03 | Disposition: A | Discharge status: Home / Self Care | Payer: Medicaid Other | Attending: Emergency Medicine | Admitting: Emergency Medicine

## 2018-07-03 DIAGNOSIS — K0889 Other specified disorders of teeth and supporting structures: Secondary | ICD-10-CM

## 2018-07-03 DIAGNOSIS — F17210 Nicotine dependence, cigarettes, uncomplicated: Secondary | ICD-10-CM | POA: Insufficient documentation

## 2018-07-03 DIAGNOSIS — K029 Dental caries, unspecified: Secondary | ICD-10-CM | POA: Diagnosis not present

## 2018-07-03 DIAGNOSIS — J45909 Unspecified asthma, uncomplicated: Secondary | ICD-10-CM | POA: Diagnosis not present

## 2018-07-03 MED ORDER — OXYCODONE-ACETAMINOPHEN 5-325 MG PO TABS
1.0000 | ORAL_TABLET | ORAL | Status: DC | PRN
  Administered 2018-07-03: 1 via ORAL
  Filled 2018-07-03: qty 1

## 2018-07-03 MED ORDER — IBUPROFEN 200 MG PO TABS
400.0000 mg | ORAL_TABLET | Freq: Once | ORAL | Status: AC | PRN
  Administered 2018-07-03: 400 mg via ORAL
  Filled 2018-07-03: qty 2

## 2018-07-03 MED ORDER — CHLORHEXIDINE GLUCONATE 0.12 % MT SOLN: 15.0000 mL | Freq: Two times a day (BID) | OROMUCOSAL | 0 refills | Status: DC

## 2018-07-03 MED ORDER — CLINDAMYCIN HCL 150 MG PO CAPS: 450.0000 mg | ORAL_CAPSULE | Freq: Three times a day (TID) | ORAL | 0 refills | Status: AC

## 2018-07-03 NOTE — ED Triage Notes (Signed)
Patient here from home via EMS with complaints of right lower dental pain that started 69min ago. Reports that last time this happened the tooth had to be pulled.

## 2018-07-03 NOTE — Discharge Instructions (Addendum)
You have been diagnosed today with dental pain due to dental cavities.  At this time there does not appear to be the presence of an emergent medical condition, however there is always the potential for conditions to change. Please read and follow the below instructions.  Please return to the Emergency Department immediately for any new or worsening symptoms. Please be sure to follow up with your Primary Care Provider within one week regarding your visit today; please call their office to schedule an appointment even if you are feeling better for a follow-up visit. Please take the antibiotic clindamycin as prescribed to treat possible dental infections.  Please call the dentist on your resource guide today to establish further dental care.  Call their offices today to schedule an appointment.  You may use the Peridex mouth rinse as prescribed to help with your symptoms, do not swallow Peridex, rinse and spit. You may continue using over-the-counter anti-inflammatories such as Tylenol and ibuprofen as directed on the packaging to help with your symptoms.  Get help right away if: You cannot open your mouth. You are having trouble breathing or swallowing. You have a fever. Your face, neck, or jaw is swollen. You have any new/concerning or worsening symptoms  Please read the additional information packets attached to your discharge summary.  Do not take your medicine if  develop an itchy rash, swelling in your mouth or lips, or difficulty breathing; call 911 and seek immediate emergency medical attention if this occurs.

## 2018-07-03 NOTE — ED Provider Notes (Signed)
[facility]-EMERGENCY DEPT Provider Note   CSN: [id number] Arrival date & time: 07/03/18  0545    History   Chief Complaint Chief Complaint  Patient presents with  . Dental Pain    HPI Mary Davies is a 28 y.o. female presented today via EMS for dental pain.  Patient reports that she woke up at 5 AM this morning with left lower dental pain she describes as a severe throbbing pain constant worsened with chewing on the left side and without alleviating factors, no medication attempted prior to arrival.  Patient reports similar pain in the past necessitating tooth removal by dentist.  She denies any swelling or drainage, denies difficulty swallowing or difficulty breathing.  Patient reports that she has "holes" in her teeth causing her pain.  Of note patient denies chance of pregnancy today, states that she has a 30-year-old child at home and has an IUD in place, she denies breast-feeding.  She does not want pregnancy test today.    HPI  Past Medical History:  Diagnosis Date  . Asthma    prn inhaler  . Closed left radial fracture 01/15/2011  . Depression    hx of, fine now  . Fracture of metatarsal bone of left foot 04/22/2015   5th metatarsal  . Left breast abscess 04/2015    Patient Active Problem List   Diagnosis Date Noted  . Postoperative wound cellulitis 06/02/2017  . GDM (gestational diabetes mellitus), class A1   . Gestational diabetes mellitus (GDM) affecting pregnancy 12/30/2016  . Asthma 10/30/2016  . Rh negative status during pregnancy 10/30/2016  . Supervision of high-risk pregnancy 10/23/2016  . Tobacco abuse 07/14/2012    Past Surgical History:  Procedure Laterality Date  . CESAREAN SECTION N/A 05/08/2017   Procedure: CESAREAN SECTION;  Surgeon: Tilda BurrowFerguson, John V, MD;  [facility];  Service: Obstetrics;  Laterality: N/A;  . INCISION AND DRAINAGE ABSCESS Left 09/24/2012   Procedure: INCISION AND DRAINAGE BREAST ABSCESS;   Surgeon: Robyne AskewPaul S Toth III, MD;  Location: [facility] OR;  Service: General;  Laterality: Left;  . INCISION AND DRAINAGE ABSCESS Left 05/08/2015   Procedure: INCISION AND DRAINAGE LEFT BREAST  ABSCESS;  Surgeon: Chevis PrettyPaul Toth III, MD;  Location: [facility];  Service: General;  Laterality: Left;     OB History    Gravida  2   Para  1   Term  1   Preterm      AB  1   Living  1     SAB  1   TAB      Ectopic      Multiple  0   Live Births  1            Home Medications    Prior to Admission medications   Medication Sig Start Date End Date Taking? Authorizing Provider  chlorhexidine (PERIDEX) 0.12 % solution Use as directed 15 mLs in the mouth or throat 2 (two) times daily. Rinse and Spit. (Do NOT swallow) 07/03/18   Harlene SaltsMorelli, Brandon A, PA-C  clindamycin (CLEOCIN) 150 MG capsule Take 3 capsules (450 mg total) by mouth 3 (three) times daily for 7 days. 07/03/18 07/10/18  Harlene SaltsMorelli, Brandon A, PA-C  ferrous sulfate 325 (65 FE) MG tablet Take 1 tablet (325 mg total) by mouth 2 (two) times daily with a meal. Patient not taking: Reported on 07/07/2017 06/04/17   Constant, Gigi GinPeggy, MD    Family History Family History  Problem Relation Age  of Onset  . Diabetes Mother   . Asthma Father     Social History Social History   Tobacco Use  . Smoking status: Current Every Day Smoker    Packs/day: 0.25    Years: 10.00    Pack years: 2.50    Types: Cigarettes  . Smokeless tobacco: Never Used  . Tobacco comment: trying to quit  Substance Use Topics  . Alcohol use: No  . Drug use: No     Allergies   Pineapple   Review of Systems Review of Systems  Constitutional: Negative.  Negative for chills and fever.  HENT: Positive for dental problem. Negative for drooling, facial swelling, sore throat, trouble swallowing and voice change.   Respiratory: Negative.  Negative for cough, choking and shortness of breath.   Gastrointestinal: Negative.  Negative for nausea and vomiting.   Musculoskeletal: Negative.  Negative for neck pain and neck stiffness.   Physical Exam Updated Vital Signs BP (!) 141/93 (BP Location: Left Arm)   Pulse 67   Temp 97.7 F (36.5 C) (Oral)   Resp 18   Ht 5\' 2"  (1.575 m)   Wt 90.7 kg   SpO2 98%   BMI 36.58 kg/m   Physical Exam Constitutional:      General: She is not in acute distress.    Appearance: Normal appearance. She is well-developed. She is obese. She is not ill-appearing or diaphoretic.  HENT:     Head: Normocephalic and atraumatic.     Jaw: There is normal jaw occlusion. No trismus.     Right Ear: External ear normal.     Left Ear: External ear normal.     Nose: Nose normal.     Right Sinus: No maxillary sinus tenderness or frontal sinus tenderness.     Left Sinus: No maxillary sinus tenderness or frontal sinus tenderness.     Mouth/Throat:      Comments: Patient with poor dentition overall, multiple dental caries and missing teeth.  Pain to percussion of teeth #21 and 22, dental surface cavities noted.  The patient has normal phonation and is in control of secretions. No stridor.  Midline uvula without edema. Soft palate rises symmetrically. No tonsillar erythema, swelling or exudates. Tongue protrusion is normal, floor of mouth is soft. No trismus. No creptius on neck palpation. No gingival erythema or fluctuance noted. Mucus membranes moist. No pallor noted. Eyes:     General: Vision grossly intact. Gaze aligned appropriately.     Pupils: Pupils are equal, round, and reactive to light.  Neck:     Musculoskeletal: Normal range of motion.     Trachea: Trachea and phonation normal. No tracheal deviation.  Pulmonary:     Effort: Pulmonary effort is normal. No respiratory distress.  Abdominal:     General: There is no distension.     Palpations: Abdomen is soft.     Tenderness: There is no abdominal tenderness. There is no guarding or rebound.  Musculoskeletal: Normal range of motion.  Skin:    General: Skin is  warm and dry.  Neurological:     Mental Status: She is alert.     GCS: GCS eye subscore is 4. GCS verbal subscore is 5. GCS motor subscore is 6.     Comments: Speech is clear and goal oriented, follows commands Major Cranial nerves without deficit, no facial droop Moves extremities without ataxia, coordination intact  Psychiatric:        Behavior: Behavior normal.    ED Treatments /  Results  Labs (all labs ordered are listed, but only abnormal results are displayed) Labs Reviewed - No data to display  EKG None  Radiology No results found.  Procedures Procedures (including critical care time)  Medications Ordered in ED Medications  oxyCODONE-acetaminophen (PERCOCET/ROXICET) 5-325 MG per tablet 1 tablet (1 tablet Oral Given 07/03/18 0610)  ibuprofen (ADVIL) tablet 400 mg (400 mg Oral Given 07/03/18 0610)     Initial Impression / Assessment and Plan / ED Course  I have reviewed the triage vital signs and the nursing notes.  Pertinent labs & imaging results that were available during my care of the patient were reviewed by me and considered in my medical decision making (see chart for details).    Patient given Percocet and ibuprofen by nursing staff.  My initial evaluation is after patient has received these medications.  She is overall well-appearing and in no acute distress on initial examination.  States that pain is still present, minimally improved after medications given less than 10 minutes ago.  Two dental surface cavities noted to teeth 21 and 22. No signs or symptoms of dental abscess, no swelling/erythema/tenderness of the gums.  Patient is well-appearing, afebrile, nontoxic, speaking well.  Patient able to swallow without pain.  No signs of swelling or concern for Ludwig's angina/Peritonsilar abscess/Retropharyngeal abscess or other deep tissue infections.  No sign of swelling of the neck, patient has good range of motion of the neck, no trismus.  Will treat with  clindamycin and Peridex. Patient urged to follow-up with dentist. Dental resource guide given.  Patient refused pregnancy test today, she reports IUD is in place.  At this time there does not appear to be any evidence of an acute emergency medical condition and the patient appears stable for discharge with appropriate outpatient follow up. Diagnosis was discussed with patient who verbalizes understanding of care plan and is agreeable to discharge. I have discussed return precautions with patient who verbalizes understanding of return precautions. Patient encouraged to follow-up with their PCP. All questions answered.  Note: Portions of this report may have been transcribed using voice recognition software. Every effort was made to ensure accuracy; however, inadvertent computerized transcription errors may still be present. Final Clinical Impressions(s) / ED Diagnoses   Final diagnoses:  Pain, dental  Dental caries    ED Discharge Orders         Ordered    clindamycin (CLEOCIN) 150 MG capsule  3 times daily     07/03/18 0632    chlorhexidine (PERIDEX) 0.12 % solution  2 times daily     07/03/18 2536           Deliah Boston, PA-C 07/03/18 Leary, Jenner, DO 07/03/18 [phone]

## 2018-07-03 NOTE — ED Notes (Signed)
Bed: WA11 Expected date:  Expected time:  Means of arrival:  Comments: 

## 2018-08-23 IMAGING — US US MFM FETAL BPP W/O NON-STRESS
1 series · 15 of 25 positions shown · non-contrast
Comparison: none

[Series 1: us mfm fetal bpp w/o non-stress · 25 acquisitions, 15 frames shown]
[im 1/25]
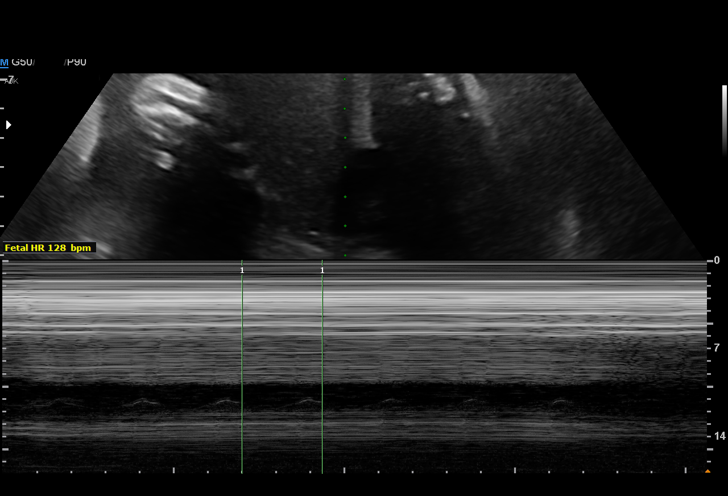
[im 3/25]
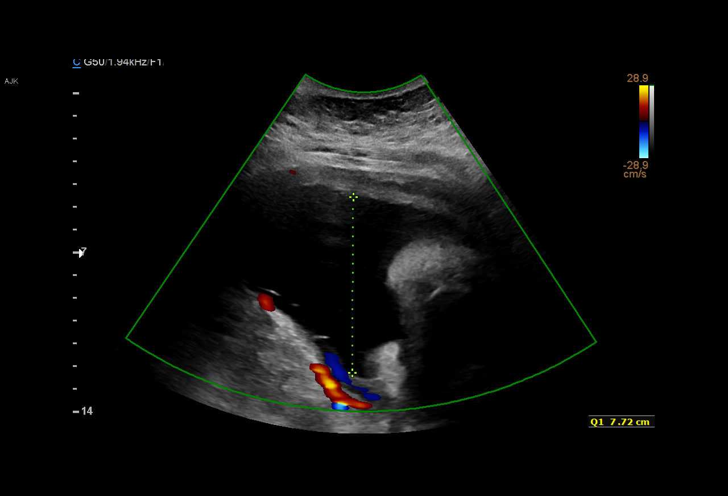
[im 5/25]
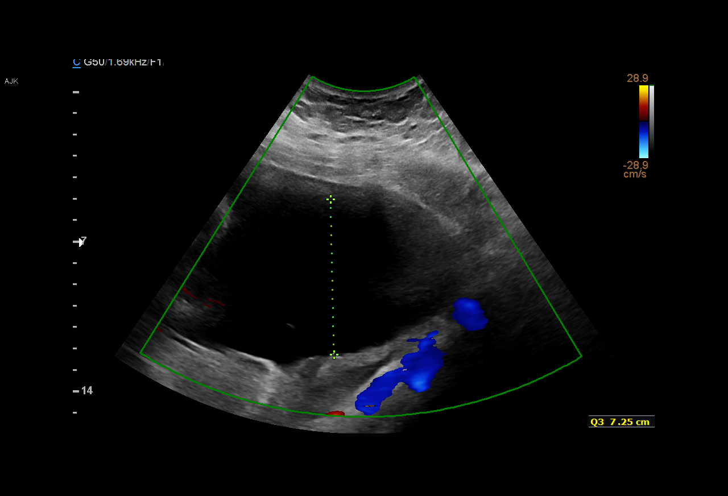
[im 6/25]
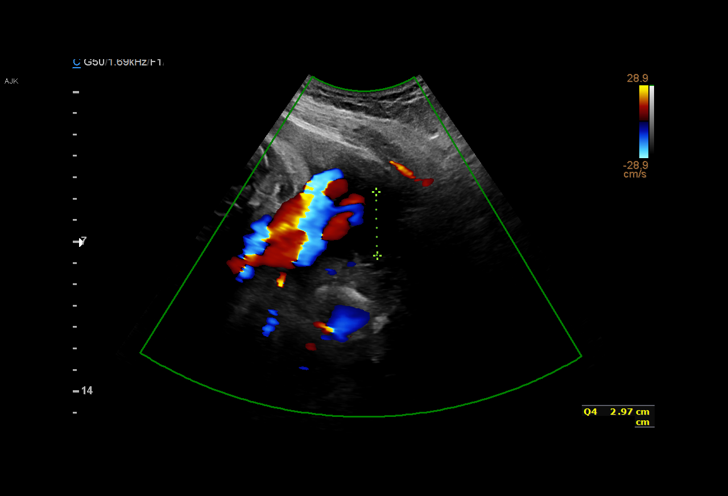
[im 8/25]
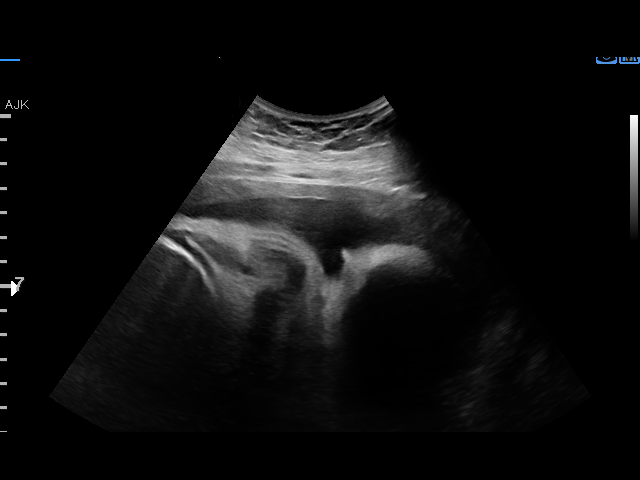
[im 10/25]
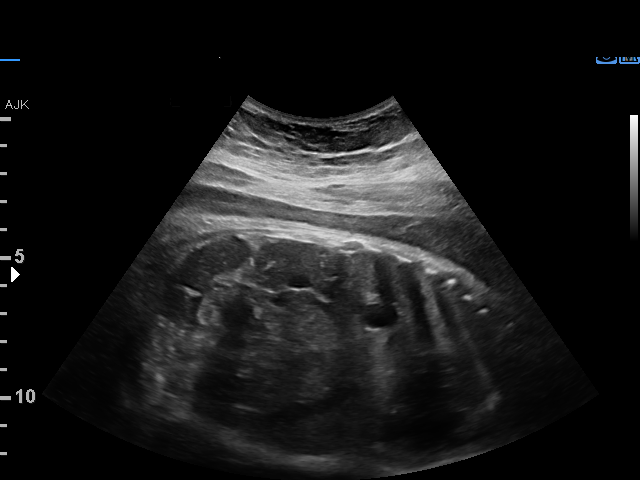
[im 11/25]
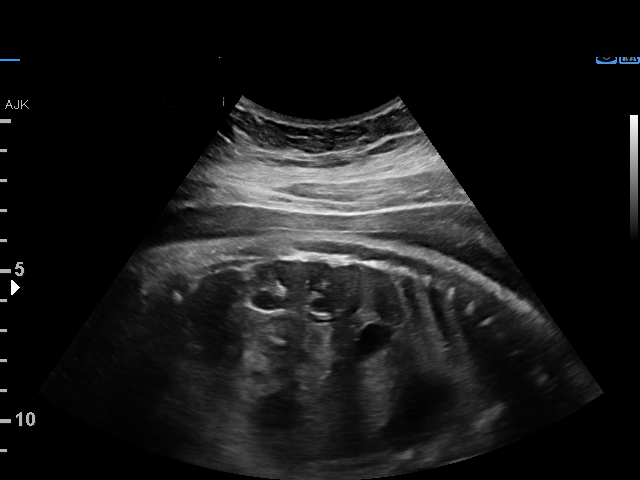
[im 13/25]
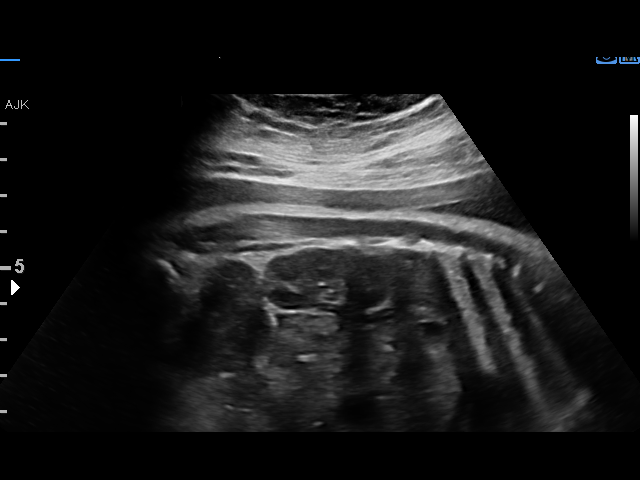
[im 15/25]
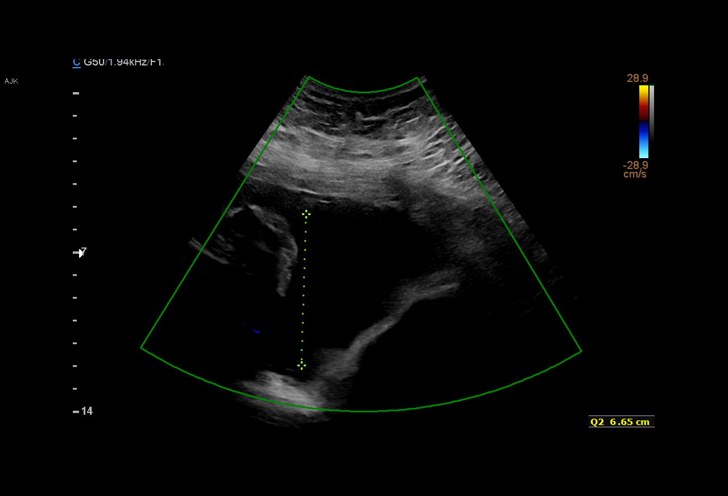
[im 16/25]
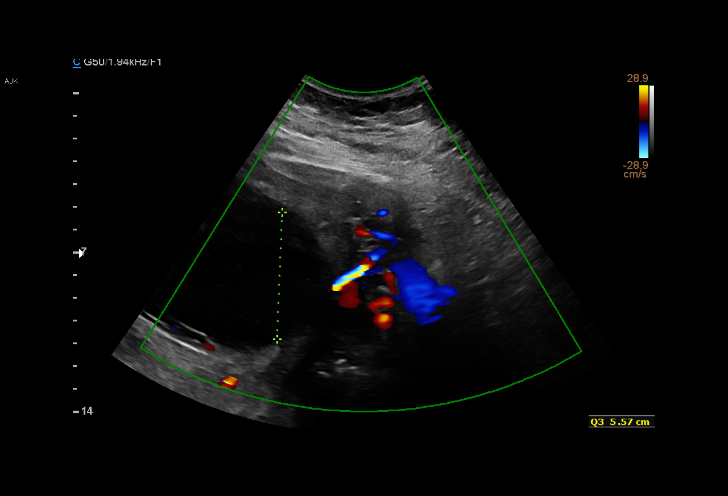
[im 18/25]
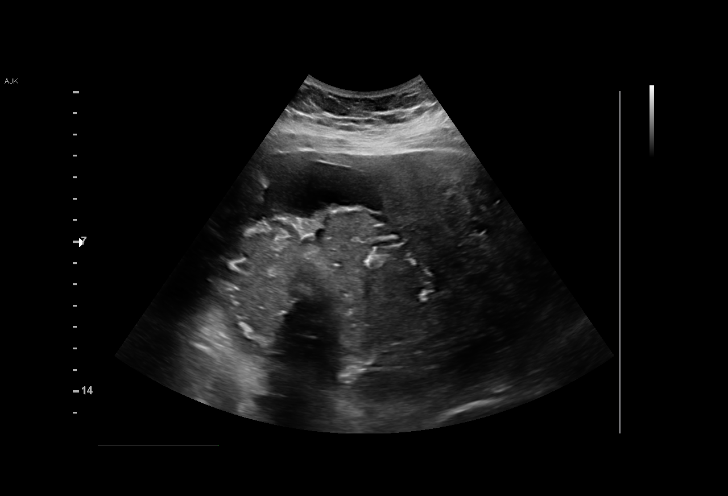
[im 20/25]
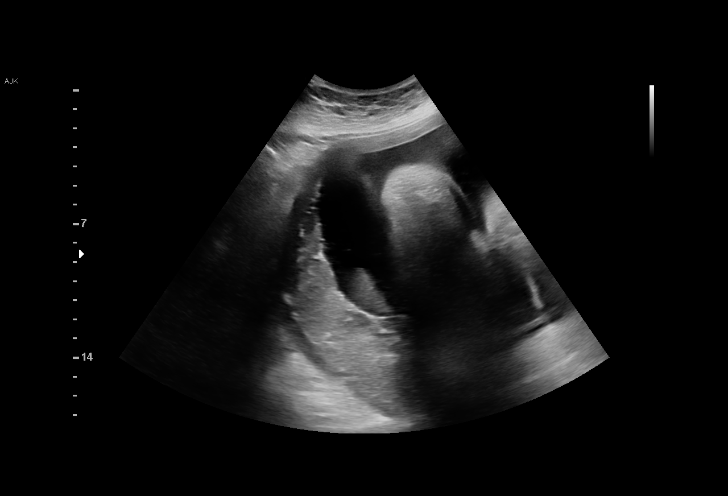
[im 21/25]
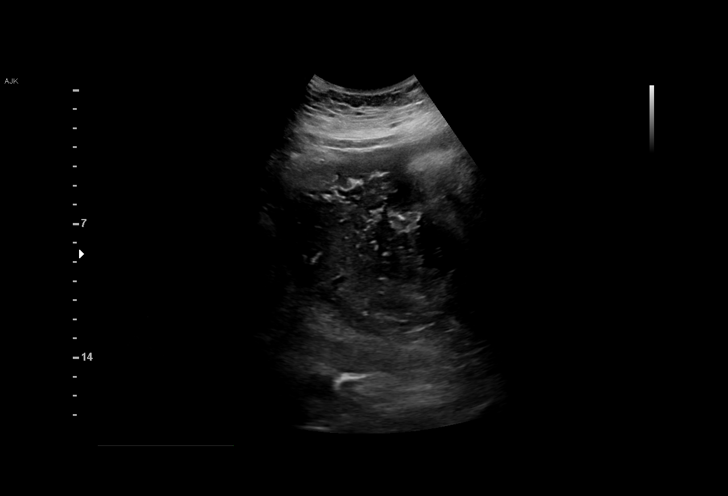
[im 23/25]
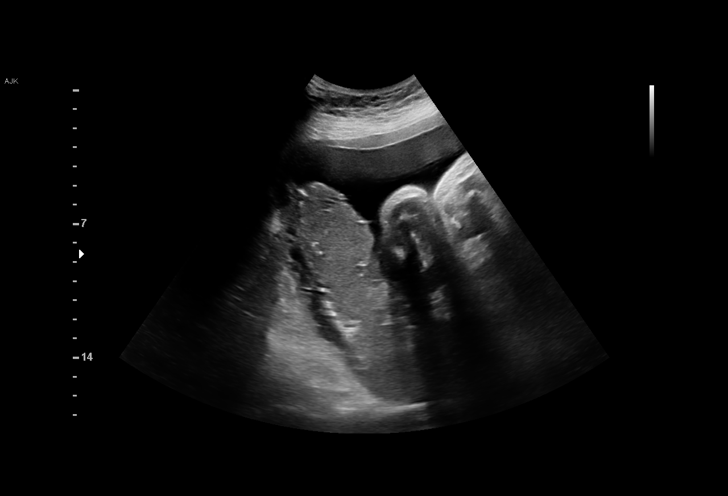
[im 25/25]
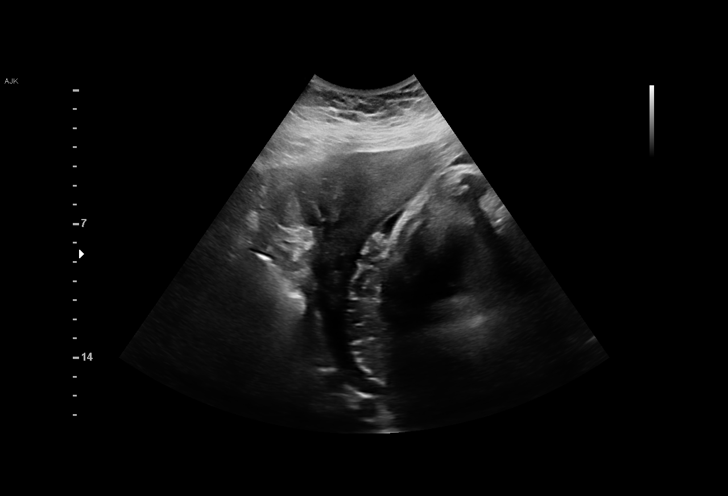

[15 of 25 positions shown; findings below may reference images not displayed]

OB/Gyn Clinic

1  OSMAR LATIMER            [id number]      [id number]     [id number]
Indications

38 weeks gestation of pregnancy
Obesity complicating pregnancy, third
trimester
Polyhydramnios, third trimester, antepartum
condition or complication, unspecified fetus
Tobacco use complicating pregnancy, third
trimester
Rh negative state in antepartum
Gestational diabetes in pregnancy, diet
controlled
Asthma                                         X99.29 j83.727
OB History

Blood Type:            Height:  5'2"   Weight (lb):  219       BMI:
Gravidity:    2         Term:   0        Prem:   0        SAB:   1
TOP:          0       Ectopic:  0        Living: 0
Fetal Evaluation

Num Of Fetuses:     1
Fetal Heart         128
Rate(bpm):
Cardiac Activity:   Observed
Presentation:       Cephalic
Placenta:           Posterior, above cervical os
P. Cord Insertion:  Previously Visualized
Amniotic Fluid
AFI FV:      Polyhydramnios

AFI Sum(cm)     %Tile       Largest Pocket(cm)
24.97           96

RUQ(cm)       RLQ(cm)       LUQ(cm)        LLQ(cm)
8.15
Biophysical Evaluation

Amniotic F.V:   Pocket => 2 cm two         F. Tone:        Observed
planes
F. Movement:    Observed                   Score:          [DATE]
F. Breathing:   Observed
Gestational Age

LMP:           42w 5d        Date:  07/07/16                 EDD:   04/13/17
Best:          38w 4d     Det. By:  U/S C R L  (10/23/16)    EDD:   05/12/17
Impression

Single living intrauterine pregnancy at 38w 4d, GDM
Cephalic presentation.
Polyhydramnios.
BPP [DATE].
Recommendations

Given GDM complicated by polyhydramnios recommend
delivery at 39 weeks or shortly thereafter.  No further
ultrasounds were arranged given anticipated delivery in the
next 7 days.

## 2019-01-06 ENCOUNTER — Ambulatory Visit: Payer: Medicaid Other | Attending: Internal Medicine

## 2019-01-06 DIAGNOSIS — Z20822 Contact with and (suspected) exposure to covid-19: Secondary | ICD-10-CM

## 2019-01-07 LAB — NOVEL CORONAVIRUS, NAA: SARS-CoV-2, NAA: NOT DETECTED

## 2019-01-10 ENCOUNTER — Telehealth: Payer: Self-pay | Admitting: General Practice

## 2019-01-10 NOTE — Telephone Encounter (Signed)
Patient is calling to receive her negative COVID result. Patient expressed understanding. 

## 2019-02-02 ENCOUNTER — Encounter: Payer: Self-pay | Admitting: Advanced Practice Midwife

## 2019-02-02 ENCOUNTER — Ambulatory Visit (INDEPENDENT_AMBULATORY_CARE_PROVIDER_SITE_OTHER): Payer: Medicaid Other | Admitting: Advanced Practice Midwife

## 2019-02-02 ENCOUNTER — Other Ambulatory Visit (HOSPITAL_COMMUNITY)
Admission: RE | Admit: 2019-02-02 | Discharge: 2019-02-02 | Disposition: A | Discharge status: Home / Self Care | Payer: BC Managed Care – PPO | Source: Ambulatory Visit | Attending: Advanced Practice Midwife | Admitting: Advanced Practice Midwife

## 2019-02-02 ENCOUNTER — Other Ambulatory Visit: Payer: Self-pay

## 2019-02-02 VITALS — BP 127/63 | HR 90 | Wt 207.1 lb

## 2019-02-02 DIAGNOSIS — Z01419 Encounter for gynecological examination (general) (routine) without abnormal findings: Secondary | ICD-10-CM

## 2019-02-02 DIAGNOSIS — Z0000 Encounter for general adult medical examination without abnormal findings: Secondary | ICD-10-CM | POA: Diagnosis not present

## 2019-02-02 DIAGNOSIS — Z30432 Encounter for removal of intrauterine contraceptive device: Secondary | ICD-10-CM

## 2019-02-02 NOTE — Progress Notes (Signed)
Wants IUD Removed so that her and her fiance can have another baby.

## 2019-02-02 NOTE — Progress Notes (Signed)
GYNECOLOGY ANNUAL PREVENTATIVE CARE ENCOUNTER NOTE  Subjective:   Mary Davies is a 29 y.o. G92P1011 female here for a routine annual gynecologic exam.  Current complaints: none.   Denies abnormal vaginal bleeding, discharge, pelvic pain, problems with intercourse or other gynecologic concerns.    Gynecologic History No LMP recorded. Contraception: IUD but patient would like this removed today as she desires a pregnancy.  Last Pap: unsure. Results were: unsure   Obstetric History OB History  Gravida Para Term Preterm AB Living  2 1 1   1 1   SAB TAB Ectopic Multiple Live Births  1     0 1    # Outcome Date GA Lbr Len/2nd Weight Sex Delivery Anes PTL Lv  2 Term 05/08/17 [redacted]w[redacted]d  5 lb 3.8 oz (2.375 kg) F CS-LTranv EPI  LIV  1 SAB             Past Medical History:  Diagnosis Date  . Asthma    prn inhaler  . Closed left radial fracture 01/15/2011  . Depression    hx of, fine now  . Fracture of metatarsal bone of left foot 04/22/2015   5th metatarsal  . Left breast abscess 04/2015    Past Surgical History:  Procedure Laterality Date  . CESAREAN SECTION N/A 05/08/2017   Procedure: CESAREAN SECTION;  Surgeon: 05/10/2017, MD;  Location: [facility];  Service: Obstetrics;  Laterality: N/A;  . INCISION AND DRAINAGE ABSCESS Left 09/24/2012   Procedure: INCISION AND DRAINAGE BREAST ABSCESS;  Surgeon: 11/24/2012, MD;  Location: [facility] OR;  Service: General;  Laterality: Left;  . INCISION AND DRAINAGE ABSCESS Left 05/08/2015   Procedure: INCISION AND DRAINAGE LEFT BREAST  ABSCESS;  Surgeon: 05/10/2015 III, MD;  Location: [facility];  Service: General;  Laterality: Left;    Current Outpatient Medications on File Prior to Visit  Medication Sig Dispense Refill  . chlorhexidine (PERIDEX) 0.12 % solution Use as directed 15 mLs in the mouth or throat 2 (two) times daily. Rinse and Spit. (Do NOT swallow) 120 mL 0  . ferrous sulfate 325 (65 FE) MG tablet Take 1 tablet  (325 mg total) by mouth 2 (two) times daily with a meal. (Patient not taking: Reported on 07/07/2017) 60 tablet 0   No current facility-administered medications on file prior to visit.    Allergies  Allergen Reactions  . Pineapple Shortness Of Breath, Itching and Swelling    Social History   Socioeconomic History  . Marital status: Single    Spouse name: Not on file  . Number of children: Not on file  . Years of education: Not on file  . Highest education level: Not on file  Occupational History  . Not on file  Tobacco Use  . Smoking status: Current Every Day Smoker    Packs/day: 0.25    Years: 10.00    Pack years: 2.50    Types: Cigarettes  . Smokeless tobacco: Never Used  . Tobacco comment: trying to quit  Substance and Sexual Activity  . Alcohol use: No  . Drug use: No  . Sexual activity: Not Currently    Birth control/protection: None  Other Topics Concern  . Not on file  Social History Narrative  . Not on file   Social Determinants of Health   Financial Resource Strain:   . Difficulty of Paying Living Expenses: Not on file  Food Insecurity:   . Worried About 07/09/2017  in the Last Year: Not on file  . Ran Out of Food in the Last Year: Not on file  Transportation Needs:   . Lack of Transportation (Medical): Not on file  . Lack of Transportation (Non-Medical): Not on file  Physical Activity:   . Days of Exercise per Week: Not on file  . Minutes of Exercise per Session: Not on file  Stress:   . Feeling of Stress : Not on file  Social Connections:   . Frequency of Communication with Friends and Family: Not on file  . Frequency of Social Gatherings with Friends and Family: Not on file  . Attends Religious Services: Not on file  . Active Member of Clubs or Organizations: Not on file  . Attends Archivist Meetings: Not on file  . Marital Status: Not on file  Intimate Partner Violence:   . Fear of Current or Ex-Partner: Not on file  .  Emotionally Abused: Not on file  . Physically Abused: Not on file  . Sexually Abused: Not on file    Family History  Problem Relation Age of Onset  . Diabetes Mother   . Asthma Father     The following portions of the patient's history were reviewed and updated as appropriate: allergies, current medications, past family history, past medical history, past social history, past surgical history and problem list.  Review of Systems Pertinent items noted in HPI and remainder of comprehensive ROS otherwise negative.   Objective:  BP 127/63   Pulse 90   Wt 207 lb 1.6 oz (93.9 kg)   BMI 37.88 kg/m  CONSTITUTIONAL: Well-developed, well-nourished female in no acute distress.  HENT:  Normocephalic, atraumatic, External right and left ear normal. Oropharynx is clear and moist EYES: Conjunctivae and EOM are normal. Pupils are equal, round, and reactive to light. No scleral icterus.  NECK: Normal range of motion, supple, no masses.  Normal thyroid.  SKIN: Skin is warm and dry. No rash noted. Not diaphoretic. No erythema. No pallor. NEUROLOGIC: Alert and oriented to person, place, and time. Normal reflexes, muscle tone coordination. No cranial nerve deficit noted. PSYCHIATRIC: Normal mood and affect. Normal behavior. Normal judgment and thought content. CARDIOVASCULAR: Normal heart rate noted, regular rhythm RESPIRATORY: Clear to auscultation bilaterally. Effort and breath sounds normal, no problems with respiration noted. ABDOMEN: Soft, normal bowel sounds, no distention noted.  No tenderness, rebound or guarding.  PELVIC: Normal appearing external genitalia; normal appearing vaginal mucosa and cervix.  No abnormal discharge noted.  Pap smear obtained.  Normal uterine size, no other palpable masses, no uterine or adnexal tenderness. MUSCULOSKELETAL: Normal range of motion. No tenderness.  No cyanosis, clubbing, or edema.  2+ distal pulses.  GYNECOLOGY OFFICE PROCEDURE NOTE  IUD Removal   Patient identified, informed consent performed, consent signed.  Patient was in the dorsal lithotomy position, normal external genitalia was noted.  A speculum was placed in the patient's vagina, normal discharge was noted, no lesions. The cervix was visualized, no lesions, no abnormal discharge.  The strings of the IUD were grasped and pulled using kelly forceps. The IUD was removed in its entirety. Patient tolerated the procedure well.    Patient plans for pregnancy soon and she was told to avoid teratogens, take PNV and folic acid.  Routine preventative health maintenance measures emphasized.  Assessment and Plan:  1. Encounter for IUD removal  2. Well woman exam with routine gynecological exam - Cytology - PAP( )  Will follow up results of  pap smear and manage accordingly. Routine preventative health maintenance measures emphasized. Please refer to After Visit Summary for other counseling recommendations.    Thressa Sheller DNP, CNM  02/02/19  3:49 PM

## 2019-02-04 ENCOUNTER — Telehealth: Payer: Self-pay | Admitting: *Deleted

## 2019-02-04 LAB — CYTOLOGY - PAP
Chlamydia: NEGATIVE
Comment: NEGATIVE
Comment: NEGATIVE
Comment: NORMAL
High risk HPV: NEGATIVE
Neisseria Gonorrhea: NEGATIVE

## 2019-02-04 NOTE — Telephone Encounter (Addendum)
-----   Message from Armando Reichert, CNM sent at 02/04/2019  2:33 PM EST ----- Patient LSIL and HPV negative pap. She does not have mychart. Please call her and let her know the recommendation is to repeat the pap in one year.   Called pt and informed her of Pap results as stated by Thressa Sheller, CNM. Pt was advised that no further tests or treatment are indicated @ this time. She will need repeat Pap in 1 year. Pt voiced understanding and had no questions.

## 2019-02-09 ENCOUNTER — Encounter: Payer: Self-pay | Admitting: General Practice

## 2019-03-17 ENCOUNTER — Inpatient Hospital Stay (EMERGENCY_DEPARTMENT_HOSPITAL)
Admission: AD | Admit: 2019-03-17 | Discharge: 2019-03-17 | Disposition: A | Discharge status: Home / Self Care | Payer: BC Managed Care – PPO | Source: Home / Self Care | Attending: Obstetrics and Gynecology | Admitting: Obstetrics and Gynecology

## 2019-03-17 ENCOUNTER — Encounter (HOSPITAL_COMMUNITY): Payer: Self-pay | Admitting: Obstetrics and Gynecology

## 2019-03-17 ENCOUNTER — Inpatient Hospital Stay (HOSPITAL_COMMUNITY)
Admission: EM | Admit: 2019-03-17 | Discharge: 2019-03-17 | Disposition: A | Discharge status: Home / Self Care | Payer: BC Managed Care – PPO | Attending: Family Medicine | Admitting: Family Medicine

## 2019-03-17 ENCOUNTER — Encounter (HOSPITAL_COMMUNITY): Payer: Self-pay | Admitting: Emergency Medicine

## 2019-03-17 ENCOUNTER — Inpatient Hospital Stay (HOSPITAL_COMMUNITY): Payer: BC Managed Care – PPO

## 2019-03-17 ENCOUNTER — Other Ambulatory Visit: Payer: Self-pay

## 2019-03-17 DIAGNOSIS — R109 Unspecified abdominal pain: Secondary | ICD-10-CM | POA: Insufficient documentation

## 2019-03-17 DIAGNOSIS — F17210 Nicotine dependence, cigarettes, uncomplicated: Secondary | ICD-10-CM | POA: Insufficient documentation

## 2019-03-17 DIAGNOSIS — O99331 Smoking (tobacco) complicating pregnancy, first trimester: Secondary | ICD-10-CM | POA: Diagnosis not present

## 2019-03-17 DIAGNOSIS — O3680X0 Pregnancy with inconclusive fetal viability, not applicable or unspecified: Secondary | ICD-10-CM

## 2019-03-17 DIAGNOSIS — R1030 Lower abdominal pain, unspecified: Secondary | ICD-10-CM | POA: Insufficient documentation

## 2019-03-17 DIAGNOSIS — Z3A00 Weeks of gestation of pregnancy not specified: Secondary | ICD-10-CM | POA: Diagnosis not present

## 2019-03-17 DIAGNOSIS — Z3A01 Less than 8 weeks gestation of pregnancy: Secondary | ICD-10-CM | POA: Insufficient documentation

## 2019-03-17 DIAGNOSIS — O26891 Other specified pregnancy related conditions, first trimester: Secondary | ICD-10-CM | POA: Diagnosis not present

## 2019-03-17 DIAGNOSIS — O26899 Other specified pregnancy related conditions, unspecified trimester: Secondary | ICD-10-CM

## 2019-03-17 DIAGNOSIS — M549 Dorsalgia, unspecified: Secondary | ICD-10-CM | POA: Insufficient documentation

## 2019-03-17 LAB — WET PREP, GENITAL
Sperm: NONE SEEN
Trich, Wet Prep: NONE SEEN
Yeast Wet Prep HPF POC: NONE SEEN

## 2019-03-17 LAB — URINALYSIS, ROUTINE W REFLEX MICROSCOPIC
Bilirubin Urine: NEGATIVE
Glucose, UA: NEGATIVE mg/dL
Hgb urine dipstick: NEGATIVE
Ketones, ur: NEGATIVE mg/dL
Leukocytes,Ua: NEGATIVE
Nitrite: NEGATIVE
Protein, ur: NEGATIVE mg/dL
Specific Gravity, Urine: 1.026 (ref 1.005–1.030)
pH: 5 (ref 5.0–8.0)

## 2019-03-17 LAB — CBC
HCT: 41.4 % (ref 36.0–46.0)
Hemoglobin: 13.7 g/dL (ref 12.0–15.0)
MCH: 32.3 pg (ref 26.0–34.0)
MCHC: 33.1 g/dL (ref 30.0–36.0)
MCV: 97.6 fL (ref 80.0–100.0)
Platelets: 227 10*3/uL (ref 150–400)
RBC: 4.24 MIL/uL (ref 3.87–5.11)
RDW: 13.1 % (ref 11.5–15.5)
WBC: 7.2 10*3/uL (ref 4.0–10.5)
nRBC: 0 % (ref 0.0–0.2)

## 2019-03-17 LAB — HCG, QUANTITATIVE, PREGNANCY: hCG, Beta Chain, Quant, S: 1056 m[IU]/mL — ABNORMAL HIGH (ref <5)

## 2019-03-17 LAB — POC URINE PREG, ED: Preg Test, Ur: POSITIVE — AB

## 2019-03-17 MED ORDER — PROMETHAZINE HCL 25 MG/ML IJ SOLN
12.5000 mg | Freq: Once | INTRAMUSCULAR | Status: AC
  Administered 2019-03-17: 12.5 mg via INTRAMUSCULAR
  Filled 2019-03-17: qty 1

## 2019-03-17 MED ORDER — IBUPROFEN 600 MG PO TABS: 600.0000 mg | ORAL_TABLET | Freq: Four times a day (QID) | ORAL | 0 refills | Status: DC | PRN

## 2019-03-17 MED ORDER — HYDROMORPHONE HCL 1 MG/ML IJ SOLN
1.0000 mg | Freq: Once | INTRAMUSCULAR | Status: AC
  Administered 2019-03-17: 1 mg via INTRAMUSCULAR
  Filled 2019-03-17: qty 1

## 2019-03-17 MED ORDER — RHO D IMMUNE GLOBULIN 1500 UNIT/2ML IJ SOSY
300.0000 ug | PREFILLED_SYRINGE | Freq: Once | INTRAMUSCULAR | Status: AC
  Administered 2019-03-17: 08:00:00 300 ug via INTRAMUSCULAR
  Filled 2019-03-17: qty 2

## 2019-03-17 MED ORDER — ACETAMINOPHEN 500 MG PO TABS
1000.0000 mg | ORAL_TABLET | Freq: Once | ORAL | Status: AC
  Administered 2019-03-17: 18:00:00 1000 mg via ORAL
  Filled 2019-03-17: qty 2

## 2019-03-17 NOTE — MAU Provider Note (Addendum)
History     CSN: [id number]  Arrival date and time: 03/17/19 1721   First Provider Initiated Contact with Patient 03/17/19 1805      Chief Complaint  Patient presents with   left sided pain   Back Pain   HPI Mary Davies is a 29 y.o. G42P1011 female who presents stating that she is having left sided abdominal and back pain that started yesterday at work, and increasingly worsened before stabilizing at intensity of 8/10. She reports decreased feeding since yesterday (last ate mid-day yesterday), but denies both difficulty swallowing and significant N/V. Nothing seems to help (including hot baths), and pain increases with movement. Patient notes intermittent light vaginal spotting.   OB History     Gravida  3   Para  1   Term  1   Preterm      AB  1   Living  1      SAB  1   TAB      Ectopic      Multiple  0   Live Births  1           Past Medical History:  Diagnosis Date   Asthma    prn inhaler   Closed left radial fracture 01/15/2011   Depression    hx of, fine now   Fracture of metatarsal bone of left foot 04/22/2015   5th metatarsal   Left breast abscess 04/2015    Past Surgical History:  Procedure Laterality Date   CESAREAN SECTION N/A 05/08/2017   Procedure: CESAREAN SECTION;  Surgeon: Jonnie Kind, MD;  Location: [place];  Service: Obstetrics;  Laterality: N/A;   INCISION AND DRAINAGE ABSCESS Left 09/24/2012   Procedure: INCISION AND DRAINAGE BREAST ABSCESS;  Surgeon: Merrie Roof, MD;  Location: [place];  Service: General;  Laterality: Left;   INCISION AND DRAINAGE ABSCESS Left 05/08/2015   Procedure: INCISION AND DRAINAGE LEFT BREAST  ABSCESS;  Surgeon: Autumn Messing III, MD;  Location: [place];  Service: General;  Laterality: Left;    Family History  Problem Relation Age of Onset   Diabetes Mother    Asthma Father     Social History   Tobacco Use   Smoking status: Current Every Day Smoker    Packs/day:  0.25    Years: 10.00    Pack years: 2.50    Types: Cigarettes   Smokeless tobacco: Never Used   Tobacco comment: trying to quit  Substance Use Topics   Alcohol use: No   Drug use: No    Allergies:  Allergies  Allergen Reactions   Pineapple Shortness Of Breath, Itching and Swelling    No medications prior to admission.    Review of Systems  Constitutional: Positive for appetite change (eating less). Activity change: decreased ambulation.  Gastrointestinal: Positive for abdominal pain (left sided).  Genitourinary: Negative for dysuria. Vaginal bleeding: light spotting.  Neurological: Negative for headaches.   Physical Exam   Blood pressure 127/69, pulse 70, temperature 98.1 F (36.7 C), temperature source Oral, resp. rate 18, last menstrual period 02/10/2019, not currently breastfeeding.  Physical Exam  Cardiovascular: Normal rate and regular rhythm.  Respiratory: Effort normal and breath sounds normal.  GI: Bowel sounds are normal. There is no abdominal tenderness.    MAU Course   MDM Ultrasound results pending p.o. Tylenol given at 6:30  Assessment and Plan  29 y.o. [id number] with reported left sided abdominal pain and back pain not elicited  with palpation.  1. Continued pain management with p.o. tylenol as needed   Greer Ee 03/17/2019, 6:24 PM   I confirm that I was present and verified the information documented in the medical student's note and that I have also personally reperformed the history, physical exam and all medical decision making activities of this service and have verified that all service and findings are accurately documented in this student's note.   *Consult with Dr. Jolayne Panther @ [phone] - notified of patient's complaints, assessments, lab & U/S results from earlier today, recommended tx plan repeat U/S, if unchanged d/c home with instructions for returning and to take Tylenol prn pain.  US OB Transvaginal  Result Date: 03/17/2019 CLINICAL  DATA:  Abdominal pain during first trimester of pregnancy, LEFT lower quadrant pain for 1 day increased since earlier today, LMP 02/10/2019; no quantitative beta HCG currently available for correlation EXAM: TRANSVAGINAL OB ULTRASOUND TECHNIQUE: Transvaginal ultrasound was performed for complete evaluation of the gestation as well as the maternal uterus, adnexal regions, and pelvic cul-de-sac. COMPARISON:  Earlier exam of 03/17/2019 FINDINGS: Intrauterine gestational sac: None identified Yolk sac:  N/A Embryo:  N/A Cardiac Activity: N/A Heart Rate: N/A bpm MSD:   mm    w     d CRL:     mm    w  d                  [place] EDC: Subchorionic hemorrhage:  N/A Maternal uterus/adnexae: Uterus anteverted, normal in size and morphology. No uterine mass, gestational sac, endometrial fluid or other focal abnormality seen. RIGHT ovary normal size and morphology 3.9 x 2.9 x 1.8 cm. LEFT ovary measures 3.3 x 1.8 x 1.0 cm, less well visualized due to bowel loops, grossly unremarkable. No adnexal masses or free pelvic fluid. Patient is tender with transvaginal imaging at the LEFT adnexa. IMPRESSION: No intrauterine gestation identified. Findings are consistent with pregnancy of unknown location. Differential diagnosis includes early ectopic pregnancy too early to visualize, spontaneous abortion, and ectopic pregnancy. Serial quantitative beta HCG and or follow-up ultrasound recommended to definitively exclude ectopic pregnancy. Electronically Signed   By: Ulyses Southward M.D.   On: 03/17/2019 19:21   Rx given for Ibuprofen 600 mg every 6 hours prn pain. Advised to alternate with Tylenol 1000 mg every 6 hours. Advised to keep scheduled appointment for repeat HCG on Friday 03/19/2019. Information provided on abdominal pain in pregnancy. Patient verbalized an understanding of the plan of care and agrees.   Raelyn Mora, CNM 03/17/2019 7:58 PM

## 2019-03-17 NOTE — ED Triage Notes (Signed)
Patient reports LLQ abdominal pain and vaginal bleeding yesterday , she is [redacted] weeks pregnant , denies emesis or diarrhea , no fever or chills , evaluated by PA at triage .

## 2019-03-17 NOTE — MAU Note (Addendum)
Pt presents to MAU with c/o lower left sided abdominal pain that started yesterday and some light spotting. She was seen earlier and had ectopic workup. Pain has increased on the left side since then.

## 2019-03-17 NOTE — Discharge Instructions (Signed)
Return to MAU:  If you have heavier bleeding that soaks through more that 2 pads per hour for an hour or more  If you bleed so much that you feel like you might pass out or you do pass out  If you have significant abdominal pain that is not improved with Tylenol 1000 mg or Ibuprofen 600 mg every 6 hours as needed for pain  If you develop a fever > 100.5

## 2019-03-17 NOTE — Progress Notes (Signed)
GC/Chlamydia & wet prep cultures obtained bu this RN and sent to lab.

## 2019-03-17 NOTE — MAU Provider Note (Signed)
Chief Complaint: Vaginal Bleeding ([redacted] Weeks Pregnant) and Abdominal Pain   First Provider Initiated Contact with Patient 03/17/19 0705     SUBJECTIVE HPI: Mary Davies is a 29 y.o. G3P1011 at [redacted]w[redacted]d who presents to [facility] reporting abdominal pain. Symptoms started last night while at work. Reports lower abdominal pain primarily in her left lower quadrant. Has also had some light spotting. Denies fever/chills, n/v/d, constipation, dysuria, or vaginal discharge.   Location: abdomen Quality: cramping Severity: 8/10 on pain scale Duration: <1 day Timing: intermittent Modifying factors: none Associated signs and symptoms: none  Past Medical History:  Diagnosis Date  . Asthma    prn inhaler  . Closed left radial fracture 01/15/2011  . Depression    hx of, fine now  . Fracture of metatarsal bone of left foot 04/22/2015   5th metatarsal  . Left breast abscess 04/2015   OB History  Gravida Para Term Preterm AB Living  3 1 1   1 1   SAB TAB Ectopic Multiple Live Births  1     0 1    # Outcome Date GA Lbr Len/2nd Weight Sex Delivery Anes PTL Lv  3 Current           2 Term 05/08/17 [redacted]w[redacted]d  2375 g F CS-LTranv EPI  LIV  1 SAB            Past Surgical History:  Procedure Laterality Date  . CESAREAN SECTION N/A 05/08/2017   Procedure: CESAREAN SECTION;  Surgeon: 05/10/2017, MD;  Location: [facility];  Service: Obstetrics;  Laterality: N/A;  . INCISION AND DRAINAGE ABSCESS Left 09/24/2012   Procedure: INCISION AND DRAINAGE BREAST ABSCESS;  Surgeon: 11/24/2012, MD;  Location: [facility];  Service: General;  Laterality: Left;  . INCISION AND DRAINAGE ABSCESS Left 05/08/2015   Procedure: INCISION AND DRAINAGE LEFT BREAST  ABSCESS;  Surgeon: 05/10/2015 III, MD;  Location: [facility];  Service: General;  Laterality: Left;   Social History   Socioeconomic History  . Marital status: Single    Spouse name: Not on file  . Number of children: Not on file   . Years of education: Not on file  . Highest education level: Not on file  Occupational History  . Not on file  Tobacco Use  . Smoking status: Current Every Day Smoker    Packs/day: 0.25    Years: 10.00    Pack years: 2.50    Types: Cigarettes  . Smokeless tobacco: Never Used  . Tobacco comment: trying to quit  Substance and Sexual Activity  . Alcohol use: No  . Drug use: No  . Sexual activity: Not Currently    Birth control/protection: None  Other Topics Concern  . Not on file  Social History Narrative  . Not on file   Social Determinants of Health   Financial Resource Strain:   . Difficulty of Paying Living Expenses: Not on file  Food Insecurity:   . Worried About [place] in the Last Year: Not on file  . Ran Out of Food in the Last Year: Not on file  Transportation Needs:   . Lack of Transportation (Medical): Not on file  . Lack of Transportation (Non-Medical): Not on file  Physical Activity:   . Days of Exercise per Week: Not on file  . Minutes of Exercise per Session: Not on file  Stress:   . Feeling of Stress : Not on file  Social  Connections:   . Frequency of Communication with Friends and Family: Not on file  . Frequency of Social Gatherings with Friends and Family: Not on file  . Attends Religious Services: Not on file  . Active Member of Clubs or Organizations: Not on file  . Attends Banker Meetings: Not on file  . Marital Status: Not on file  Intimate Partner Violence:   . Fear of Current or Ex-Partner: Not on file  . Emotionally Abused: Not on file  . Physically Abused: Not on file  . Sexually Abused: Not on file   Family History  Problem Relation Age of Onset  . Diabetes Mother   . Asthma Father    No current facility-administered medications on file prior to encounter.   Current Outpatient Medications on File Prior to Encounter  Medication Sig Dispense Refill  . chlorhexidine (PERIDEX) 0.12 % solution Use as directed  15 mLs in the mouth or throat 2 (two) times daily. Rinse and Spit. (Do NOT swallow) 120 mL 0  . ferrous sulfate 325 (65 FE) MG tablet Take 1 tablet (325 mg total) by mouth 2 (two) times daily with a meal. (Patient not taking: Reported on 07/07/2017) 60 tablet 0   Allergies  Allergen Reactions  . Pineapple Shortness Of Breath, Itching and Swelling    I have reviewed patient's Past Medical Hx, Surgical Hx, Family Hx, Social Hx, medications and allergies.   Review of Systems  Constitutional: Negative.   Gastrointestinal: Positive for abdominal pain. Negative for constipation, diarrhea, nausea and vomiting.  Genitourinary: Positive for vaginal bleeding. Negative for dysuria.    OBJECTIVE Patient Vitals for the past 24 hrs:  BP Temp Temp src Pulse Resp SpO2 Height Weight  03/17/19 0815 124/87 98.7 F (37.1 C) Oral 67 18 99 % -- --  03/17/19 0541 115/75 98.3 F (36.8 C) Oral 64 20 -- 5\' 2"  (1.575 m) 93.6 kg  03/17/19 0512 -- -- -- -- -- -- 5\' 2"  (1.575 m) 100 kg  03/17/19 0511 128/90 97.8 F (36.6 C) Oral 74 16 99 % -- --   Constitutional: Well-developed, well-nourished female in no acute distress.  Cardiovascular: normal rate & rhythm, no murmur Respiratory: normal rate and effort. Lung sounds clear throughout GI: Abd soft, non-tender, Pos BS x 4. No guarding or rebound tenderness MS: Extremities nontender, no edema, normal ROM Neurologic: Alert and oriented x 4.    LAB RESULTS Results for orders placed or performed during the hospital encounter of 03/17/19 (from the past 24 hour(s))  POC Urine Pregnancy, ED (not at [facility])     Status: Abnormal   Collection Time: 03/17/19  5:12 AM  Result Value Ref Range   Preg Test, Ur POSITIVE (A) NEGATIVE  Urinalysis, Routine w reflex microscopic     Status: None   Collection Time: 03/17/19  5:53 AM  Result Value Ref Range   Color, Urine YELLOW YELLOW   APPearance CLEAR CLEAR   Specific Gravity, Urine 1.026 1.005 - 1.030   pH 5.0 5.0 - 8.0    Glucose, UA NEGATIVE NEGATIVE mg/dL   Hgb urine dipstick NEGATIVE NEGATIVE   Bilirubin Urine NEGATIVE NEGATIVE   Ketones, ur NEGATIVE NEGATIVE mg/dL   Protein, ur NEGATIVE NEGATIVE mg/dL   Nitrite NEGATIVE NEGATIVE   Leukocytes,Ua NEGATIVE NEGATIVE  Rh IG workup (includes ABO/Rh)     Status: None (Preliminary result)   Collection Time: 03/17/19  5:53 AM  Result Value Ref Range   Gestational Age(Wks) 5  ABO/RH(D) O NEG    Antibody Screen NEG    Unit Number [id number]    Blood Component Type RHIG    Unit division 00    Status of Unit ISSUED    Transfusion Status      OK TO TRANSFUSE Performed at [facility], [address] [place], [place] [address]   CBC     Status: None   Collection Time: 03/17/19  6:16 AM  Result Value Ref Range   WBC 7.2 4.0 - 10.5 K/uL   RBC 4.24 3.87 - 5.11 MIL/uL   Hemoglobin 13.7 12.0 - 15.0 g/dL   HCT 41.4 36.0 - 46.0 %   MCV 97.6 80.0 - 100.0 fL   MCH 32.3 26.0 - 34.0 pg   MCHC 33.1 30.0 - 36.0 g/dL   RDW 13.1 11.5 - 15.5 %   Platelets 227 150 - 400 K/uL   nRBC 0.0 0.0 - 0.2 %  hCG, quantitative, pregnancy     Status: Abnormal   Collection Time: 03/17/19  6:17 AM  Result Value Ref Range   hCG, Beta Chain, Quant, S 1,056 (H) <5 mIU/mL  Wet prep, genital     Status: Abnormal   Collection Time: 03/17/19  7:20 AM   Specimen: PATH Cytology Cervicovaginal Ancillary Only  Result Value Ref Range   Yeast Wet Prep HPF POC NONE SEEN NONE SEEN   Trich, Wet Prep NONE SEEN NONE SEEN   Clue Cells Wet Prep HPF POC PRESENT (A) NONE SEEN   WBC, Wet Prep HPF POC FEW (A) NONE SEEN   Sperm NONE SEEN     IMAGING US OB LESS THAN 14 WEEKS WITH OB TRANSVAGINAL  Result Date: 03/17/2019 CLINICAL DATA:  Abdominal pain affecting pregnancy. First trimester pregnancy. EXAM: OBSTETRIC <14 WK [place] AND TRANSVAGINAL OB US TECHNIQUE: Both transabdominal and transvaginal ultrasound examinations were performed for complete evaluation of the gestation as well as the  maternal uterus, adnexal regions, and pelvic cul-de-sac. Transvaginal technique was performed to assess early pregnancy. COMPARISON:  None for this pregnancy. FINDINGS: Intrauterine gestational sac: No intrauterine gestational sac present. Maternal uterus/adnexae: Peripherally hyperechoic area is present in the left adnexa with surrounding vascularity. This likely reflects a corpus luteal cyst. No other focal lesions are present in the adnexa. The uterus is otherwise unremarkable. No significant free fluid is present. IMPRESSION: No intrauterine pregnancy present. Differential diagnosis is level pregnancy, too early to visualize versus is missed abortion. Follow-up quantitative HCG with or without ultrasound may be useful for further evaluation. Electronically Signed   By: San Morelle M.D.   On: 03/17/2019 07:32    MAU COURSE Orders Placed This Encounter  Procedures  . Wet prep, genital  . US OB LESS THAN 14 WEEKS WITH OB TRANSVAGINAL  . Urinalysis, Routine w reflex microscopic  . CBC  . hCG, quantitative, pregnancy  . POC Urine Pregnancy, ED (not at [facility])  . Rh IG workup (includes ABO/Rh)  . Discharge patient   Meds ordered this encounter  Medications  . rho (d) immune globulin (RHIG/RHOPHYLAC) injection 300 mcg  . HYDROmorphone (DILAUDID) injection 1 mg  . promethazine (PHENERGAN) injection 12.5 mg    MDM +UPT UA, wet prep, GC/chlamydia, CBC, ABO/Rh, quant hCG, and [place] today to rule out ectopic pregnancy which can be life threatening.   RH negative. Given rhogam in MAU.   Ultrasound shows left adnexal mass that is likely a corpus luteal cyst. Images reviewed by Dr. Nehemiah Settle as well. No evidence  of ectopic at this time and no free fluid.  HCG is 1056. Will bring patient back to office on Friday for stat HCG  ASSESSMENT 1. Pregnancy of unknown anatomic location   2. Abdominal pain affecting pregnancy     PLAN Discharge home in stable condition. SAB vs ectopic  precautions Scheduled for stat HCG at [facility] on Friday morning  Follow-up Information    [facility] Follow up.   Specialty: Obstetrics and Gynecology Why: return for worsening symptoms Contact information: [address] [phone] [place]he[place] [address] [phone]         Allergies as of 03/17/2019      Reactions   Pineapple Shortness Of Breath, Itching, Swelling      Medication List    STOP taking these medications   chlorhexidine 0.12 % solution Commonly known as: Peridex   ferrous sulfate 325 (65 FE) MG tablet        Judeth Horn, NP 03/17/2019  8:20 AM

## 2019-03-17 NOTE — Discharge Instructions (Signed)
Return to care  °· If you have heavier bleeding that soaks through more that 2 pads per hour for an hour or more °· If you bleed so much that you feel like you might pass out or you do pass out °· If you have significant abdominal pain that is not improved with Tylenol  °· If you develop a fever > 100.5 ° °

## 2019-03-17 NOTE — MAU Note (Signed)
PT WENT  TO [place] - POSITIVE UPT. PT HAD 3 POSITIVE UPT AT HOME . SHE HAS LEFT LOWER ABD - STARTED Tuesday AM.  HAD VAG BLEEDING ON TP- IN UNDERWEAR - STARTED Tuesday NIGHT .  NO MEDS FOR PAIN  LAST SEX-  Sunday.

## 2019-03-17 NOTE — ED Provider Notes (Signed)
MSE was initiated and I personally evaluated the patient and placed orders (if any) at  5:21 AM on March 17, 2019.  Patient is a G2P1, [redacted] weeks pregnant, with LLQ abdominal pain and cramping.  She also reports vaginal bleeding yesterday.  VSS.  Discussed with CNM, who will accept in transfer to MAU.   The patient appears stable so that the remainder of the MSE may be completed by another provider.   Roxy Horseman, PA-C 03/17/19 0534    Zadie Rhine, MD 03/17/19 [phone]

## 2019-03-17 NOTE — MAU Note (Signed)
RN in to give patient discharge paperwork and patient not in room. No patient belongings in room and gown was placed on stretcher. Patient did not receive paperwork or sign AVS.

## 2019-03-18 LAB — RH IG WORKUP (INCLUDES ABO/RH)
ABO/RH(D): O NEG
Antibody Screen: NEGATIVE
Gestational Age(Wks): 5
Unit division: 0

## 2019-03-18 LAB — GC/CHLAMYDIA PROBE AMP (~~LOC~~) NOT AT ARMC
Chlamydia: NEGATIVE
Comment: NEGATIVE
Comment: NORMAL
Neisseria Gonorrhea: NEGATIVE

## 2019-03-19 ENCOUNTER — Other Ambulatory Visit: Payer: Self-pay

## 2019-03-19 ENCOUNTER — Telehealth: Payer: Self-pay | Admitting: Obstetrics and Gynecology

## 2019-03-19 ENCOUNTER — Ambulatory Visit (INDEPENDENT_AMBULATORY_CARE_PROVIDER_SITE_OTHER): Payer: BC Managed Care – PPO | Admitting: *Deleted

## 2019-03-19 DIAGNOSIS — O3680X0 Pregnancy with inconclusive fetal viability, not applicable or unspecified: Secondary | ICD-10-CM

## 2019-03-19 LAB — BETA HCG QUANT (REF LAB): hCG Quant: 283 m[IU]/mL

## 2019-03-19 NOTE — Progress Notes (Signed)
11:40 received results stat bhcg 283. Reviewed results and history with Dr. Debroah Loop. Instructed to telll patient this is a failed pregnancy, plan repeat non stat bhcg one week and sab fu with provider in 2 weeks.  I called Mary Davies and discussed results and plan of care with patient . I offered support. I explained registars will contact her with appt either today or early next week. She voices understanding.  Mary Procida,RN

## 2019-03-19 NOTE — Progress Notes (Signed)
Patient ID: Mary Davies, female   DOB: 1990/12/01, 29 y.o.   MRN: [id number] Patient seen and assessed by nursing staff during this encounter. I have reviewed the chart and agree with the documentation and plan.  Scheryl Darter, MD 03/19/2019 9:15 PM

## 2019-03-19 NOTE — Telephone Encounter (Signed)
Called the patient with the appointment information. Left a voicemail with appointment details and also mailing an appointment reminder.

## 2019-03-19 NOTE — Progress Notes (Signed)
Here for stat bhcg. C/o mild cramping which is much less than when she went to MAU.  C/o very light spotting since MAU visit. Explained we will draw stat bhcg and have her leave. Then when her results come back in about 2 hours we will call her after discussing with provider.  She voices understanding.  Legrand Como

## 2019-04-02 ENCOUNTER — Other Ambulatory Visit: Payer: BC Managed Care – PPO

## 2019-09-07 ENCOUNTER — Emergency Department (HOSPITAL_COMMUNITY)
Admission: EM | Admit: 2019-09-07 | Discharge: 2019-09-07 | Disposition: A | Discharge status: Home / Self Care | Payer: BC Managed Care – PPO | Attending: Emergency Medicine | Admitting: Emergency Medicine

## 2019-09-07 ENCOUNTER — Emergency Department (HOSPITAL_COMMUNITY): Payer: BC Managed Care – PPO

## 2019-09-07 ENCOUNTER — Encounter (HOSPITAL_COMMUNITY): Payer: Self-pay

## 2019-09-07 DIAGNOSIS — S6992XA Unspecified injury of left wrist, hand and finger(s), initial encounter: Secondary | ICD-10-CM | POA: Insufficient documentation

## 2019-09-07 DIAGNOSIS — Y999 Unspecified external cause status: Secondary | ICD-10-CM | POA: Diagnosis not present

## 2019-09-07 DIAGNOSIS — Y9389 Activity, other specified: Secondary | ICD-10-CM | POA: Diagnosis not present

## 2019-09-07 DIAGNOSIS — W2209XA Striking against other stationary object, initial encounter: Secondary | ICD-10-CM | POA: Diagnosis not present

## 2019-09-07 DIAGNOSIS — F17210 Nicotine dependence, cigarettes, uncomplicated: Secondary | ICD-10-CM | POA: Diagnosis not present

## 2019-09-07 DIAGNOSIS — J45909 Unspecified asthma, uncomplicated: Secondary | ICD-10-CM | POA: Diagnosis not present

## 2019-09-07 DIAGNOSIS — Y929 Unspecified place or not applicable: Secondary | ICD-10-CM | POA: Diagnosis not present

## 2019-09-07 MED ORDER — IBUPROFEN 400 MG PO TABS
600.0000 mg | ORAL_TABLET | Freq: Once | ORAL | Status: AC
  Administered 2019-09-07: 600 mg via ORAL
  Filled 2019-09-07: qty 1

## 2019-09-07 NOTE — ED Triage Notes (Signed)
Pt arrives POV for eval of L middle finger pain after striking it on a pole at work 1 week ago. Reports swelling improved, but pain is worse. No other known injury.

## 2019-09-07 NOTE — Discharge Instructions (Signed)
You can wear splint for comfort and to help rest ligaments so finger can continue to heal, this can be removed as pain is improving.  You can use Motrin and Tylenol for pain as well as applying ice.  Your x-ray did not show any fractures today.  Follow-up with your primary care doctor if symptoms or not improving.

## 2019-09-07 NOTE — ED Provider Notes (Signed)
[facility] EMERGENCY DEPARTMENT Provider Note   CSN: [id number] Arrival date & time: 09/07/19  1123     History Chief Complaint  Patient presents with  . Hand Pain    Pam Vanalstine is a 29 y.o. female.  Indira Sorenson is a 29 y.o. female with a history of asthma, and depression, who presents to the emergency department for evaluation of finger injury.  She states that a week ago while at work she was trying to throw a package and smacked the back of her left middle finger on a pole.  The finger was quite swollen for 2 days, swelling has improved but she still having some pain primarily with movement of the finger, so came in for evaluation.  She denies any pain or injury to the other fingers.  No redness noted, no wounds from the injury.  She denies any numbness tingling or weakness.        Past Medical History:  Diagnosis Date  . Asthma    prn inhaler  . Closed left radial fracture 01/15/2011  . Depression    hx of, fine now  . Fracture of metatarsal bone of left foot 04/22/2015   5th metatarsal  . Left breast abscess 04/2015    Patient Active Problem List   Diagnosis Date Noted  . Pregnancy of unknown anatomic location 03/17/2019  . Postoperative wound cellulitis 06/02/2017  . GDM (gestational diabetes mellitus), class A1   . Gestational diabetes mellitus (GDM) affecting pregnancy 12/30/2016  . Asthma 10/30/2016  . Rh negative status during pregnancy 10/30/2016  . Supervision of high-risk pregnancy 10/23/2016  . Tobacco abuse 07/14/2012    Past Surgical History:  Procedure Laterality Date  . CESAREAN SECTION N/A 05/08/2017   Procedure: CESAREAN SECTION;  Surgeon: Tilda Burrow, MD;  Location: [facility] BIRTHING SUITES;  Service: Obstetrics;  Laterality: N/A;  . INCISION AND DRAINAGE ABSCESS Left 09/24/2012   Procedure: INCISION AND DRAINAGE BREAST ABSCESS;  Surgeon: Robyne Askew, MD;  [facility]: [facility];  Service: General;  Laterality: Left;  .  INCISION AND DRAINAGE ABSCESS Left 05/08/2015   Procedure: INCISION AND DRAINAGE LEFT BREAST  ABSCESS;  Surgeon: Chevis Pretty III, MD;  Location: [facility];  Service: General;  Laterality: Left;     OB History    Gravida  3   Para  1   Term  1   Preterm      AB  1   Living  1     SAB  1   TAB      Ectopic      Multiple  0   Live Births  1           Family History  Problem Relation Age of Onset  . Diabetes Mother   . Asthma Father     Social History   Tobacco Use  . Smoking status: Current Every Day Smoker    Packs/day: 0.25    Years: 10.00    Pack years: 2.50    Types: Cigarettes  . Smokeless tobacco: Never Used  . Tobacco comment: trying to quit  Substance Use Topics  . Alcohol use: No  . Drug use: No    Home Medications Prior to Admission medications   Medication Sig Start Date End Date Taking? Authorizing Provider  acetaminophen (TYLENOL) 325 MG tablet Take 650 mg by mouth every 6 (six) hours as needed.    [provider]  ibuprofen (ADVIL) 600 MG tablet  Take 1 tablet (600 mg total) by mouth every 6 (six) hours as needed. 03/17/19   Raelyn Mora, CNM    Allergies    Pineapple  Review of Systems   Review of Systems  Constitutional: Negative for chills and fever.  Musculoskeletal: Positive for arthralgias and joint swelling.  Skin: Negative for color change and rash.  Neurological: Negative for weakness and numbness.    Physical Exam Updated Vital Signs BP (!) 142/84 (BP Location: Right Arm)   Pulse 67   Temp 98.4 F (36.9 C) (Oral)   Resp 18   Ht 5\' 2"  (1.575 m)   Wt 94 kg   LMP 02/10/2019   SpO2 97%   BMI 37.90 kg/m   Physical Exam Vitals and nursing note reviewed.  Constitutional:      General: She is not in acute distress.    Appearance: She is well-developed. She is not diaphoretic.  HENT:     Head: Normocephalic and atraumatic.  Eyes:     General:        Right eye: No discharge.        Left  eye: No discharge.  Pulmonary:     Effort: Pulmonary effort is normal. No respiratory distress.  Musculoskeletal:     Comments: Tenderness and slight swelling noted over the left middle finger without obvious deformity, no overlying erythema, warmth or wounds.  Patient has full range of motion with minimal discomfort, normal sensation and strength with flexion and extension, 2+ radial pulse and good cap refill, no tenderness or pain over any of the other fingers or hand.  Neurological:     Mental Status: She is alert.     Coordination: Coordination normal.  Psychiatric:        Behavior: Behavior normal.     ED Results / Procedures / Treatments   Labs (all labs ordered are listed, but only abnormal results are displayed) Labs Reviewed - No data to display  EKG None  Radiology DG Hand Complete Left  Result Date: 09/07/2019 CLINICAL DATA:  Left long finger injury, pain EXAM: LEFT HAND - COMPLETE 3+ VIEW COMPARISON:  08/26/2011 FINDINGS: There is no evidence of fracture or dislocation. There is no evidence of arthropathy or other focal bone abnormality. Soft tissues are unremarkable. IMPRESSION: Negative. Electronically Signed   By: 10/26/2011 D.O.   On: 09/07/2019 13:08    Procedures Procedures (including critical care time)  Medications Ordered in ED Medications  ibuprofen (ADVIL) tablet 600 mg (has no administration in time range)    ED Course  I have reviewed the triage vital signs and the nursing notes.  Pertinent labs & imaging results that were available during my care of the patient were reviewed by me and considered in my medical decision making (see chart for details).    MDM Rules/Calculators/A&P                          29 year old female presents with left middle finger injury which occurred a week ago when she struck her finger on a metal pole, swelling has improved but she is continued to have some pain.  Finger is neurovascularly intact, there is no signs  of overlying infection.  X-ray is negative.  Suspect mild single sprain, will place in splint for comfort and to help reduce range of motion well-healing and encourage Motrin, Tylenol and ice.  PCP follow-up if not improving.  Patient expresses understanding and agreement.  Discharged home  in good condition.  Final Clinical Impression(s) / ED Diagnoses Final diagnoses:  Injury of finger of left hand, initial encounter    Rx / DC Orders ED Discharge Orders    None       Legrand Rams 09/07/19 1352    Eber Hong, MD 09/08/19 [phone]

## 2019-09-07 NOTE — ED Notes (Signed)
Patient verbalizes understanding of discharge instructions. Opportunity for questioning and answers were provided. Arm band removed by staff, patient discharged from ED. 

## 2019-09-14 ENCOUNTER — Inpatient Hospital Stay (HOSPITAL_COMMUNITY)
Admission: AD | Admit: 2019-09-14 | Discharge: 2019-09-14 | Disposition: A | Discharge status: Home / Self Care | Payer: BC Managed Care – PPO | Attending: Obstetrics and Gynecology | Admitting: Obstetrics and Gynecology

## 2019-09-14 ENCOUNTER — Inpatient Hospital Stay (HOSPITAL_COMMUNITY): Payer: BC Managed Care – PPO

## 2019-09-14 ENCOUNTER — Encounter (HOSPITAL_COMMUNITY): Payer: Self-pay | Admitting: *Deleted

## 2019-09-14 DIAGNOSIS — Z3A08 8 weeks gestation of pregnancy: Secondary | ICD-10-CM | POA: Diagnosis not present

## 2019-09-14 DIAGNOSIS — R109 Unspecified abdominal pain: Secondary | ICD-10-CM

## 2019-09-14 DIAGNOSIS — O99511 Diseases of the respiratory system complicating pregnancy, first trimester: Secondary | ICD-10-CM | POA: Insufficient documentation

## 2019-09-14 DIAGNOSIS — R1030 Lower abdominal pain, unspecified: Secondary | ICD-10-CM | POA: Insufficient documentation

## 2019-09-14 DIAGNOSIS — F17210 Nicotine dependence, cigarettes, uncomplicated: Secondary | ICD-10-CM | POA: Insufficient documentation

## 2019-09-14 DIAGNOSIS — M549 Dorsalgia, unspecified: Secondary | ICD-10-CM | POA: Diagnosis not present

## 2019-09-14 DIAGNOSIS — O99891 Other specified diseases and conditions complicating pregnancy: Secondary | ICD-10-CM | POA: Diagnosis not present

## 2019-09-14 DIAGNOSIS — O26899 Other specified pregnancy related conditions, unspecified trimester: Secondary | ICD-10-CM

## 2019-09-14 DIAGNOSIS — O99331 Smoking (tobacco) complicating pregnancy, first trimester: Secondary | ICD-10-CM | POA: Diagnosis not present

## 2019-09-14 DIAGNOSIS — O26891 Other specified pregnancy related conditions, first trimester: Secondary | ICD-10-CM | POA: Insufficient documentation

## 2019-09-14 DIAGNOSIS — J45909 Unspecified asthma, uncomplicated: Secondary | ICD-10-CM | POA: Insufficient documentation

## 2019-09-14 DIAGNOSIS — O3680X0 Pregnancy with inconclusive fetal viability, not applicable or unspecified: Secondary | ICD-10-CM

## 2019-09-14 DIAGNOSIS — Z791 Long term (current) use of non-steroidal anti-inflammatories (NSAID): Secondary | ICD-10-CM | POA: Insufficient documentation

## 2019-09-14 LAB — CBC
HCT: 42.2 % (ref 36.0–46.0)
Hemoglobin: 13.9 g/dL (ref 12.0–15.0)
MCH: 31.8 pg (ref 26.0–34.0)
MCHC: 32.9 g/dL (ref 30.0–36.0)
MCV: 96.6 fL (ref 80.0–100.0)
Platelets: 272 10*3/uL (ref 150–400)
RBC: 4.37 MIL/uL (ref 3.87–5.11)
RDW: 13 % (ref 11.5–15.5)
WBC: 6.8 10*3/uL (ref 4.0–10.5)
nRBC: 0 % (ref 0.0–0.2)

## 2019-09-14 LAB — URINALYSIS, ROUTINE W REFLEX MICROSCOPIC
Bilirubin Urine: NEGATIVE
Glucose, UA: NEGATIVE mg/dL
Hgb urine dipstick: NEGATIVE
Ketones, ur: 5 mg/dL — AB
Leukocytes,Ua: NEGATIVE
Nitrite: NEGATIVE
Protein, ur: NEGATIVE mg/dL
Specific Gravity, Urine: 1.026 (ref 1.005–1.030)
pH: 5 (ref 5.0–8.0)

## 2019-09-14 LAB — POCT PREGNANCY, URINE: Preg Test, Ur: POSITIVE — AB

## 2019-09-14 LAB — WET PREP, GENITAL
Sperm: NONE SEEN
Trich, Wet Prep: NONE SEEN
Yeast Wet Prep HPF POC: NONE SEEN

## 2019-09-14 LAB — HCG, QUANTITATIVE, PREGNANCY: hCG, Beta Chain, Quant, S: 15984 m[IU]/mL — ABNORMAL HIGH (ref <5)

## 2019-09-14 NOTE — Discharge Instructions (Signed)
Abdominal Pain During Pregnancy  Belly (abdominal) pain is common during pregnancy. There are many possible causes. Most of the time, it is not a serious problem. Other times, it can be a sign that something is wrong with the pregnancy. Always tell your doctor if you have belly pain. Follow these instructions at home:  Do not have sex or put anything in your vagina until your pain goes away completely.  Get plenty of rest until your pain gets better.  Drink enough fluid to keep your pee (urine) pale yellow.  Take over-the-counter and prescription medicines only as told by your doctor.  Keep all follow-up visits as told by your doctor. This is important. Contact a doctor if:  Your pain continues or gets worse after resting.  You have lower belly pain that: ? Comes and goes at regular times. ? Spreads to your back. ? Feels like menstrual cramps.  You have pain or burning when you pee (urinate). Get help right away if:  You have a fever or chills.  You have vaginal bleeding.  You are leaking fluid from your vagina.  You are passing tissue from your vagina.  You throw up (vomit) for more than 24 hours.  You have watery poop (diarrhea) for more than 24 hours.  Your baby is moving less than usual.  You feel very weak or faint.  You have shortness of breath.  You have very bad pain in your upper belly. Summary  Belly (abdominal) pain is common during pregnancy. There are many possible causes.  If you have belly pain during pregnancy, tell your doctor right away.  Keep all follow-up visits as told by your doctor. This is important. This information is not intended to replace advice given to you by your health care provider. Make sure you discuss any questions you have with your health care provider. Document Revised: 04/20/2018 Document Reviewed: 04/04/2016 Elsevier Patient Education  2020 Elsevier Inc.  

## 2019-09-14 NOTE — MAU Provider Note (Signed)
History     CSN: [id number]  Arrival date and time: 09/14/19 5361   First Provider Initiated Contact with Patient 09/14/19 1031      Chief Complaint  Patient presents with  . Abdominal Pain  . Back Pain  . Possible Pregnancy   29 y.o. [id number] @[redacted]w[redacted]d  by unsure LMP presenting with LAP. Reports onset 3 days ago. Describes as cramping. Rates pain 7/10. Has not tried anything for it. Denies VB or discharge. No fevers. Denies urinary sx.   OB History    Gravida  3   Para  1   Term  1   Preterm      AB  1   Living  1     SAB  1   TAB      Ectopic      Multiple  0   Live Births  1        Obstetric Comments  C/s for FTP, got to 5cm         Past Medical History:  Diagnosis Date  . Asthma    prn inhaler  . Closed left radial fracture 01/15/2011  . Depression    hx of, fine now  . Fracture of metatarsal bone of left foot 04/22/2015   5th metatarsal  . Left breast abscess 04/2015    Past Surgical History:  Procedure Laterality Date  . CESAREAN SECTION N/A 05/08/2017   Procedure: CESAREAN SECTION;  Surgeon: 05/10/2017, MD;  Location: [facility] BIRTHING SUITES;  Service: Obstetrics;  Laterality: N/A;  . INCISION AND DRAINAGE ABSCESS Left 09/24/2012   Procedure: INCISION AND DRAINAGE BREAST ABSCESS;  Surgeon: 11/24/2012, MD;  Location: [facility];  Service: General;  Laterality: Left;  . INCISION AND DRAINAGE ABSCESS Left 05/08/2015   Procedure: INCISION AND DRAINAGE LEFT BREAST  ABSCESS;  Surgeon: 05/10/2015 III, MD;  Location: [facility];  Service: General;  Laterality: Left;    Family History  Problem Relation Age of Onset  . Diabetes Mother   . Stroke Mother   . Asthma Father     Social History   Tobacco Use  . Smoking status: Current Every Day Smoker    Packs/day: 0.25    Years: 10.00    Pack years: 2.50    Types: Cigarettes  . Smokeless tobacco: Never Used  . Tobacco comment: trying to quit  Vaping Use  . Vaping Use: Never used   Substance Use Topics  . Alcohol use: No  . Drug use: No    Allergies:  Allergies  Allergen Reactions  . Pineapple Shortness Of Breath, Itching and Swelling    Medications Prior to Admission  Medication Sig Dispense Refill Last Dose  . acetaminophen (TYLENOL) 325 MG tablet Take 650 mg by mouth every 6 (six) hours as needed.   Past Month at Unknown time  . ibuprofen (ADVIL) 600 MG tablet Take 1 tablet (600 mg total) by mouth every 6 (six) hours as needed. 30 tablet 0 Past Month at Unknown time    Review of Systems  Constitutional: Negative for chills and fever.  Gastrointestinal: Positive for abdominal pain. Negative for constipation, diarrhea, nausea and vomiting.  Genitourinary: Negative for dysuria, frequency, hematuria, urgency, vaginal bleeding and vaginal discharge.  Musculoskeletal: Positive for back pain.   Physical Exam   Blood pressure 118/69, pulse 70, temperature 98.4 F (36.9 C), resp. rate 18, height 5\' 2"  (1.575 m), weight 93.4 kg, last menstrual period 07/15/2019, SpO2 99 %, unknown  if currently breastfeeding.  Physical Exam Vitals and nursing note reviewed. Exam conducted with a chaperone present.  Constitutional:      General: She is not in acute distress.    Appearance: She is well-developed.  HENT:     Head: Normocephalic and atraumatic.  Cardiovascular:     Rate and Rhythm: Normal rate.  Pulmonary:     Effort: Pulmonary effort is normal. No respiratory distress.  Abdominal:     General: There is no distension.     Palpations: Abdomen is soft. There is no mass.     Tenderness: There is no abdominal tenderness. There is no rebound.  Genitourinary:    Comments: External: no lesions or erythema Uterus: non enlarged, anteverted, non tender, no CMT Adnexae: no masses, no tenderness left, no tenderness right Cervix closed  Musculoskeletal:        General: Normal range of motion.  Skin:    General: Skin is warm and dry.  Neurological:     General: No  focal deficit present.     Mental Status: She is alert and oriented to person, place, and time.  Psychiatric:        Mood and Affect: Mood normal.    Results for orders placed or performed during the hospital encounter of 09/14/19 (from the past 24 hour(s))  Pregnancy, urine POC     Status: Abnormal   Collection Time: 09/14/19  9:57 AM  Result Value Ref Range   Preg Test, Ur POSITIVE (A) NEGATIVE  Urinalysis, Routine w reflex microscopic Urine, Clean Catch     Status: Abnormal   Collection Time: 09/14/19  9:59 AM  Result Value Ref Range   Color, Urine YELLOW YELLOW   APPearance CLEAR CLEAR   Specific Gravity, Urine 1.026 1.005 - 1.030   pH 5.0 5.0 - 8.0   Glucose, UA NEGATIVE NEGATIVE mg/dL   Hgb urine dipstick NEGATIVE NEGATIVE   Bilirubin Urine NEGATIVE NEGATIVE   Ketones, ur 5 (A) NEGATIVE mg/dL   Protein, ur NEGATIVE NEGATIVE mg/dL   Nitrite NEGATIVE NEGATIVE   Leukocytes,Ua NEGATIVE NEGATIVE  CBC     Status: None   Collection Time: 09/14/19 10:30 AM  Result Value Ref Range   WBC 6.8 4.0 - 10.5 K/uL   RBC 4.37 3.87 - 5.11 MIL/uL   Hemoglobin 13.9 12.0 - 15.0 g/dL   HCT 76.1 36 - 46 %   MCV 96.6 80.0 - 100.0 fL   MCH 31.8 26.0 - 34.0 pg   MCHC 32.9 30.0 - 36.0 g/dL   RDW 60.7 37.1 - 06.2 %   Platelets 272 150 - 400 K/uL   nRBC 0.0 0.0 - 0.2 %  Wet prep, genital     Status: Abnormal   Collection Time: 09/14/19 10:35 AM   Specimen: PATH Cytology Cervicovaginal Ancillary Only  Result Value Ref Range   Yeast Wet Prep HPF POC NONE SEEN NONE SEEN   Trich, Wet Prep NONE SEEN NONE SEEN   Clue Cells Wet Prep HPF POC PRESENT (A) NONE SEEN   WBC, Wet Prep HPF POC FEW (A) NONE SEEN   Sperm NONE SEEN    US OB LESS THAN 14 WEEKS WITH OB TRANSVAGINAL  Result Date: 09/14/2019 CLINICAL DATA:  Cramping.  Positive pregnancy test. EXAM: OBSTETRIC <14 WK [place] AND TRANSVAGINAL OB US TECHNIQUE: Both transabdominal and transvaginal ultrasound examinations were performed for complete  evaluation of the gestation as well as the maternal uterus, adnexal regions, and pelvic cul-de-sac. Transvaginal technique was performed  to assess early pregnancy. COMPARISON:  03/17/2019. FINDINGS: Intrauterine gestational sac: Single Yolk sac:  None visualized Embryo:  None visualized Cardiac Activity: None visualized MSD: 1.1 cm 5 w   5 d Subchorionic hemorrhage:  None visualized Maternal uterus/adnexae: Unremarkable.  No free fluid IMPRESSION: Intrauterine gestational sac measuring 1.1 cm corresponding to 5 weeks 5 days pregnancy. Probable early intrauterine gestational sac, but no yolk sac, fetal pole, or cardiac activity yet visualized. Recommend follow-up quantitative B-HCG levels and follow-up US in 14 days to assess viability. This recommendation follows SRU consensus guidelines: Diagnostic Criteria for Nonviable Pregnancy Early in the First Trimester. Malva Limes Med 2013; 161:0960-45. Electronically Signed   By: Maisie Fus  Register   On: 09/14/2019 11:42   MAU Course  Procedures  MDM Labs and [place] ordered and reviewed. IUGS but no YS or FP seen on [place], findings could indicate early pregnancy, ectopic pregnancy, or failed pregnancy, discussed with pt. Will follow quant in 48 hrs. Stable for discharge home.   Assessment and Plan   1. Pregnancy, location unknown   2. Abdominal pain affecting pregnancy    Discharge home Follow up at [facility] on 09/16/19- scheduled Tylenol/heating pad prn Ectopic/SAB precautions  Allergies as of 09/14/2019      Reactions   Pineapple Shortness Of Breath, Itching, Swelling      Medication List    STOP taking these medications   ibuprofen 600 MG tablet Commonly known as: ADVIL     TAKE these medications   acetaminophen 325 MG tablet Commonly known as: TYLENOL Take 650 mg by mouth every 6 (six) hours as needed.      Donette Larry, CNM 09/14/2019, 11:52 AM

## 2019-09-14 NOTE — MAU Note (Signed)
+  HPT yesterday. Had a failed preg in the Spring.  Has been cramping x2 days.  Knows she needs rhogham,  Has not had any bleeding.

## 2019-09-15 LAB — GC/CHLAMYDIA PROBE AMP (~~LOC~~) NOT AT ARMC
Chlamydia: NEGATIVE
Comment: NEGATIVE
Comment: NORMAL
Neisseria Gonorrhea: NEGATIVE

## 2019-09-16 ENCOUNTER — Ambulatory Visit: Payer: BC Managed Care – PPO

## 2019-09-16 ENCOUNTER — Telehealth: Payer: Self-pay | Admitting: *Deleted

## 2019-09-16 NOTE — Telephone Encounter (Signed)
Called pt regarding missed appt today for stat BHCG. There was no answer and the outgoing message stated "Mary Davies". The mailbox was full and a message could not be left. Pt does not have active MyChart. If pt contacts the office she will be rescheduled for stat BHCG lab draw and nurse visit.

## 2019-10-04 ENCOUNTER — Ambulatory Visit (INDEPENDENT_AMBULATORY_CARE_PROVIDER_SITE_OTHER): Payer: BC Managed Care – PPO | Admitting: *Deleted

## 2019-10-04 ENCOUNTER — Encounter: Payer: Self-pay | Admitting: Obstetrics

## 2019-10-04 ENCOUNTER — Other Ambulatory Visit: Payer: Self-pay

## 2019-10-04 DIAGNOSIS — O3680X0 Pregnancy with inconclusive fetal viability, not applicable or unspecified: Secondary | ICD-10-CM

## 2019-10-04 LAB — BETA HCG QUANT (REF LAB): hCG Quant: 94911 m[IU]/mL

## 2019-10-04 NOTE — Progress Notes (Addendum)
Pt is in office for Stat Hcg today.  Pt denies any bleeding, pain only mild cramping while at work.  Pt advised that Hcg will be drawn today to determine plan. Pt advised to be seen at hospital with any emergent needs.   Pt made aware she will be called with results once they are received and reviewed with provider.   Pt has no other concerns today.      Call placed to pt at 1650, reviewed results with pt. Made aware that per Dr Debroah Loop, an u/s will be ordered for dating and viability.   Order has been entered and pt made aware she will be contacted regarding appt.

## 2019-10-04 NOTE — Addendum Note (Signed)
Addended by: Marya Landry D on: 10/04/2019 04:52 PM   Modules accepted: Orders

## 2019-10-05 NOTE — Progress Notes (Signed)
Patient ID: Mary Davies, female   DOB: [birth year], 29 y.o.   MRN: [id number] Patient was assessed and managed by nursing staff during this encounter. I have reviewed the chart and agree with the documentation and plan. I have also made any necessary editorial changes.  Scheryl Darter, MD 10/05/2019 10:09 AM

## 2019-10-12 ENCOUNTER — Other Ambulatory Visit: Payer: Self-pay | Admitting: Obstetrics & Gynecology

## 2019-10-12 ENCOUNTER — Ambulatory Visit
Admission: RE | Admit: 2019-10-12 | Discharge: 2019-10-12 | Disposition: A | Discharge status: Home / Self Care | Payer: BC Managed Care – PPO | Source: Ambulatory Visit | Attending: Obstetrics & Gynecology | Admitting: Obstetrics & Gynecology

## 2019-10-12 ENCOUNTER — Other Ambulatory Visit: Payer: Self-pay

## 2019-10-12 DIAGNOSIS — O3680X0 Pregnancy with inconclusive fetal viability, not applicable or unspecified: Secondary | ICD-10-CM

## 2019-10-25 ENCOUNTER — Telehealth: Payer: Self-pay

## 2019-10-25 NOTE — Telephone Encounter (Signed)
Pt called and left a voicemail on the nurse triage line requesting a call back. Called pt back x2, no answer- LVM for her to call back.

## 2019-11-11 ENCOUNTER — Ambulatory Visit: Payer: BC Managed Care – PPO

## 2019-11-11 NOTE — Progress Notes (Signed)
Called pt for NOB intake, no answer, left vm. 

## 2019-11-18 ENCOUNTER — Other Ambulatory Visit: Payer: Self-pay

## 2019-11-18 ENCOUNTER — Other Ambulatory Visit (HOSPITAL_COMMUNITY)
Admission: RE | Admit: 2019-11-18 | Discharge: 2019-11-18 | Disposition: A | Discharge status: Home / Self Care | Payer: BC Managed Care – PPO | Source: Ambulatory Visit | Attending: Obstetrics and Gynecology | Admitting: Obstetrics and Gynecology

## 2019-11-18 ENCOUNTER — Ambulatory Visit (INDEPENDENT_AMBULATORY_CARE_PROVIDER_SITE_OTHER): Payer: BC Managed Care – PPO | Admitting: Obstetrics and Gynecology

## 2019-11-18 ENCOUNTER — Encounter: Payer: BC Managed Care – PPO | Admitting: Obstetrics and Gynecology

## 2019-11-18 ENCOUNTER — Encounter: Payer: Self-pay | Admitting: Obstetrics and Gynecology

## 2019-11-18 VITALS — BP 108/66 | HR 75 | Wt 211.0 lb

## 2019-11-18 DIAGNOSIS — R87612 Low grade squamous intraepithelial lesion on cytologic smear of cervix (LGSIL): Secondary | ICD-10-CM

## 2019-11-18 DIAGNOSIS — O0990 Supervision of high risk pregnancy, unspecified, unspecified trimester: Secondary | ICD-10-CM | POA: Diagnosis not present

## 2019-11-18 DIAGNOSIS — O09299 Supervision of pregnancy with other poor reproductive or obstetric history, unspecified trimester: Secondary | ICD-10-CM

## 2019-11-18 DIAGNOSIS — O0992 Supervision of high risk pregnancy, unspecified, second trimester: Secondary | ICD-10-CM

## 2019-11-18 DIAGNOSIS — Z3A14 14 weeks gestation of pregnancy: Secondary | ICD-10-CM | POA: Diagnosis not present

## 2019-11-18 DIAGNOSIS — Z8632 Personal history of gestational diabetes: Secondary | ICD-10-CM

## 2019-11-18 DIAGNOSIS — O99212 Obesity complicating pregnancy, second trimester: Secondary | ICD-10-CM

## 2019-11-18 NOTE — Progress Notes (Signed)
History:   Mary Davies is a 29 y.o. G3P1011 at [redacted]w[redacted]d by early ultrasound being seen today for her first obstetrical visit.  Her obstetrical history is significant for obesity and gestational DM. Patient does not intend to breast feed. Pregnancy history fully reviewed. Previous C/S.   Patient reports no complaints. Normal 2 hour PP with previous pregnancy.   HISTORY: OB History  Gravida Para Term Preterm AB Living  3 1 1  0 1 1  SAB TAB Ectopic Multiple Live Births  1 0 0 0 1    # Outcome Date GA Lbr Len/2nd Weight Sex Delivery Anes PTL Lv  3 Current           2 SAB 03/2019          1 Term 05/08/17 [redacted]w[redacted]d  5 lb 3.8 oz (2.375 kg) F CS-LTranv EPI  LIV     Name: [redacted]w[redacted]d     Apgar1: 8  Apgar5: 9    Obstetric Comments  C/s for FTP, got to 5cm    Last pap smear was done 2021 and was abnormal - LSIL  Past Medical History:  Diagnosis Date  . Asthma    prn inhaler  . Closed left radial fracture 01/15/2011  . Depression    hx of, fine now  . Fracture of metatarsal bone of left foot 04/22/2015   5th metatarsal  . Left breast abscess 04/2015   Past Surgical History:  Procedure Laterality Date  . CESAREAN SECTION N/A 05/08/2017   Procedure: CESAREAN SECTION;  Surgeon: 05/10/2017, MD;  Location: [facility] BIRTHING SUITES;  Service: Obstetrics;  Laterality: N/A;  . INCISION AND DRAINAGE ABSCESS Left 09/24/2012   Procedure: INCISION AND DRAINAGE BREAST ABSCESS;  Surgeon: 11/24/2012, MD;  Location: [facility] OR;  Service: General;  Laterality: Left;  . INCISION AND DRAINAGE ABSCESS Left 05/08/2015   Procedure: INCISION AND DRAINAGE LEFT BREAST  ABSCESS;  Surgeon: 05/10/2015 III, MD;  Location: [facility];  Service: General;  Laterality: Left;   Family History  Problem Relation Age of Onset  . Diabetes Mother   . Stroke Mother   . Asthma Father    Social History   Tobacco Use  . Smoking status: Former Smoker    Packs/day: 0.25    Years: 10.00     Pack years: 2.50    Types: Cigarettes  . Smokeless tobacco: Never Used  . Tobacco comment: trying to quit  Vaping Use  . Vaping Use: Never used  Substance Use Topics  . Alcohol use: No  . Drug use: No   Allergies  Allergen Reactions  . Pineapple Shortness Of Breath, Itching and Swelling   Current Outpatient Medications on File Prior to Visit  Medication Sig Dispense Refill  . acetaminophen (TYLENOL) 325 MG tablet Take 650 mg by mouth every 6 (six) hours as needed.     No current facility-administered medications on file prior to visit.    Review of Systems Pertinent items noted in HPI and remainder of comprehensive ROS otherwise negative. Physical Exam:   Vitals:   11/18/19 1320  BP: 108/66  Pulse: 75  Weight: 211 lb (95.7 kg)    System: General: well-developed, well-nourished female in no acute distress   Skin: normal coloration and turgor, no rashes   Neurologic: oriented, normal, negative, normal mood   Extremities: normal strength, tone, and muscle mass, ROM of all joints is normal   HEENT PERRLA, extraocular movement intact and sclera clear,  anicteric   Mouth/Teeth mucous membranes moist, pharynx normal without lesions and dental hygiene good   Neck supple and no masses   Cardiovascular: regular rate and rhythm   Respiratory:  no respiratory distress, normal breath sounds   Abdomen: soft, non-tender; bowel sounds normal; no masses,  no organomegaly    Assessment:    Pregnancy: G3P1011 Patient Active Problem List   Diagnosis Date Noted  . Supervision of high risk pregnancy, antepartum 11/18/2019  . Postoperative wound cellulitis 06/02/2017  . Asthma 10/30/2016  . Rh negative status during pregnancy 10/30/2016  . Tobacco abuse 07/14/2012     Plan:    1. Supervision of high risk pregnancy, antepartum - Culture, OB Urine - CBC/D/Plt+RPR+Rh+ABO+Rub Ab... - [place] MFM OB COMP + 14 WK; Future - Hemoglobin A1c - Urine cytology ancillary only([place]) -  ToxASSURE Select 13 (MW), Urine - BASA daily starting now    Initial labs drawn. Continue prenatal vitamins. Problem list reviewed and updated. Genetic Screening discussed, First trimester screen, Quad screen and NIPS: declined. Ultrasound discussed; fetal anatomic survey: ordered. Anticipatory guidance about prenatal visits given including labs, ultrasounds, and testing. Discussed usage of Babyscripts and virtual visits as additional source of managing and completing prenatal visits in midst of coronavirus and pandemic.   Encouraged to complete MyChart Registration for her ability to review results, send requests, and have questions addressed.  The nature of [place] [facility]/Faculty Practice with multiple MDs and Advanced Practice Providers was explained to patient; also emphasized that residents, students are part of our team. Routine obstetric precautions reviewed. Encouraged to seek out care at office or emergency room [facility] MAU preferred) for urgent and/or emergent concerns. No follow-ups on file.    Tatjana Turcott, Harolyn Rutherford, NP  Faculty Practice Center for [employer], [facility]

## 2019-11-18 NOTE — Progress Notes (Signed)
Pt works at [employer] and is thinking of starting her LOA.  Advised to discuss with provider and obtain paperwork from employer.  Pt has hx of GDM and Poly.

## 2019-11-19 LAB — URINE CYTOLOGY ANCILLARY ONLY
Chlamydia: NEGATIVE
Comment: NEGATIVE
Comment: NORMAL
Neisseria Gonorrhea: NEGATIVE

## 2019-11-19 LAB — CBC/D/PLT+RPR+RH+ABO+RUB AB...
Antibody Screen: NEGATIVE
Basophils Absolute: 0 10*3/uL (ref 0.0–0.2)
Basos: 0 %
EOS (ABSOLUTE): 0.1 10*3/uL (ref 0.0–0.4)
Eos: 2 %
HCV Ab: 0.1 s/co ratio (ref 0.0–0.9)
HIV Screen 4th Generation wRfx: NONREACTIVE
Hematocrit: 38.8 % (ref 34.0–46.6)
Hemoglobin: 12.9 g/dL (ref 11.1–15.9)
Hepatitis B Surface Ag: NEGATIVE
Immature Grans (Abs): 0 10*3/uL (ref 0.0–0.1)
Immature Granulocytes: 0 %
Lymphocytes Absolute: 1.4 10*3/uL (ref 0.7–3.1)
Lymphs: 20 %
MCH: 32.4 pg (ref 26.6–33.0)
MCHC: 33.2 g/dL (ref 31.5–35.7)
MCV: 98 fL — ABNORMAL HIGH (ref 79–97)
Monocytes Absolute: 0.6 10*3/uL (ref 0.1–0.9)
Monocytes: 9 %
Neutrophils Absolute: 4.8 10*3/uL (ref 1.4–7.0)
Neutrophils: 69 %
Platelets: 270 10*3/uL (ref 150–450)
RBC: 3.98 x10E6/uL (ref 3.77–5.28)
RDW: 12.5 % (ref 11.7–15.4)
RPR Ser Ql: NONREACTIVE
Rh Factor: NEGATIVE
Rubella Antibodies, IGG: 6.39 index (ref >0.99)
WBC: 7 10*3/uL (ref 3.4–10.8)

## 2019-11-19 LAB — HEMOGLOBIN A1C
Est. average glucose Bld gHb Est-mCnc: 105 mg/dL
Hgb A1c MFr Bld: 5.3 % (ref 4.8–5.6)

## 2019-11-19 LAB — HCV INTERPRETATION

## 2019-11-20 LAB — URINE CULTURE, OB REFLEX: Organism ID, Bacteria: NO GROWTH

## 2019-11-20 LAB — CULTURE, OB URINE

## 2019-11-23 LAB — TOXASSURE SELECT 13 (MW), URINE

## 2019-11-24 DIAGNOSIS — O99210 Obesity complicating pregnancy, unspecified trimester: Secondary | ICD-10-CM | POA: Insufficient documentation

## 2019-11-24 DIAGNOSIS — O09299 Supervision of pregnancy with other poor reproductive or obstetric history, unspecified trimester: Secondary | ICD-10-CM | POA: Insufficient documentation

## 2019-11-24 DIAGNOSIS — R87619 Unspecified abnormal cytological findings in specimens from cervix uteri: Secondary | ICD-10-CM | POA: Insufficient documentation

## 2019-12-16 ENCOUNTER — Ambulatory Visit (INDEPENDENT_AMBULATORY_CARE_PROVIDER_SITE_OTHER): Payer: BC Managed Care – PPO | Admitting: Obstetrics and Gynecology

## 2019-12-16 ENCOUNTER — Other Ambulatory Visit: Payer: Self-pay

## 2019-12-16 VITALS — BP 108/71 | HR 80 | Wt 213.0 lb

## 2019-12-16 DIAGNOSIS — O0990 Supervision of high risk pregnancy, unspecified, unspecified trimester: Secondary | ICD-10-CM

## 2019-12-16 DIAGNOSIS — R825 Elevated urine levels of drugs, medicaments and biological substances: Secondary | ICD-10-CM | POA: Insufficient documentation

## 2019-12-16 DIAGNOSIS — R87612 Low grade squamous intraepithelial lesion on cytologic smear of cervix (LGSIL): Secondary | ICD-10-CM

## 2019-12-16 NOTE — Progress Notes (Signed)
   PRENATAL VISIT NOTE  Subjective:  Mary Davies is a 29 y.o. G3P1011 at [redacted]w[redacted]d being seen today for ongoing prenatal care.  She is currently monitored for the following issues for this low-risk pregnancy and has Tobacco abuse; Asthma; Rh negative status during pregnancy; Postoperative wound cellulitis; Supervision of high risk pregnancy, antepartum; Obesity affecting pregnancy; Abnormal Pap smear of cervix; History of gestational diabetes in prior pregnancy, currently pregnant; and Positive urine drug screen on their problem list.  Patient reports no complaints.  Contractions: Not present. Vag. Bleeding: None.  Movement: Present. Denies leaking of fluid.   The following portions of the patient's history were reviewed and updated as appropriate: allergies, current medications, past family history, past medical history, past social history, past surgical history and problem list.   Objective:   Vitals:   12/16/19 1113  BP: 108/71  Pulse: 80  Weight: 213 lb (96.6 kg)    Fetal Status: Fetal Heart Rate (bpm): 155   Movement: Present     General:  Alert, oriented and cooperative. Patient is in no acute distress.  Skin: Skin is warm and dry. No rash noted.   Cardiovascular: Normal heart rate noted  Respiratory: Normal respiratory effort, no problems with respiration noted  Abdomen: Soft, gravid, appropriate for gestational age.  Pain/Pressure: Absent     Pelvic: Cervical exam deferred        Extremities: Normal range of motion.     Mental Status: Normal mood and affect. Normal behavior. Normal judgment and thought content.   Assessment and Plan:  Pregnancy: G3P1011 at [redacted]w[redacted]d  1. Positive urine drug screen  Discussed THC with cocaine. Patient admits to previously using THC, none now. Was not aware that the [facility] was laced with cocaine. Questions answered.    2. Supervision of high risk pregnancy, antepartum  Doing well No concerns.  Patient declined flu shot.  Pap shows LSIL-  needs colpo   Preterm labor symptoms and general obstetric precautions including but not limited to vaginal bleeding, contractions, leaking of fluid and fetal movement were reviewed in detail with the patient. Please refer to After Visit Summary for other counseling recommendations.   Return Needs Colpo.  Future Appointments  Date Time Provider Department Center  12/23/2019  9:45 AM [facility] US4 [facility]  01/13/2020  1:20 PM Marsala, Arlana Pouch, MD [facility] None    Venia Carbon, NP

## 2019-12-23 ENCOUNTER — Ambulatory Visit: Payer: BC Managed Care – PPO | Attending: Obstetrics and Gynecology

## 2019-12-23 ENCOUNTER — Other Ambulatory Visit: Payer: Self-pay | Admitting: *Deleted

## 2019-12-23 ENCOUNTER — Telehealth: Payer: Self-pay

## 2019-12-23 ENCOUNTER — Other Ambulatory Visit: Payer: Self-pay

## 2019-12-23 ENCOUNTER — Other Ambulatory Visit: Payer: Self-pay | Admitting: Obstetrics and Gynecology

## 2019-12-23 DIAGNOSIS — O0990 Supervision of high risk pregnancy, unspecified, unspecified trimester: Secondary | ICD-10-CM | POA: Diagnosis not present

## 2019-12-23 DIAGNOSIS — R638 Other symptoms and signs concerning food and fluid intake: Secondary | ICD-10-CM

## 2019-12-23 NOTE — Telephone Encounter (Signed)
LVMM x3 for patient to call our Office in regards to her FMLA paperwork.

## 2019-12-28 NOTE — Telephone Encounter (Signed)
LVM for pt to c/b regarding FMLA paperwork

## 2020-01-04 ENCOUNTER — Telehealth: Payer: Self-pay

## 2020-01-04 NOTE — Telephone Encounter (Signed)
LM on VM for patient to return call to discuss FMLA paperwork

## 2020-01-13 ENCOUNTER — Ambulatory Visit (INDEPENDENT_AMBULATORY_CARE_PROVIDER_SITE_OTHER): Payer: BC Managed Care – PPO | Admitting: Nurse Practitioner

## 2020-01-13 ENCOUNTER — Other Ambulatory Visit: Payer: Self-pay

## 2020-01-13 ENCOUNTER — Encounter: Payer: Self-pay | Admitting: Nurse Practitioner

## 2020-01-13 VITALS — BP 109/72 | HR 87 | Wt 214.2 lb

## 2020-01-13 DIAGNOSIS — R87612 Low grade squamous intraepithelial lesion on cytologic smear of cervix (LGSIL): Secondary | ICD-10-CM

## 2020-01-13 DIAGNOSIS — Z3A22 22 weeks gestation of pregnancy: Secondary | ICD-10-CM

## 2020-01-13 DIAGNOSIS — Z87891 Personal history of nicotine dependence: Secondary | ICD-10-CM

## 2020-01-13 DIAGNOSIS — R825 Elevated urine levels of drugs, medicaments and biological substances: Secondary | ICD-10-CM

## 2020-01-13 DIAGNOSIS — O99212 Obesity complicating pregnancy, second trimester: Secondary | ICD-10-CM

## 2020-01-13 DIAGNOSIS — O0990 Supervision of high risk pregnancy, unspecified, unspecified trimester: Secondary | ICD-10-CM

## 2020-01-13 NOTE — Progress Notes (Signed)
Patient presents for ROB and colpo due to LGSIL on 02/02/19. Patient has no other concerns today.

## 2020-01-13 NOTE — Progress Notes (Signed)
    Subjective:  Mary Davies is a 29 y.o. G3P1011 at [redacted]w[redacted]d being seen today for ongoing prenatal care.  She is currently monitored for the following issues for this low-risk pregnancy and has Tobacco abuse; Asthma; Rh negative status during pregnancy; Postoperative wound cellulitis; Supervision of high risk pregnancy, antepartum; Obesity affecting pregnancy; Abnormal Pap smear of cervix; History of gestational diabetes in prior pregnancy, currently pregnant; and Positive urine drug screen on their problem list.  Patient reports no complaints.  Contractions: Not present. Vag. Bleeding: None.  Movement: Present. Denies leaking of fluid.   The following portions of the patient's history were reviewed and updated as appropriate: allergies, current medications, past family history, past medical history, past social history, past surgical history and problem list. Problem list updated.  Objective:   Vitals:   01/13/20 1343  BP: 109/72  Pulse: 87  Weight: 214 lb 3.2 oz (97.2 kg)    Fetal Status: Fetal Heart Rate (bpm): 152 Fundal Height: 24 cm Movement: Present     General:  Alert, oriented and cooperative. Patient is in no acute distress.  Skin: Skin is warm and dry. No rash noted.   Cardiovascular: Normal heart rate noted  Respiratory: Normal respiratory effort, no problems with respiration noted  Abdomen: Soft, gravid, appropriate for gestational age. Pain/Pressure: Absent     Pelvic:  Cervical exam deferred        Extremities: Normal range of motion.  Edema: None  Mental Status: Normal mood and affect. Normal behavior. Normal judgment and thought content.   Urinalysis:      Assessment and Plan:  Pregnancy: G3P1011 at [redacted]w[redacted]d  1. Supervision of high risk pregnancy, antepartum Is taking baby aspirin daily  2. Low grade squamous intraepithelial lesion on cytologic smear of cervix (LGSIL) Pap was done in Jan. 2021 - client needs colpo but pap is too far out to schedule colpo.  Will  plan to repeat pap postpartum  3. Obesity affecting pregnancy in second trimester BMI 39 prepregnancy  4. Positive urine drug screen Reviewed the importance of not using cocaine - risk of placental abruption, Hemorrhage, death of mother and death of baby.  Client states she did not use intentionally but was with the wrong people. Advised she can be referred to [facility] provider in the office - she declines.  Advised this service is available in the office during pregnancy and postpartum if her symptoms worsen.  5. Tobacco abuse Stopped smoking in November 2021.  Advised that stopping smoking may help pap improve also.   Preterm labor symptoms and general obstetric precautions including but not limited to vaginal bleeding, contractions, leaking of fluid and fetal movement were reviewed in detail with the patient. Please refer to After Visit Summary for other counseling recommendations.  Return in about 3 weeks (around 02/03/2020) for ROB.  Nolene Bernheim, RN, MSN, NP-BC Nurse Practitioner, [facility] [employer], [facility] 01/13/2020 2:09 PM

## 2020-01-18 ENCOUNTER — Telehealth: Payer: Self-pay | Admitting: *Deleted

## 2020-01-18 NOTE — Telephone Encounter (Signed)
Attempt to contact pt regarding Disability form that is in office to complete.   No answer, LM on VM to call and discuss.

## 2020-01-20 ENCOUNTER — Ambulatory Visit: Payer: BC Managed Care – PPO

## 2020-01-20 ENCOUNTER — Ambulatory Visit (HOSPITAL_BASED_OUTPATIENT_CLINIC_OR_DEPARTMENT_OTHER): Payer: BC Managed Care – PPO

## 2020-01-20 ENCOUNTER — Encounter: Payer: Self-pay | Admitting: *Deleted

## 2020-01-20 ENCOUNTER — Other Ambulatory Visit: Payer: Self-pay

## 2020-01-20 ENCOUNTER — Ambulatory Visit: Payer: BC Managed Care – PPO | Attending: Obstetrics and Gynecology | Admitting: *Deleted

## 2020-01-20 DIAGNOSIS — E669 Obesity, unspecified: Secondary | ICD-10-CM | POA: Diagnosis not present

## 2020-01-20 DIAGNOSIS — O99212 Obesity complicating pregnancy, second trimester: Secondary | ICD-10-CM | POA: Insufficient documentation

## 2020-01-20 DIAGNOSIS — Z3A23 23 weeks gestation of pregnancy: Secondary | ICD-10-CM

## 2020-01-20 DIAGNOSIS — O09299 Supervision of pregnancy with other poor reproductive or obstetric history, unspecified trimester: Secondary | ICD-10-CM

## 2020-01-20 DIAGNOSIS — R638 Other symptoms and signs concerning food and fluid intake: Secondary | ICD-10-CM

## 2020-01-20 DIAGNOSIS — O0990 Supervision of high risk pregnancy, unspecified, unspecified trimester: Secondary | ICD-10-CM

## 2020-01-20 DIAGNOSIS — R825 Elevated urine levels of drugs, medicaments and biological substances: Secondary | ICD-10-CM

## 2020-01-25 ENCOUNTER — Ambulatory Visit: Payer: BC Managed Care – PPO

## 2020-02-03 ENCOUNTER — Encounter: Payer: Self-pay | Admitting: Obstetrics and Gynecology

## 2020-02-03 ENCOUNTER — Encounter: Payer: Self-pay | Admitting: Obstetrics

## 2020-02-03 ENCOUNTER — Ambulatory Visit (INDEPENDENT_AMBULATORY_CARE_PROVIDER_SITE_OTHER): Payer: BC Managed Care – PPO | Admitting: Obstetrics and Gynecology

## 2020-02-03 DIAGNOSIS — O0990 Supervision of high risk pregnancy, unspecified, unspecified trimester: Secondary | ICD-10-CM

## 2020-02-03 DIAGNOSIS — O99212 Obesity complicating pregnancy, second trimester: Secondary | ICD-10-CM

## 2020-02-03 DIAGNOSIS — E669 Obesity, unspecified: Secondary | ICD-10-CM

## 2020-02-03 DIAGNOSIS — Z6791 Unspecified blood type, Rh negative: Secondary | ICD-10-CM

## 2020-02-03 DIAGNOSIS — O360120 Maternal care for anti-D [Rh] antibodies, second trimester, not applicable or unspecified: Secondary | ICD-10-CM

## 2020-02-03 DIAGNOSIS — Z3A25 25 weeks gestation of pregnancy: Secondary | ICD-10-CM

## 2020-02-03 DIAGNOSIS — O26892 Other specified pregnancy related conditions, second trimester: Secondary | ICD-10-CM

## 2020-02-03 NOTE — Progress Notes (Signed)
OBSTETRICS PRENATAL VIRTUAL VISIT ENCOUNTER NOTE  Provider location: [facility] [place]   I connected with [name] on 02/03/20 at 11:00 AM EST by MyChart Video Encounter at home and verified that I am speaking with the correct person using two identifiers.   I discussed the limitations, risks, security and privacy concerns of performing an evaluation and management service virtually and the availability of in person appointments. I also discussed with the patient that there may be a patient responsible charge related to this service. The patient expressed understanding and agreed to proceed. Subjective:  Dinara Lupu is a 30 y.o. G3P1011 at [redacted]w[redacted]d being seen today for ongoing prenatal care.  She is currently monitored for the following issues for this high-risk pregnancy and has Former smoker; Asthma; Rh negative status during pregnancy; Postoperative wound cellulitis; Supervision of high risk pregnancy, antepartum; Obesity affecting pregnancy; Abnormal Pap smear of cervix; History of gestational diabetes in prior pregnancy, currently pregnant; and Positive urine drug screen on their problem list.  Patient reports no complaints.  Contractions: Not present. Vag. Bleeding: None.  Movement: Present. Denies any leaking of fluid.   The following portions of the patient's history were reviewed and updated as appropriate: allergies, current medications, past family history, past medical history, past social history, past surgical history and problem list.   Objective:  There were no vitals filed for this visit.  Fetal Status:     Movement: Present     General:  Alert, oriented and cooperative. Patient is in no acute distress.  Respiratory: Normal respiratory effort, no problems with respiration noted  Mental Status: Normal mood and affect. Normal behavior. Normal judgment and thought content.  Rest of physical exam deferred due to type of  encounter  Imaging: [place] MFM OB FOLLOW UP  Result Date: 01/20/2020 ----------------------------------------------------------------------  OBSTETRICS REPORT                       (Signed Final 01/20/2020 04:24 pm) ---------------------------------------------------------------------- Patient Info  ID #:       [id number]                          D.O.B.:  05-18-1990 (30 yrs)  Name:       [name]               Visit Date: 01/20/2020 03:58 pm ---------------------------------------------------------------------- Performed By  Attending:        Ma Rings MD         Referred By:      Duane Lope  Performed By:     Emeline Darling BS,      [facility] ---------------------------------------------------------------------- Orders  #  Description                           Code        Ordered By  1  [place] MFM OB FOLLOW UP                   [id number]    Lin Landsman ----------------------------------------------------------------------  #  Order #                     Accession #                Episode #  1  [id number]                   [id number]                 [id number] ---------------------------------------------------------------------- Indications  Antenatal follow-up for nonvisualized fetal    Z36.2  anatomy  Obesity complicating pregnancy, second         O99.212  trimester  [redacted] weeks gestation of pregnancy                Z3A.23 ---------------------------------------------------------------------- Fetal Evaluation  Num Of Fetuses:         1  Fetal Heart Rate(bpm):  153  Cardiac Activity:       Observed  Presentation:           Cephalic  Placenta:                Anterior  P. Cord Insertion:      Marginal insertion prev  Amniotic Fluid  AFI FV:      Within normal limits                              Largest Pocket(cm)                              6.8 ---------------------------------------------------------------------- Biometry  BPD:      58.9  mm     G. Age:  24w 1d         52  %    CI:         77.5   %    70 - 86                                                          FL/HC:      21.2   %  18.7 - 20.9  HC:      211.8  mm     G. Age:  23w 2d         14  %    HC/AC:      1.01        1.05 - 1.21  AC:      209.1  mm     G. Age:  25w 3d         87  %    FL/BPD:     76.2   %    71 - 87  FL:       44.9  mm     G. Age:  24w 6d         69  %    FL/AC:      21.5   %    20 - 24  Est. FW:     751  gm    1 lb 10 oz      88  % ---------------------------------------------------------------------- OB History  Gravidity:    3         Term:   1         SAB:   1  Living:       1 ---------------------------------------------------------------------- Gestational Age  Clinical EDD:  23w 6d                                        EDD:   05/12/20  U/S Today:     24w 3d                                        EDD:   05/08/20  Best:          23w 6d     Det. By:  Clinical EDD             EDD:   05/12/20 ---------------------------------------------------------------------- Anatomy  Cranium:               Appears normal         LVOT:                   Appears normal  Cavum:                 Appears normal         Aortic Arch:            Appears normal  Ventricles:            Appears normal         Ductal Arch:            Appears normal  Choroid Plexus:        Previously seen        Diaphragm:              Appears normal  Cerebellum:            Previously seen        Stomach:                Appears normal, left  sided  Posterior Fossa:       Previously seen        Abdomen:                Appears normal  Nuchal Fold:            Previously seen        Abdominal Wall:         Previously seen  Face:                  Orbits and profile     Cord Vessels:           Previously seen                         previously seen  Lips:                  Previously seen        Kidneys:                Appear normal  Palate:                Previously seen        Bladder:                Appears normal  Thoracic:              Appears normal         Spine:                  Appears normal  Heart:                 Appears normal         Upper Extremities:      Previously seen                         (4CH, axis, and                         situs)  RVOT:                  Appears normal         Lower Extremities:      Previously seen  Other:  3VV visualized. Technically difficult due to fetal position. ---------------------------------------------------------------------- Cervix Uterus Adnexa  Cervix  Length:           3.37  cm.  Normal appearance by transabdominal scan. ---------------------------------------------------------------------- Comments  This patient was seen for a follow up exam as the views of  the fetal anatomy were unable to be fully visualized during  her last exam.  She denies any problems since her last exam.  She was informed that the fetal growth and amniotic fluid  level appears appropriate for her gestational age.  The views of the fetal anatomy were visualized today.  There  were no obvious anomalies noted.  The limitations of ultrasound in the detection of all anomalies  was discussed.  Due to maternal obesity, a follow-up exam was scheduled in  8 weeks. ----------------------------------------------------------------------                   Ma RingsVictor Fang, MD Electronically Signed Final Report   01/20/2020 04:24 pm ----------------------------------------------------------------------   Assessment and Plan:  Pregnancy: G3P1011 at 6062w5d 1. Supervision of high risk pregnancy, antepartum Patient is doing well without complaints Work letter  provided with physical restrictions in pregnancy provided Third trimester labs with glucola provided  2. Obesity affecting pregnancy in second trimester Rx ASA provided  3. Rh negative status during pregnancy in second trimester Rhogam at next visit  Preterm labor symptoms and general obstetric precautions including but not limited to vaginal bleeding, contractions, leaking of fluid and fetal movement were reviewed in detail with the patient. I discussed the assessment and treatment plan with the patient. The patient was provided an opportunity to ask questions and all were answered. The patient agreed with the plan and demonstrated an understanding of the instructions. The patient was advised to call back or seek an in-person office evaluation/[facility] for any urgent or concerning symptoms. Please refer to After Visit Summary for other counseling recommendations.   I provided 15 minutes of face-to-face time during this encounter.  Return in about 4 weeks (around 03/02/2020) for in person, ROB, High risk, 2 hr glucola next visit.  Future Appointments  Date Time Provider Department Center  02/03/2020 11:00 AM Unnamed Hino, Gigi GinPeggy, MD [facility] None    Catalina AntiguaPeggy Gissele Narducci, MD [facility], [facility]

## 2020-02-03 NOTE — Progress Notes (Signed)
Pt would like to discus she work conditions.  Pt cannot take BP for today's intake.

## 2020-02-22 ENCOUNTER — Inpatient Hospital Stay (HOSPITAL_COMMUNITY)
Admission: AD | Admit: 2020-02-22 | Discharge: 2020-02-22 | Disposition: A | Discharge status: Home / Self Care | Payer: BC Managed Care – PPO | Attending: Family Medicine | Admitting: Family Medicine

## 2020-02-22 ENCOUNTER — Other Ambulatory Visit: Payer: Self-pay

## 2020-02-22 DIAGNOSIS — O26893 Other specified pregnancy related conditions, third trimester: Secondary | ICD-10-CM | POA: Diagnosis present

## 2020-02-22 DIAGNOSIS — Z79899 Other long term (current) drug therapy: Secondary | ICD-10-CM | POA: Insufficient documentation

## 2020-02-22 DIAGNOSIS — O99891 Other specified diseases and conditions complicating pregnancy: Secondary | ICD-10-CM | POA: Diagnosis not present

## 2020-02-22 DIAGNOSIS — Z3A28 28 weeks gestation of pregnancy: Secondary | ICD-10-CM | POA: Diagnosis not present

## 2020-02-22 DIAGNOSIS — M549 Dorsalgia, unspecified: Secondary | ICD-10-CM

## 2020-02-22 DIAGNOSIS — Z87891 Personal history of nicotine dependence: Secondary | ICD-10-CM | POA: Diagnosis not present

## 2020-02-22 MED ORDER — ACETAMINOPHEN 325 MG PO TABS
650.0000 mg | ORAL_TABLET | Freq: Once | ORAL | Status: AC
  Administered 2020-02-22: 650 mg via ORAL
  Filled 2020-02-22: qty 2

## 2020-02-22 MED ORDER — CYCLOBENZAPRINE HCL 5 MG PO TABS
10.0000 mg | ORAL_TABLET | Freq: Once | ORAL | Status: AC
  Administered 2020-02-22: 10 mg via ORAL
  Filled 2020-02-22: qty 2

## 2020-02-22 MED ORDER — CYCLOBENZAPRINE HCL 10 MG PO TABS: 10.0000 mg | ORAL_TABLET | Freq: Two times a day (BID) | ORAL | 0 refills | Status: DC | PRN

## 2020-02-22 NOTE — MAU Provider Note (Signed)
History     CSN: [id number]  Arrival date and time: 02/22/20 1010   Event Date/Time   First Provider Initiated Contact with Patient 02/22/20 1045      Chief Complaint  Patient presents with  . Muscle Pain   Mary Davies is a 30 y.o. G3P1001 at [redacted]w[redacted]d who receives care at [facility].  She presents today for Muscle Pain.  She reports "a knot on my backbone."  She states it is getting bigger and has been present for ~2 months.  She states it causes pressure when standing or ambulating.  She states the pain is always present and causes a "shooting pain" when walking or moving.  She reports taking icy/hot, but has not received any relief.  She states the pain is so severe, at times, that it causes her to become tearful.  She rates the pain a 8-9/10.  She endorses fetal movement and denies abdominal cramping or contractions.  Patient also denies vaginal concerns including abnormal discharge, leaking, or bleeding.    OB History    Gravida  3   Para  1   Term  1   Preterm  0   AB  1   Living  1     SAB  1   IAB  0   Ectopic  0   Multiple  0   Live Births  1        Obstetric Comments  C/s for FTP, got to 5cm         Past Medical History:  Diagnosis Date  . Asthma    prn inhaler  . Closed left radial fracture 01/15/2011  . Depression    hx of, fine now  . Fracture of metatarsal bone of left foot 04/22/2015   5th metatarsal  . Left breast abscess 04/2015    Past Surgical History:  Procedure Laterality Date  . CESAREAN SECTION N/A 05/08/2017   Procedure: CESAREAN SECTION;  Surgeon: Tilda Burrow, MD;  Location: [facility]IR[facility]ING [facility];  Service: Obstetrics;  Laterality: N/A;  . INCISION AND DRAINAGE ABSCESS Left 09/24/2012   Procedure: INCISION AND DRAINAGE BREAST ABSCESS;  Surgeon: Robyne Askew, MD;  Location: [facility] OR;  Service: General;  Laterality: Left;  . INCISION AND DRAINAGE ABSCESS Left 05/08/2015   Procedure: INCISION AND DRAINAGE LEFT BREAST  ABSCESS;   Surgeon: Chevis Pretty III, MD;  Location: [facility];  Service: General;  Laterality: Left;    Family History  Problem Relation Age of Onset  . Diabetes Mother   . Stroke Mother   . Asthma Father     Social History   Tobacco Use  . Smoking status: Former Smoker    Packs/day: 0.25    Years: 10.00    Pack years: 2.50    Types: Cigarettes    Quit date: 12/14/2019    Years since quitting: 0.1  . Smokeless tobacco: Never Used  . Tobacco comment: trying to quit  Vaping Use  . Vaping Use: Never used  Substance Use Topics  . Alcohol use: No  . Drug use: No    Allergies:  Allergies  Allergen Reactions  . Pineapple Shortness Of Breath, Itching and Swelling    Medications Prior to Admission  Medication Sig Dispense Refill Last Dose  . acetaminophen (TYLENOL) 325 MG tablet Take 650 mg by mouth every 6 (six) hours as needed.     . Prenatal MV & Min w/FA-DHA (PRENATAL GUMMIES) 0.18-25 MG CHEW Chew by mouth.  Review of Systems  Constitutional: Negative for chills and fever.  Respiratory: Negative for cough and shortness of breath.   Gastrointestinal: Negative for abdominal pain, constipation, diarrhea, nausea and vomiting.  Genitourinary: Negative for difficulty urinating, dysuria and pelvic pain.  Musculoskeletal: Positive for back pain.  Neurological: Negative for dizziness, light-headedness and headaches.   Physical Exam   Blood pressure 116/74, pulse 97, temperature 98.5 F (36.9 C), resp. rate 19, weight 100 kg, last menstrual period 07/15/2019, unknown if currently breastfeeding.  Physical Exam Vitals reviewed.  Constitutional:      Appearance: Normal appearance. She is obese.  HENT:     Head: Normocephalic and atraumatic.  Eyes:     Conjunctiva/sclera: Conjunctivae normal.  Cardiovascular:     Rate and Rhythm: Normal rate and regular rhythm.     Heart sounds: Normal heart sounds.  Pulmonary:     Effort: Pulmonary effort is normal.   Abdominal:     General: Bowel sounds are normal.     Comments: Gravid  Musculoskeletal:        General: Normal range of motion.     Cervical back: Normal range of motion.     Lumbar back: Tenderness present.       Back:  Skin:    General: Skin is warm and dry.  Neurological:     Mental Status: She is alert and oriented to person, place, and time.  Psychiatric:        Mood and Affect: Mood normal.        Behavior: Behavior normal.        Thought Content: Thought content normal.     MAU Course  Procedures No results found for this or any previous visit (from the past 24 hour(s)).  MDM Exam Muscle Relaxant  Assessment and Plan  30 year old, G3P1011  SIUP at 28.3weeks Back Pain  -Reviewed POC with patient. -Exam performed and findings discussed.  -Discussed usage of muscle relaxant and heating pad.  -Patient agreeable. -Will give flexeril, tylenol, and heat now. -Discussed rest and will take OOW until Sunday Feb 13. -Patient agreeable and without questions or concerns. -Will monitor and reassess.  Cherre Robins 02/22/2020, 10:45 AM   Reassessment (11:34 AM) -Patient reports improvement in pain with heat and medication. -Will send script for flexeril.  -Encouraged rest. -Instructed to keep appt as scheduled.  -Encouraged to call or return to MAU if symptoms worsen or with the onset of new symptoms. -Discharged to home in improved condition.  Cherre Robins MSN, CNM Advanced Practice Provider, [facility] for [employer]

## 2020-02-22 NOTE — MAU Note (Signed)
Patient has a knot in her muscle in her right lower back for the past 2 months.  Reports when she applies pressure that the pain radiates down her leg. 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  Tried icy hot w/o relief.  Denies cramping/VB/LOF.  Endorses + FM.

## 2020-02-22 NOTE — Discharge Instructions (Signed)
Back Injury Prevention Back injuries can be very painful. They can also be difficult to heal. After having one back injury, you are more likely to have another one again. It is important to learn how to avoid injuring or re-injuring your back. The following tips can help you to prevent a back injury. What actions can I take to prevent back injuries? Nutrition changes Talk with your health care provider about your overall diet, and especially about foods that strengthen your bones.  Ask your health care provider how much calcium and vitamin D you need each day. These nutrients help to prevent weakening of the bones (osteoporosis). Osteoporosis can cause broken (fractured) bones, which lead to back pain.  Eat foods that are good sources of calcium. These include dairy products, green leafy vegetables, and products that have had calcium added to them (fortified).  Eat foods that are good sources of vitamin D. These include milk and foods that are fortified with vitamin D.  If needed, take supplements and vitamins as directed by your health care provider. Physical fitness Physical fitness strengthens your bones and your muscles. It also increases your balance and strength.  Exercise for 30 minutes per day on most days of the week, or as directed by your health care provider. Make sure to: ? Do aerobic exercises, such as walking, jogging, biking, or swimming. ? Do exercises that increase balance and strength, such as tai chi and yoga. These can decrease your risk of falling and injuring your back. ? Do stretching exercises to help with flexibility. ? Develop strong abdominal muscles. Your abdominal muscles provide a lot of the support that your back needs.  Maintain a healthy weight. This helps to decrease your risk of a back injury. Good posture Prevent back injuries by developing and maintaining a good posture. To do this successfully:  Sit up and stand up straight. Avoid leaning forward when  you sit or hunching over when you stand.  Choose chairs that have good low-back (lumbar) support.  If you work at a desk, sit close to it so you do not need to lean over. Keep your chin tucked in. Keep your neck drawn back, and keep your elbows bent at a right angle.  Sit high and close to the steering wheel when you drive. Add a lumbar support to your car seat, if needed.  Avoid sitting or standing in one position for very long. Take breaks to get up, stretch, and walk around at least one time every hour. Take breaks every hour if you are driving for long periods of time.  Sleep on your side with your knees slightly bent, or sleep on your back with a pillow under your knees.         Lifting, twisting, and reaching Back injuries are more likely to occur when carrying loads and twisting at the same time. When you bend and lift, or reach for items that are high up in shelves, use positions that put less stress on your back.  Heavy lifting ? Avoid heavy lifting, especially the kind of heavy lifting that is repetitive. If you must do heavy lifting:  Stretch before lifting.  Work slowly.  Rest between lifts.  Use a tool such as a cart or a dolly to move objects.  Make several small trips instead of carrying one heavy load.  Ask for help when you need it, especially when moving big or heavy objects. ? Follow these steps when lifting:  Stand with your feet  shoulder-width apart.  Get as close to the object as you can. Do not try to pick up a heavy object that is far from your body.  Use handles or lifting straps if they are available.  Bend at your knees. Squat down, but keep your heels off the floor.  Keep your shoulders pulled back, your chin tucked in, and your back straight.  Lift the object slowly while you tighten the muscles in your legs, abdomen, and buttocks. Keep the object as close to the center of your body as possible. ? Follow these steps when putting down a heavy  load:  Stand with your feet shoulder-width apart.  Lower the object slowly while you tighten the muscles in your legs, abdomen, and buttocks. Keep the object as close to the center of your body as possible.  Keep your shoulders pulled back, your chin tucked in, and your back straight.  Bend at your knees. Squat down, but keep your heels off the floor.  Use handles or lifting straps if they are available.  Twisting and reaching ? Avoid lifting heavy objects above your waist. ? Do not twist at your waist while you are lifting or carrying a load. If you need to turn, move your feet. ? Do not bend over without bending at your knees. ? Avoid reaching over your head, across a table, or for an object on a high surface.   Other changes  Avoid wet floors and icy ground. Keep sidewalks clear of ice to prevent falls.  Do not sleep on a mattress that is too soft or too hard.  Put heavier objects on shelves at waist level, and put lighter objects on lower or higher shelves.  Find ways to decrease your stress, such as by exercising, getting a massage, or practicing relaxation techniques. Stress can build up in your muscles. Tense muscles are more vulnerable to injury.  Talk with your health care provider if you feel anxious or depressed. These conditions can make back pain worse.  Wear flat heel shoes with cushioned soles.  Use both shoulder straps when carrying a backpack.  Do not use any products that contain nicotine or tobacco, such as cigarettes and e-cigarettes. If you need help quitting, ask your health care provider.   Summary  Back injuries can be very painful and difficult to heal.  You can prevent injuring or re-injuring your back by making nutrition changes, working on being physically fit, developing a good posture, and lifting heavy objects in a safe way.  Making other changes can also help to prevent back injuries. These include eating a healthy diet, exercising regularly and  maintaining a healthy weight. This information is not intended to replace advice given to you by your health care provider. Make sure you discuss any questions you have with your health care provider. Document Revised: 09/23/2018 Document Reviewed: 02/15/2017 Elsevier Patient Education  2021 [place]. Back Pain in Pregnancy Back pain during pregnancy is common. Back pain may be caused by several factors that are related to changes during your pregnancy. Follow these instructions at home: Managing pain, stiffness, and swelling  If directed, for sudden (acute) back pain, put ice on the painful area. ? Put ice in a plastic bag. ? Place a towel between your skin and the bag. ? Leave the ice on for 20 minutes, 2-3 times per day.  If directed, apply heat to the affected area before you exercise. Use the heat source that your health care provider recommends, such  as a moist heat pack or a heating pad. ? Place a towel between your skin and the heat source. ? Leave the heat on for 20-30 minutes. ? Remove the heat if your skin turns bright red. This is especially important if you are unable to feel pain, heat, or cold. You may have a greater risk of getting burned.  If directed, massage the affected area.      Activity  Exercise as told by your health care provider. Gentle exercise is the best way to prevent or manage back pain.  Listen to your body when lifting. If lifting hurts, ask for help or bend your knees. This uses your leg muscles instead of your back muscles.  Squat down when picking up something from the floor. Do not bend over.  Only use bed rest for short periods as told by your health care provider. Bed rest should only be used for the most severe episodes of back pain. Standing, sitting, and lying down  Do not stand in one place for long periods of time.  Use good posture when sitting. Make sure your head rests over your shoulders and is not hanging forward. Use a pillow  on your lower back if necessary.  Try sleeping on your side, preferably the left side, with a pregnancy support pillow or 1-2 regular pillows between your legs. ? If you have back pain after a night's rest, your bed may be too soft. ? A firm mattress may provide more support for your back during pregnancy. General instructions  Do not wear high heels.  Eat a healthy diet. Try to gain weight within your health care provider's recommendations.  Use a maternity girdle, elastic sling, or back brace as told by your health care provider.  Take over-the-counter and prescription medicines only as told by your health care provider.  Work with a physical therapist or massage therapist to find ways to manage back pain. Acupuncture or massage therapy may be helpful.  Keep all follow-up visits as told by your health care provider. This is important. Contact a health care provider if:  Your back pain interferes with your daily activities.  You have increasing pain in other parts of your body. Get help right away if:  You develop numbness, tingling, weakness, or problems with the use of your arms or legs.  You develop severe back pain that is not controlled with medicine.  You have a change in bowel or bladder control.  You develop shortness of breath, dizziness, or you faint.  You develop nausea, vomiting, or sweating.  You have back pain that is a rhythmic, cramping pain similar to labor pains. Labor pain is usually 1-2 minutes apart, lasts for about 1 minute, and involves a bearing down feeling or pressure in your pelvis.  You have back pain and your water breaks or you have vaginal bleeding.  You have back pain or numbness that travels down your leg.  Your back pain developed after you fell.  You develop pain on one side of your back.  You see blood in your urine.  You develop skin blisters in the area of your back pain. Summary  Back pain may be caused by several factors that  are related to changes during your pregnancy.  Follow instructions as told by your health care provider for managing pain, stiffness, and swelling.  Exercise as told by your health care provider. Gentle exercise is the best way to prevent or manage back pain.  Take over-the-counter  and prescription medicines only as told by your health care provider.  Keep all follow-up visits as told by your health care provider. This is important. This information is not intended to replace advice given to you by your health care provider. Make sure you discuss any questions you have with your health care provider. Document Revised: 04/21/2018 Document Reviewed: 06/18/2017 Elsevier Patient Education  [place].

## 2020-03-03 ENCOUNTER — Other Ambulatory Visit: Payer: BC Managed Care – PPO

## 2020-03-03 ENCOUNTER — Ambulatory Visit (INDEPENDENT_AMBULATORY_CARE_PROVIDER_SITE_OTHER): Payer: BC Managed Care – PPO | Admitting: Obstetrics and Gynecology

## 2020-03-03 ENCOUNTER — Other Ambulatory Visit: Payer: Self-pay

## 2020-03-03 VITALS — BP 126/74 | HR 94 | Wt 229.0 lb

## 2020-03-03 DIAGNOSIS — J4520 Mild intermittent asthma, uncomplicated: Secondary | ICD-10-CM

## 2020-03-03 DIAGNOSIS — Z3A29 29 weeks gestation of pregnancy: Secondary | ICD-10-CM | POA: Insufficient documentation

## 2020-03-03 DIAGNOSIS — O09299 Supervision of pregnancy with other poor reproductive or obstetric history, unspecified trimester: Secondary | ICD-10-CM

## 2020-03-03 DIAGNOSIS — Z8632 Personal history of gestational diabetes: Secondary | ICD-10-CM

## 2020-03-03 DIAGNOSIS — O26893 Other specified pregnancy related conditions, third trimester: Secondary | ICD-10-CM | POA: Diagnosis not present

## 2020-03-03 DIAGNOSIS — O0990 Supervision of high risk pregnancy, unspecified, unspecified trimester: Secondary | ICD-10-CM | POA: Diagnosis not present

## 2020-03-03 DIAGNOSIS — Z6791 Unspecified blood type, Rh negative: Secondary | ICD-10-CM | POA: Diagnosis not present

## 2020-03-03 DIAGNOSIS — O99213 Obesity complicating pregnancy, third trimester: Secondary | ICD-10-CM

## 2020-03-03 MED ORDER — RHO D IMMUNE GLOBULIN 1500 UNIT/2ML IJ SOSY
300.0000 ug | PREFILLED_SYRINGE | Freq: Once | INTRAMUSCULAR | Status: AC
  Administered 2020-03-03: 300 ug via INTRAMUSCULAR

## 2020-03-03 NOTE — Addendum Note (Signed)
Addended by: Hamilton Capri on: 03/03/2020 10:40 AM   Modules accepted: Orders

## 2020-03-03 NOTE — Patient Instructions (Signed)
Back Pain in Pregnancy Back pain during pregnancy is common. Back pain may be caused by several factors that are related to changes during your pregnancy. Follow these instructions at home: Managing pain, stiffness, and swelling  If directed, for sudden (acute) back pain, put ice on the painful area. ? Put ice in a plastic bag. ? Place a towel between your skin and the bag. ? Leave the ice on for 20 minutes, 2-3 times per day.  If directed, apply heat to the affected area before you exercise. Use the heat source that your health care provider recommends, such as a moist heat pack or a heating pad. ? Place a towel between your skin and the heat source. ? Leave the heat on for 20-30 minutes. ? Remove the heat if your skin turns bright red. This is especially important if you are unable to feel pain, heat, or cold. You may have a greater risk of getting burned.  If directed, massage the affected area.      Activity  Exercise as told by your health care provider. Gentle exercise is the best way to prevent or manage back pain.  Listen to your body when lifting. If lifting hurts, ask for help or bend your knees. This uses your leg muscles instead of your back muscles.  Squat down when picking up something from the floor. Do not bend over.  Only use bed rest for short periods as told by your health care provider. Bed rest should only be used for the most severe episodes of back pain. Standing, sitting, and lying down  Do not stand in one place for long periods of time.  Use good posture when sitting. Make sure your head rests over your shoulders and is not hanging forward. Use a pillow on your lower back if necessary.  Try sleeping on your side, preferably the left side, with a pregnancy support pillow or 1-2 regular pillows between your legs. ? If you have back pain after a night's rest, your bed may be too soft. ? A firm mattress may provide more support for your back during  pregnancy. General instructions  Do not wear high heels.  Eat a healthy diet. Try to gain weight within your health care provider's recommendations.  Use a maternity girdle, elastic sling, or back brace as told by your health care provider.  Take over-the-counter and prescription medicines only as told by your health care provider.  Work with a physical therapist or massage therapist to find ways to manage back pain. Acupuncture or massage therapy may be helpful.  Keep all follow-up visits as told by your health care provider. This is important. Contact a health care provider if:  Your back pain interferes with your daily activities.  You have increasing pain in other parts of your body. Get help right away if:  You develop numbness, tingling, weakness, or problems with the use of your arms or legs.  You develop severe back pain that is not controlled with medicine.  You have a change in bowel or bladder control.  You develop shortness of breath, dizziness, or you faint.  You develop nausea, vomiting, or sweating.  You have back pain that is a rhythmic, cramping pain similar to labor pains. Labor pain is usually 1-2 minutes apart, lasts for about 1 minute, and involves a bearing down feeling or pressure in your pelvis.  You have back pain and your water breaks or you have vaginal bleeding.  You have back pain or   numbness that travels down your leg.  Your back pain developed after you fell.  You develop pain on one side of your back.  You see blood in your urine.  You develop skin blisters in the area of your back pain. Summary  Back pain may be caused by several factors that are related to changes during your pregnancy.  Follow instructions as told by your health care provider for managing pain, stiffness, and swelling.  Exercise as told by your health care provider. Gentle exercise is the best way to prevent or manage back pain.  Take over-the-counter and  prescription medicines only as told by your health care provider.  Keep all follow-up visits as told by your health care provider. This is important. This information is not intended to replace advice given to you by your health care provider. Make sure you discuss any questions you have with your health care provider. Document Revised: 04/21/2018 Document Reviewed: 06/18/2017 Elsevier Patient Education  2021 [employer]  

## 2020-03-03 NOTE — Progress Notes (Signed)
ROB [redacted]w[redacted]d  CC: Back Pain    TDAP : Declined

## 2020-03-03 NOTE — Progress Notes (Signed)
   PRENATAL VISIT NOTE  Subjective:  Mary Davies is a 30 y.o. G3P1011 at [redacted]w[redacted]d being seen today for ongoing prenatal care.  She is currently monitored for the following issues for this high-risk pregnancy and has Former smoker; Asthma; Rh negative status during pregnancy; Postoperative wound cellulitis; Supervision of high risk pregnancy, antepartum; Obesity affecting pregnancy; Abnormal Pap smear of cervix; History of gestational diabetes in prior pregnancy, currently pregnant; Positive urine drug screen; and [redacted] weeks gestation of pregnancy on their problem list.  Patient doing well with no acute concerns today. She reports no complaints.  Contractions: Not present. Vag. Bleeding: None.  Movement: Present. Denies leaking of fluid.   Discussed with patient delivery route.  Pt unsure about repeat c/s versus VBAC.  Discussed risk and benefits of both.  Form not signed today as patient wishes to discuss with spouse.    The following portions of the patient's history were reviewed and updated as appropriate: allergies, current medications, past family history, past medical history, past social history, past surgical history and problem list. Problem list updated.  Objective:   Vitals:   03/03/20 0850  BP: 126/74  Pulse: 94  Weight: 229 lb (103.9 kg)    Fetal Status: Fetal Heart Rate (bpm): 140 Fundal Height: 29 cm Movement: Present     General:  Alert, oriented and cooperative. Patient is in no acute distress.  Skin: Skin is warm and dry. No rash noted.   Cardiovascular: Normal heart rate noted  Respiratory: Normal respiratory effort, no problems with respiration noted  Abdomen: Soft, gravid, appropriate for gestational age.  Pain/Pressure: Present     Pelvic: Cervical exam deferred        Extremities: Normal range of motion.  Edema: None  Mental Status:  Normal mood and affect. Normal behavior. Normal judgment and thought content.   Assessment and Plan:  Pregnancy: G3P1011 at  [redacted]w[redacted]d  1. Supervision of high risk pregnancy, antepartum Discuss VBAC vs repeat c/s next visit and sign form - Glucose Tolerance, 2 Hours w/1 Hour - CBC - RPR - HIV Antibody (routine testing w rflx)  2. Intermittent asthma without complication, unspecified asthma severity   3. Rh negative status during pregnancy in third trimester Rhogam today  4. Obesity affecting pregnancy in third trimester Pt has repeat growth on 03/16/2020  5. History of gestational diabetes in prior pregnancy, currently pregnant 2 hour GTT today  6. [redacted] weeks gestation of pregnancy   Preterm labor symptoms and general obstetric precautions including but not limited to vaginal bleeding, contractions, leaking of fluid and fetal movement were reviewed in detail with the patient.  Please refer to After Visit Summary for other counseling recommendations.   Return in about 2 weeks (around 03/17/2020) for [facility], in person.   Mariel Aloe, MD Faculty Attending Center for [facility]

## 2020-03-04 LAB — CBC
Hematocrit: 36.3 % (ref 34.0–46.6)
Hemoglobin: 11.9 g/dL (ref 11.1–15.9)
MCH: 32 pg (ref 26.6–33.0)
MCHC: 32.8 g/dL (ref 31.5–35.7)
MCV: 98 fL — ABNORMAL HIGH (ref 79–97)
Platelets: 250 10*3/uL (ref 150–450)
RBC: 3.72 x10E6/uL — ABNORMAL LOW (ref 3.77–5.28)
RDW: 13.1 % (ref 11.7–15.4)
WBC: 9.4 10*3/uL (ref 3.4–10.8)

## 2020-03-04 LAB — RPR: RPR Ser Ql: NONREACTIVE

## 2020-03-04 LAB — GLUCOSE TOLERANCE, 2 HOURS W/ 1HR
Glucose, 1 hour: 156 mg/dL (ref 65–179)
Glucose, 2 hour: 104 mg/dL (ref 65–152)
Glucose, Fasting: 80 mg/dL (ref 65–91)

## 2020-03-04 LAB — HIV ANTIBODY (ROUTINE TESTING W REFLEX): HIV Screen 4th Generation wRfx: NONREACTIVE

## 2020-03-15 ENCOUNTER — Other Ambulatory Visit: Payer: Self-pay | Admitting: *Deleted

## 2020-03-15 DIAGNOSIS — Z6841 Body Mass Index (BMI) 40.0 and over, adult: Secondary | ICD-10-CM

## 2020-03-16 ENCOUNTER — Encounter: Payer: Self-pay | Admitting: *Deleted

## 2020-03-16 ENCOUNTER — Ambulatory Visit: Payer: BC Managed Care – PPO | Admitting: *Deleted

## 2020-03-16 ENCOUNTER — Ambulatory Visit: Payer: BC Managed Care – PPO | Attending: Obstetrics

## 2020-03-16 ENCOUNTER — Other Ambulatory Visit: Payer: Self-pay

## 2020-03-16 DIAGNOSIS — O0990 Supervision of high risk pregnancy, unspecified, unspecified trimester: Secondary | ICD-10-CM | POA: Diagnosis not present

## 2020-03-16 DIAGNOSIS — R825 Elevated urine levels of drugs, medicaments and biological substances: Secondary | ICD-10-CM | POA: Insufficient documentation

## 2020-03-16 DIAGNOSIS — O99213 Obesity complicating pregnancy, third trimester: Secondary | ICD-10-CM

## 2020-03-16 DIAGNOSIS — O43193 Other malformation of placenta, third trimester: Secondary | ICD-10-CM | POA: Diagnosis not present

## 2020-03-16 DIAGNOSIS — Z8632 Personal history of gestational diabetes: Secondary | ICD-10-CM

## 2020-03-16 DIAGNOSIS — O09299 Supervision of pregnancy with other poor reproductive or obstetric history, unspecified trimester: Secondary | ICD-10-CM

## 2020-03-16 DIAGNOSIS — Z3A31 31 weeks gestation of pregnancy: Secondary | ICD-10-CM

## 2020-03-16 DIAGNOSIS — Z6841 Body Mass Index (BMI) 40.0 and over, adult: Secondary | ICD-10-CM | POA: Diagnosis not present

## 2020-03-17 ENCOUNTER — Encounter: Payer: BC Managed Care – PPO | Admitting: Obstetrics and Gynecology

## 2020-03-27 ENCOUNTER — Encounter: Payer: Self-pay | Admitting: Obstetrics and Gynecology

## 2020-03-27 ENCOUNTER — Ambulatory Visit (INDEPENDENT_AMBULATORY_CARE_PROVIDER_SITE_OTHER): Payer: BC Managed Care – PPO | Admitting: Obstetrics and Gynecology

## 2020-03-27 ENCOUNTER — Other Ambulatory Visit: Payer: Self-pay

## 2020-03-27 VITALS — BP 129/74 | HR 99 | Wt 234.0 lb

## 2020-03-27 DIAGNOSIS — O26893 Other specified pregnancy related conditions, third trimester: Secondary | ICD-10-CM

## 2020-03-27 DIAGNOSIS — O0990 Supervision of high risk pregnancy, unspecified, unspecified trimester: Secondary | ICD-10-CM

## 2020-03-27 DIAGNOSIS — Z6791 Unspecified blood type, Rh negative: Secondary | ICD-10-CM

## 2020-03-27 DIAGNOSIS — O34219 Maternal care for unspecified type scar from previous cesarean delivery: Secondary | ICD-10-CM

## 2020-03-27 DIAGNOSIS — O99213 Obesity complicating pregnancy, third trimester: Secondary | ICD-10-CM

## 2020-03-27 NOTE — Progress Notes (Signed)
   PRENATAL VISIT NOTE  Subjective:  Mary Davies is a 30 y.o. G3P1011 at [redacted]w[redacted]d being seen today for ongoing prenatal care.  She is currently monitored for the following issues for this high-risk pregnancy and has Former smoker; Asthma; Rh negative status during pregnancy; Previous cesarean delivery affecting pregnancy; Postoperative wound cellulitis; Supervision of high risk pregnancy, antepartum; Obesity affecting pregnancy; Abnormal Pap smear of cervix; History of gestational diabetes in prior pregnancy, currently pregnant; Positive urine drug screen; and [redacted] weeks gestation of pregnancy on their problem list.  Patient reports no complaints.  Contractions: Irritability. Vag. Bleeding: None.  Movement: Present. Denies leaking of fluid.   The following portions of the patient's history were reviewed and updated as appropriate: allergies, current medications, past family history, past medical history, past social history, past surgical history and problem list.   Objective:   Vitals:   03/27/20 1439  BP: 129/74  Pulse: 99  Weight: 234 lb (106.1 kg)    Fetal Status: Fetal Heart Rate (bpm): 145 Fundal Height: 34 cm Movement: Present     General:  Alert, oriented and cooperative. Patient is in no acute distress.  Skin: Skin is warm and dry. No rash noted.   Cardiovascular: Normal heart rate noted  Respiratory: Normal respiratory effort, no problems with respiration noted  Abdomen: Soft, gravid, appropriate for gestational age.  Pain/Pressure: Absent     Pelvic: Cervical exam deferred        Extremities: Normal range of motion.  Edema: None  Mental Status: Normal mood and affect. Normal behavior. Normal judgment and thought content.   Assessment and Plan:  Pregnancy: G3P1011 at [redacted]w[redacted]d 1. Supervision of high risk pregnancy, antepartum Patient is doing well without complaints  2. Obesity affecting pregnancy in third trimester Continue ASA Normal growth 03/2020- no further follow up  needed  3. Rh negative status during pregnancy in third trimester S/p rhogam  4. Previous cesarean delivery affecting pregnancy Patient opted for TOLAC- consent signed  Preterm labor symptoms and general obstetric precautions including but not limited to vaginal bleeding, contractions, leaking of fluid and fetal movement were reviewed in detail with the patient. Please refer to After Visit Summary for other counseling recommendations.   Return in about 2 weeks (around 04/10/2020) for in person, ROB, High risk.  No future appointments.  Catalina Antigua, MD

## 2020-04-10 ENCOUNTER — Encounter: Payer: BC Managed Care – PPO | Admitting: Obstetrics and Gynecology

## 2020-04-12 ENCOUNTER — Other Ambulatory Visit: Payer: Self-pay

## 2020-04-12 ENCOUNTER — Ambulatory Visit (INDEPENDENT_AMBULATORY_CARE_PROVIDER_SITE_OTHER): Payer: BC Managed Care – PPO | Admitting: Obstetrics & Gynecology

## 2020-04-12 ENCOUNTER — Other Ambulatory Visit (HOSPITAL_COMMUNITY)
Admission: RE | Admit: 2020-04-12 | Discharge: 2020-04-12 | Disposition: A | Discharge status: Home / Self Care | Payer: BC Managed Care – PPO | Source: Ambulatory Visit | Attending: Obstetrics & Gynecology | Admitting: Obstetrics & Gynecology

## 2020-04-12 ENCOUNTER — Encounter: Payer: Self-pay | Admitting: Obstetrics & Gynecology

## 2020-04-12 VITALS — BP 108/75 | HR 93 | Wt 243.0 lb

## 2020-04-12 DIAGNOSIS — Z6791 Unspecified blood type, Rh negative: Secondary | ICD-10-CM

## 2020-04-12 DIAGNOSIS — O0990 Supervision of high risk pregnancy, unspecified, unspecified trimester: Secondary | ICD-10-CM | POA: Diagnosis not present

## 2020-04-12 DIAGNOSIS — O26893 Other specified pregnancy related conditions, third trimester: Secondary | ICD-10-CM

## 2020-04-12 DIAGNOSIS — O34219 Maternal care for unspecified type scar from previous cesarean delivery: Secondary | ICD-10-CM

## 2020-04-12 NOTE — Progress Notes (Signed)
   PRENATAL VISIT NOTE  Subjective:  Mary Davies is a 30 y.o. G3P1011 at [redacted]w[redacted]d being seen today for ongoing prenatal care.  She is currently monitored for the following issues for this high-risk pregnancy and has Former smoker; Asthma; Rh negative status during pregnancy; Previous cesarean delivery affecting pregnancy; Supervision of high risk pregnancy, antepartum; Obesity affecting pregnancy; Abnormal Pap smear of cervix; History of gestational diabetes in prior pregnancy, currently pregnant; Positive urine drug screen; and [redacted] weeks gestation of pregnancy on their problem list.  Patient reports occasional contractions.  Contractions: Irregular. Vag. Bleeding: None.  Movement: Present. Denies leaking of fluid.   The following portions of the patient's history were reviewed and updated as appropriate: allergies, current medications, past family history, past medical history, past social history, past surgical history and problem list.   Objective:   Vitals:   04/12/20 1321  BP: 108/75  Pulse: 93  Weight: 243 lb (110.2 kg)    Fetal Status: Fetal Heart Rate (bpm): 140   Movement: Present  Presentation: Vertex  General:  Alert, oriented and cooperative. Patient is in no acute distress.  Skin: Skin is warm and dry. No rash noted.   Cardiovascular: Normal heart rate noted  Respiratory: Normal respiratory effort, no problems with respiration noted  Abdomen: Soft, gravid, appropriate for gestational age.  Pain/Pressure: Present     Pelvic: Cervical exam performed in the presence of a chaperone Dilation: 1 Effacement (%): 20 Station: Ballotable  Extremities: Normal range of motion.     Mental Status: Normal mood and affect. Normal behavior. Normal judgment and thought content.   Assessment and Plan:  Pregnancy: G3P1011 at [redacted]w[redacted]d 1. Previous cesarean delivery affecting pregnancy Plans TOLAC  2. Supervision of high risk pregnancy, antepartum Nl BP  3. Rh negative status during  pregnancy in third trimester Rhogam as indicated  Preterm labor symptoms and general obstetric precautions including but not limited to vaginal bleeding, contractions, leaking of fluid and fetal movement were reviewed in detail with the patient. Please refer to After Visit Summary for other counseling recommendations.   Return in about 1 week (around 04/19/2020).  Future Appointments  Date Time Provider Department Center  04/19/2020  2:30 PM Hermina Staggers, MD [facility] None    Scheryl Darter, MD

## 2020-04-12 NOTE — Patient Instructions (Signed)

## 2020-04-13 LAB — CERVICOVAGINAL ANCILLARY ONLY
Chlamydia: NEGATIVE
Comment: NEGATIVE
Comment: NORMAL
Neisseria Gonorrhea: NEGATIVE

## 2020-04-14 LAB — STREP GP B NAA: Strep Gp B NAA: NEGATIVE

## 2020-04-19 ENCOUNTER — Encounter: Payer: BC Managed Care – PPO | Admitting: Obstetrics and Gynecology

## 2020-04-20 ENCOUNTER — Emergency Department (HOSPITAL_COMMUNITY)
Admission: EM | Admit: 2020-04-20 | Discharge: 2020-04-20 | Disposition: A | Discharge status: Home / Self Care | Payer: BC Managed Care – PPO | Attending: Emergency Medicine | Admitting: Emergency Medicine

## 2020-04-20 ENCOUNTER — Other Ambulatory Visit: Payer: Self-pay

## 2020-04-20 DIAGNOSIS — L03012 Cellulitis of left finger: Secondary | ICD-10-CM | POA: Diagnosis not present

## 2020-04-20 DIAGNOSIS — Z87891 Personal history of nicotine dependence: Secondary | ICD-10-CM | POA: Insufficient documentation

## 2020-04-20 DIAGNOSIS — J45909 Unspecified asthma, uncomplicated: Secondary | ICD-10-CM | POA: Diagnosis not present

## 2020-04-20 DIAGNOSIS — Z3A36 36 weeks gestation of pregnancy: Secondary | ICD-10-CM | POA: Insufficient documentation

## 2020-04-20 DIAGNOSIS — O26893 Other specified pregnancy related conditions, third trimester: Secondary | ICD-10-CM | POA: Insufficient documentation

## 2020-04-20 MED ORDER — CEPHALEXIN 500 MG PO CAPS: 500.0000 mg | ORAL_CAPSULE | Freq: Four times a day (QID) | ORAL | 0 refills | Status: AC

## 2020-04-20 NOTE — Discharge Instructions (Signed)
You had an abscess at the corner of your finger.  We cut this open to release the infection.  You should keep the area clean and dry.  You can wash with warm soapy water and use a bandage.  We will send you home with keflex to take four times daily for 5 days.  If this comes back, doesn't improve, worsens, or if you have fevers, please see your regular doctor right away.

## 2020-04-20 NOTE — ED Triage Notes (Signed)
Pt reports biting her nails d/t being nervous about her upcoming delivery and now has swelling, pain, and discoloration to L ring finger x 4 days.

## 2020-04-20 NOTE — ED Provider Notes (Signed)
[facility] EMERGENCY DEPARTMENT Provider Note   CSN: [id number] Arrival date & time: 04/20/20  1639     History No chief complaint on file.   Mary Davies is a 30 y.o. female.  Patient is a 30 year old  G3P1011 at 101w5d who presents with left medial ring finger pain and swelling for 4 days.  She states that she bites her nails and has had infections like this before, but has been able to improve it with using a needle to pop it open.  She states that she tried this yesterday with one of her friends, she got some pus out, but states "I did not get it all."  She states that the pain is very severe.  She denies any fevers or chills.  Reports a possible very remote history of MRSA under her arm.        Past Medical History:  Diagnosis Date  . Asthma    prn inhaler  . Closed left radial fracture 01/15/2011  . Depression    hx of, fine now  . Fracture of metatarsal bone of left foot 04/22/2015   5th metatarsal  . Left breast abscess 04/2015    Patient Active Problem List   Diagnosis Date Noted  . [redacted] weeks gestation of pregnancy 03/03/2020  . Positive urine drug screen 12/16/2019  . Obesity affecting pregnancy 11/24/2019  . Abnormal Pap smear of cervix 11/24/2019  . History of gestational diabetes in prior pregnancy, currently pregnant 11/24/2019  . Supervision of high risk pregnancy, antepartum 11/18/2019  . Previous cesarean delivery affecting pregnancy 05/10/2017  . Asthma 10/30/2016  . Rh negative status during pregnancy 10/30/2016  . Former smoker 07/14/2012    Past Surgical History:  Procedure Laterality Date  . CESAREAN SECTION N/A 05/08/2017   Procedure: CESAREAN SECTION;  Surgeon: Tilda Burrow, MD;  Location: [facility] BIRTHING SUIT[facility];  Service: Obstetrics;  Laterality: N/A;  . INCISION AND DRAINAGE ABSCESS Left 09/24/2012   Procedure: INCISION AND DRAINAGE BREAST ABSCESS;  Surgeon: Robyne Askew, MD;  Location: [facility] OR;  Service: General;   Laterality: Left;  . INCISION AND DRAINAGE ABSCESS Left 05/08/2015   Procedure: INCISION AND DRAINAGE LEFT BREAST  ABSCESS;  Surgeon: Chevis Pretty III, MD;  Location: [facility];  Service: General;  Laterality: Left;     OB History    Gravida  3   Para  1   Term  1   Preterm  0   AB  1   Living  1     SAB  1   IAB  0   Ectopic  0   Multiple  0   Live Births  1        Obstetric Comments  C/s for FTP, got to 5cm         Family History  Problem Relation Age of Onset  . Diabetes Mother   . Stroke Mother   . Asthma Father     Social History   Tobacco Use  . Smoking status: Former Smoker    Packs/day: 0.25    Years: 10.00    Pack years: 2.50    Types: Cigarettes    Quit date: 12/14/2019    Years since quitting: 0.3  . Smokeless tobacco: Never Used  . Tobacco comment: trying to quit  Vaping Use  . Vaping Use: Never used  Substance Use Topics  . Alcohol use: No  . Drug use: No    Home Medications Prior  to Admission medications   Medication Sig Start Date End Date Taking? Authorizing Provider  cephALEXin (KEFLEX) 500 MG capsule Take 1 capsule (500 mg total) by mouth 4 (four) times daily for 5 days. 04/20/20 04/25/20 Yes Joleene Burnham, Solmon Ice, DO  acetaminophen (TYLENOL) 325 MG tablet Take 650 mg by mouth every 6 (six) hours as needed. Patient not taking: Reported on 03/27/2020    [provider]  cyclobenzaprine (FLEXERIL) 10 MG tablet Take 1 tablet (10 mg total) by mouth 2 (two) times daily as needed for muscle spasms. Patient not taking: Reported on 03/27/2020 02/22/20   Gerrit Heck, CNM  Prenatal MV & Min w/FA-DHA (PRENATAL GUMMIES) 0.18-25 MG CHEW Chew by mouth.    [provider]    Allergies    Pineapple  Review of Systems   Review of Systems  Constitutional: Negative for chills and fever.  HENT: Negative for congestion.   Respiratory: Negative for shortness of breath.   Cardiovascular: Negative for chest pain.   Gastrointestinal: Negative for abdominal pain, diarrhea, nausea and vomiting.  Genitourinary: Negative for pelvic pain.  Musculoskeletal:       Left finger ringer pain and swelling  Skin: Positive for color change (redness of left ring finger near nail).    Physical Exam Updated Vital Signs BP 129/82 (BP Location: Right Arm)   Pulse 94   Temp 98.7 F (37.1 C) (Oral)   Resp 18   LMP 07/15/2019   SpO2 100%   Physical Exam Constitutional:      Appearance: Normal appearance.  Pulmonary:     Effort: Pulmonary effort is normal. No respiratory distress.  Musculoskeletal:       Hands:     Comments: Full ROM at DIP and PIP  Skin:    General: Skin is warm and dry.  Neurological:     Mental Status: She is alert and oriented to person, place, and time.     ED Results / Procedures / Treatments   Labs (all labs ordered are listed, but only abnormal results are displayed) Labs Reviewed - No data to display  EKG None  Radiology No results found.  Procedures .Marland KitchenIncision and Drainage  Date/Time: 04/20/2020 6:21 PM Performed by: Unknown Jim, DO Authorized by: Sabas Sous, MD   Consent:    Consent obtained:  Verbal   Consent given by:  Patient   Risks, benefits, and alternatives were discussed: yes     Risks discussed:  Bleeding, incomplete drainage, pain and infection Universal protocol:    Patient identity confirmed:  Verbally with patient and arm band Location:    Type:  Abscess (paronychia)   Size:  2 mm Pre-procedure details:    Skin preparation:  Povidone-iodine Sedation:    Sedation type:  None Anesthesia:    Anesthesia method:  None Procedure type:    Complexity:  Simple Procedure details:    Incision types:  Stab incision   Incision depth:  Subcutaneous   Drainage:  Purulent   Drainage amount:  Moderate   Wound treatment:  Wound left open   Packing materials:  None Post-procedure details:    Procedure completion:  Tolerated well, no  immediate complications Comments:     Placed gauze lightly over area after procedure     Medications Ordered in ED Medications - No data to display  ED Course  I have reviewed the triage vital signs and the nursing notes.  Pertinent labs & imaging results that were available during my care of the  patient were reviewed by me and considered in my medical decision making (see chart for details).    MDM Rules/Calculators/A&P                          Patient is a 30 year old female currently 36 weeks 5 days pregnant, who presents with paronychia of her left ring finger.  Patient requested incision and drainage of the area, had attempted her own I&D yesterday at home.  She tolerated the procedure above without any complications.  Moderate amount of purulent drainage presented.  We will go ahead and treat her with Keflex 500 mg 4 times daily for 5 days as well to treat other infection.  Return to care if no improvement, worsening of symptoms, develops fever.  She voiced understanding.  Vital signs remained stable and she was discharged home in stable condition.   Final Clinical Impression(s) / ED Diagnoses Final diagnoses:  Paronychia of finger of left hand    Rx / DC Orders ED Discharge Orders         Ordered    cephALEXin (KEFLEX) 500 MG capsule  4 times daily        04/20/20 1821           Eevee Borbon, Solmon Ice, DO 04/20/20 1827    Sabas Sous, MD 04/21/20 [phone]

## 2020-04-24 ENCOUNTER — Encounter: Payer: Self-pay | Admitting: Obstetrics and Gynecology

## 2020-04-24 ENCOUNTER — Ambulatory Visit (INDEPENDENT_AMBULATORY_CARE_PROVIDER_SITE_OTHER): Payer: BC Managed Care – PPO | Admitting: Obstetrics and Gynecology

## 2020-04-24 ENCOUNTER — Other Ambulatory Visit: Payer: Self-pay

## 2020-04-24 VITALS — BP 110/71 | HR 109 | Wt 245.0 lb

## 2020-04-24 DIAGNOSIS — O26893 Other specified pregnancy related conditions, third trimester: Secondary | ICD-10-CM

## 2020-04-24 DIAGNOSIS — O99213 Obesity complicating pregnancy, third trimester: Secondary | ICD-10-CM

## 2020-04-24 DIAGNOSIS — O34219 Maternal care for unspecified type scar from previous cesarean delivery: Secondary | ICD-10-CM

## 2020-04-24 DIAGNOSIS — O0990 Supervision of high risk pregnancy, unspecified, unspecified trimester: Secondary | ICD-10-CM

## 2020-04-24 DIAGNOSIS — Z6791 Unspecified blood type, Rh negative: Secondary | ICD-10-CM

## 2020-04-24 DIAGNOSIS — O0993 Supervision of high risk pregnancy, unspecified, third trimester: Secondary | ICD-10-CM

## 2020-04-24 NOTE — Progress Notes (Signed)
   PRENATAL VISIT NOTE  Subjective:  Mary Davies is a 30 y.o. G3P1011 at [redacted]w[redacted]d being seen today for ongoing prenatal care.  She is currently monitored for the following issues for this low-risk pregnancy and has Former smoker; Asthma; Rh negative status during pregnancy; Previous cesarean delivery affecting pregnancy; Supervision of high risk pregnancy, antepartum; Obesity affecting pregnancy; Abnormal Pap smear of cervix; History of gestational diabetes in prior pregnancy, currently pregnant; and Positive urine drug screen on their problem list.  Patient reports no complaints.  Contractions: Not present. Vag. Bleeding: None.  Movement: Present. Denies leaking of fluid.   The following portions of the patient's history were reviewed and updated as appropriate: allergies, current medications, past family history, past medical history, past social history, past surgical history and problem list.   Objective:   Vitals:   04/24/20 1029  BP: 110/71  Pulse: (!) 109  Weight: 245 lb (111.1 kg)    Fetal Status: Fetal Heart Rate (bpm): 147 Fundal Height: 37 cm Movement: Present     General:  Alert, oriented and cooperative. Patient is in no acute distress.  Skin: Skin is warm and dry. No rash noted.   Cardiovascular: Normal heart rate noted  Respiratory: Normal respiratory effort, no problems with respiration noted  Abdomen: Soft, gravid, appropriate for gestational age.  Pain/Pressure: Present     Pelvic: Cervical exam deferred        Extremities: Normal range of motion.  Edema: Trace  Mental Status: Normal mood and affect. Normal behavior. Normal judgment and thought content.   Assessment and Plan:  Pregnancy: G3P1011 at [redacted]w[redacted]d 1. Supervision of high risk pregnancy, antepartum Patient is doing well without complaints PLans IUD for contraception  2. Previous cesarean delivery affecting pregnancy Desires TOLAC  3. Obesity affecting pregnancy in third trimester Continue ASA  4. Rh  negative status during pregnancy in third trimester S/p rhogam  Term labor symptoms and general obstetric precautions including but not limited to vaginal bleeding, contractions, leaking of fluid and fetal movement were reviewed in detail with the patient. Please refer to After Visit Summary for other counseling recommendations.   Return for in person, ROB, Low risk.  No future appointments.  Catalina Antigua, MD

## 2020-04-24 NOTE — Progress Notes (Signed)
+   Fetal movement. No complaints.  

## 2020-05-01 ENCOUNTER — Encounter: Payer: BC Managed Care – PPO | Admitting: Nurse Practitioner

## 2020-05-10 ENCOUNTER — Other Ambulatory Visit: Payer: Self-pay

## 2020-05-10 ENCOUNTER — Ambulatory Visit (INDEPENDENT_AMBULATORY_CARE_PROVIDER_SITE_OTHER): Payer: BC Managed Care – PPO | Admitting: Obstetrics and Gynecology

## 2020-05-10 ENCOUNTER — Telehealth (HOSPITAL_COMMUNITY): Payer: Self-pay | Admitting: *Deleted

## 2020-05-10 ENCOUNTER — Encounter: Payer: Self-pay | Admitting: Obstetrics and Gynecology

## 2020-05-10 VITALS — BP 115/79 | HR 88 | Wt 253.0 lb

## 2020-05-10 DIAGNOSIS — Z8632 Personal history of gestational diabetes: Secondary | ICD-10-CM

## 2020-05-10 DIAGNOSIS — O26893 Other specified pregnancy related conditions, third trimester: Secondary | ICD-10-CM

## 2020-05-10 DIAGNOSIS — Z6791 Unspecified blood type, Rh negative: Secondary | ICD-10-CM

## 2020-05-10 DIAGNOSIS — O34219 Maternal care for unspecified type scar from previous cesarean delivery: Secondary | ICD-10-CM

## 2020-05-10 DIAGNOSIS — O09299 Supervision of pregnancy with other poor reproductive or obstetric history, unspecified trimester: Secondary | ICD-10-CM

## 2020-05-10 DIAGNOSIS — O0990 Supervision of high risk pregnancy, unspecified, unspecified trimester: Secondary | ICD-10-CM

## 2020-05-10 NOTE — Progress Notes (Signed)
+   Fetal movement. No complaints.  

## 2020-05-10 NOTE — Patient Instructions (Signed)

## 2020-05-10 NOTE — Telephone Encounter (Signed)
Preadmission screen  

## 2020-05-10 NOTE — Progress Notes (Signed)
Subjective:  Mary Davies is a 30 y.o. G3P1011 at [redacted]w[redacted]d being seen today for ongoing prenatal care.  She is currently monitored for the following issues for this low-risk pregnancy and has Former smoker; Asthma; Rh negative status during pregnancy; Previous cesarean delivery affecting pregnancy; Supervision of high risk pregnancy, antepartum; Obesity affecting pregnancy; Abnormal Pap smear of cervix; History of gestational diabetes in prior pregnancy, currently pregnant; and Positive urine drug screen on their problem list.  Patient reports general discomforts of pregnancy.  Contractions: Irregular. Vag. Bleeding: None.  Movement: Present. Denies leaking of fluid.   The following portions of the patient's history were reviewed and updated as appropriate: allergies, current medications, past family history, past medical history, past social history, past surgical history and problem list. Problem list updated.  Objective:   Vitals:   05/10/20 1034  BP: 115/79  Pulse: 88  Weight: 253 lb (114.8 kg)    Fetal Status:     Movement: Present     General:  Alert, oriented and cooperative. Patient is in no acute distress.  Skin: Skin is warm and dry. No rash noted.   Cardiovascular: Normal heart rate noted  Respiratory: Normal respiratory effort, no problems with respiration noted  Abdomen: Soft, gravid, appropriate for gestational age. Pain/Pressure: Present     Pelvic:  Cervical exam performed        Extremities: Normal range of motion.  Edema: Trace  Mental Status: Normal mood and affect. Normal behavior. Normal judgment and thought content.   Urinalysis:      Assessment and Plan:  Pregnancy: G3P1011 at [redacted]w[redacted]d  1. Supervision of high risk pregnancy, antepartum Labor precautions IOL scheduled 40-41 weeks NST next visit  Growth 79 % on 04/12/20  2. Previous cesarean delivery affecting pregnancy TOLAC, papers signed 03/27/20  3. Rh negative status during pregnancy in third  trimester S/P Rhogam  4. History of gestational diabetes in prior pregnancy, currently pregnant Normal glucola  Term labor symptoms and general obstetric precautions including but not limited to vaginal bleeding, contractions, leaking of fluid and fetal movement were reviewed in detail with the patient. Please refer to After Visit Summary for other counseling recommendations.  Return in about 1 week (around 05/17/2020) for OB visit, face to face, MD only, NST.   Hermina Staggers, MD

## 2020-05-11 ENCOUNTER — Telehealth (HOSPITAL_COMMUNITY): Payer: Self-pay | Admitting: *Deleted

## 2020-05-11 ENCOUNTER — Encounter (HOSPITAL_COMMUNITY): Payer: Self-pay

## 2020-05-11 NOTE — Telephone Encounter (Signed)
Preadmission screen  

## 2020-05-12 ENCOUNTER — Other Ambulatory Visit (HOSPITAL_COMMUNITY): Payer: BC Managed Care – PPO | Attending: Obstetrics and Gynecology

## 2020-05-14 ENCOUNTER — Inpatient Hospital Stay (HOSPITAL_COMMUNITY): Admission: AD | Admit: 2020-05-14 | Payer: BC Managed Care – PPO | Source: Home / Self Care | Admitting: Family Medicine

## 2020-05-14 ENCOUNTER — Inpatient Hospital Stay (HOSPITAL_COMMUNITY): Payer: BC Managed Care – PPO | Attending: Family Medicine

## 2020-05-14 ENCOUNTER — Telehealth: Payer: Self-pay | Admitting: Advanced Practice Midwife

## 2020-05-14 NOTE — Telephone Encounter (Signed)
Attempted to call pt on her home/mobile # [phone] to instruct her to come MAU entrance C for induction of labor. Number no longer in service. Attempted to call emergency contact number [phone]. Left VM to call L&D.

## 2020-05-15 NOTE — Progress Notes (Signed)
Attempted to call patient for induction of labor no answer voicemail left

## 2020-05-16 ENCOUNTER — Telehealth: Payer: Self-pay

## 2020-05-16 NOTE — Telephone Encounter (Signed)
Pt called stating that she needed to cancel her upcoming prenatal appointment. I advised her that was not a good idea as she would need to come in to reschedule the induction that she missed on Sunday. She states she does not have a working phone number right now. Advised patient she will need to attend her appointment so she can be put on the monitor to make sure baby is still doing ok and so we can get her scheduled for induction. Pt states she will "try to make it to the appointement." Stressed to patient the importance of coming to the scheduled appointment.

## 2020-05-18 ENCOUNTER — Ambulatory Visit (INDEPENDENT_AMBULATORY_CARE_PROVIDER_SITE_OTHER): Payer: BC Managed Care – PPO | Admitting: Obstetrics & Gynecology

## 2020-05-18 ENCOUNTER — Other Ambulatory Visit: Payer: Self-pay | Admitting: Obstetrics & Gynecology

## 2020-05-18 ENCOUNTER — Other Ambulatory Visit: Payer: Self-pay | Admitting: Advanced Practice Midwife

## 2020-05-18 ENCOUNTER — Other Ambulatory Visit: Payer: Self-pay

## 2020-05-18 VITALS — BP 119/76 | HR 88 | Wt 261.8 lb

## 2020-05-18 DIAGNOSIS — O0990 Supervision of high risk pregnancy, unspecified, unspecified trimester: Secondary | ICD-10-CM

## 2020-05-18 DIAGNOSIS — O99213 Obesity complicating pregnancy, third trimester: Secondary | ICD-10-CM

## 2020-05-18 DIAGNOSIS — O34219 Maternal care for unspecified type scar from previous cesarean delivery: Secondary | ICD-10-CM

## 2020-05-18 NOTE — Patient Instructions (Signed)
Labor Induction Labor induction is when steps are taken to cause a pregnant woman to begin the labor process. Most women go into labor on their own between 37 weeks and 42 weeks of pregnancy. When this does not happen, or when there is a medical need for labor to begin, steps may be taken to induce, or bring on, labor. Labor induction causes a pregnant woman's uterus to contract. It also causes the cervix to soften (ripen), open (dilate), and thin out. Usually, labor is not induced before 39 weeks of pregnancy unless there is a medical reason to do so. When is labor induction considered? Labor induction may be right for you if:  Your pregnancy lasts longer than 41 to 42 weeks.  Your placenta is separating from your uterus (placental abruption).  You have a rupture of membranes and your labor does not begin.  You have health problems, like diabetes or high blood pressure (preeclampsia) during your pregnancy.  Your baby has stopped growing or does not have enough amniotic fluid. Before labor induction begins, your health care provider will consider the following factors:  Your medical condition and the baby's condition.  How many weeks you have been pregnant.  How mature the baby's lungs are.  The condition of your cervix.  The position of the baby.  The size of your birth canal. Tell a health care provider about:  Any allergies you have.  All medicines you are taking, including vitamins, herbs, eye drops, creams, and over-the-counter medicines.  Any problems you or your family members have had with anesthetic medicines.  Any surgeries you have had.  Any blood disorders you have.  Any medical conditions you have. What are the risks? Generally, this is a safe procedure. However, problems may occur, including:  Failed induction.  Changes in fetal heart rate, such as being too high, too low, or irregular (erratic).  Infection in the mother or the baby.  Increased risk of  having a cesarean delivery.  Breaking off (abruption) of the placenta from the uterus. This is rare.  Rupture of the uterus. This is very rare.  Your baby could fail to get enough blood flow or oxygen. This can be life-threatening. When induction is needed for medical reasons, the benefits generally outweigh the risks. What happens during the procedure? During the procedure, your health care provider will use one of these methods to induce labor:  Stripping the membranes. In this method, the amniotic sac tissue is gently separated from the cervix. This causes the following to happen: ? Your cervix stretches, which in turn causes the release of prostaglandins. ? Prostaglandins induce labor and cause the uterus to contract. ? This procedure is often done in an office visit. You will be sent home to wait for contractions to begin.  Prostaglandin medicine. This medicine starts contractions and causes the cervix to dilate and ripen. This can be taken by mouth (orally) or by being inserted into the vagina (suppository).  Inserting a small, thin tube (catheter) with a balloon into the vagina and then expanding the balloon with water to dilate the cervix.  Breaking the water. In this method, a small instrument is used to make a small hole in the amniotic sac. This eventually causes the amniotic sac to break. Contractions should begin within a few hours.  Medicine to trigger or strengthen contractions. This medicine is given through an IV that is inserted into a vein in your arm. This procedure may vary among health care providers and hospitals.     Where to find more information  [employer]: www.marchofdimes.org  The American College of Obstetricians and Gynecologists: www.acog.org Summary  Labor induction causes a pregnant woman's uterus to contract. It also causes the cervix to soften (ripen), open (dilate), and thin out.  Labor is usually not induced before 39 weeks of pregnancy unless  there is a medical reason to do so.  When induction is needed for medical reasons, the benefits generally outweigh the risks.  Talk with your health care provider about which methods of labor induction are right for you. This information is not intended to replace advice given to you by your health care provider. Make sure you discuss any questions you have with your health care provider. Document Revised: 10/14/2019 Document Reviewed: 10/14/2019 Elsevier Patient Education  2021 Elsevier Inc.  

## 2020-05-18 NOTE — Progress Notes (Signed)
   PRENATAL VISIT NOTE  Subjective:  Mary Davies is a 30 y.o. G3P1011 at [redacted]w[redacted]d being seen today for ongoing prenatal care.  She is currently monitored for the following issues for this high-risk pregnancy and has Former smoker; Asthma; Rh negative status during pregnancy; Previous cesarean delivery affecting pregnancy; Supervision of high risk pregnancy, antepartum; Obesity affecting pregnancy; Abnormal Pap smear of cervix; History of gestational diabetes in prior pregnancy, currently pregnant; and Positive urine drug screen on their problem list.  Patient reports occasional contractions.   .  .   . Denies leaking of fluid.   The following portions of the patient's history were reviewed and updated as appropriate: allergies, current medications, past family history, past medical history, past social history, past surgical history and problem list.   Objective:  There were no vitals filed for this visit.  Fetal Status:           General:  Alert, oriented and cooperative. Patient is in no acute distress.  Skin: Skin is warm and dry. No rash noted.   Cardiovascular: Normal heart rate noted  Respiratory: Normal respiratory effort, no problems with respiration noted  Abdomen: Soft, gravid, appropriate for gestational age.        Pelvic: Cervical exam deferred        Extremities: Normal range of motion.     Mental Status: Normal mood and affect. Normal behavior. Normal judgment and thought content.   Assessment and Plan:  Pregnancy: G3P1011 at [redacted]w[redacted]d 1. Obesity affecting pregnancy in third trimester There is no height or weight on file to calculate BMI. 47.88 kg/m  2. Previous cesarean delivery affecting pregnancy Plans TOLAC  3. Supervision of high risk pregnancy, antepartum IOL by 41 weeks, NST reactive today  Term labor symptoms and general obstetric precautions including but not limited to vaginal bleeding, contractions, leaking of fluid and fetal movement were reviewed in  detail with the patient. Please refer to After Visit Summary for other counseling recommendations.   No follow-ups on file.  No future appointments.  Scheryl Darter, MD

## 2020-05-18 NOTE — Progress Notes (Signed)
Pt reports fetal movement with irregular contractions. Pt states that she did not know that she was suppose to be induced this past Sunday, which is why she missed it. Pt reports she has now been having irregular contractions all day anywhere from 6-10 minutes apart.

## 2020-05-19 ENCOUNTER — Encounter (HOSPITAL_COMMUNITY): Payer: Self-pay | Admitting: Obstetrics & Gynecology

## 2020-05-19 ENCOUNTER — Inpatient Hospital Stay (HOSPITAL_COMMUNITY)
Admission: AD | Admit: 2020-05-19 | Discharge: 2020-05-22 | DRG: 787 | Disposition: A | Discharge status: Home / Self Care | Payer: BC Managed Care – PPO | Attending: Obstetrics and Gynecology | Admitting: Obstetrics and Gynecology

## 2020-05-19 ENCOUNTER — Inpatient Hospital Stay (HOSPITAL_COMMUNITY): Admission: AD | Admit: 2020-05-19 | Payer: BC Managed Care – PPO | Source: Home / Self Care | Admitting: Family Medicine

## 2020-05-19 ENCOUNTER — Inpatient Hospital Stay (HOSPITAL_COMMUNITY): Payer: BC Managed Care – PPO

## 2020-05-19 ENCOUNTER — Inpatient Hospital Stay (HOSPITAL_COMMUNITY): Payer: BC Managed Care – PPO | Admitting: Anesthesiology

## 2020-05-19 DIAGNOSIS — Z6791 Unspecified blood type, Rh negative: Secondary | ICD-10-CM

## 2020-05-19 DIAGNOSIS — Z87891 Personal history of nicotine dependence: Secondary | ICD-10-CM

## 2020-05-19 DIAGNOSIS — O99210 Obesity complicating pregnancy, unspecified trimester: Secondary | ICD-10-CM | POA: Diagnosis present

## 2020-05-19 DIAGNOSIS — O26893 Other specified pregnancy related conditions, third trimester: Secondary | ICD-10-CM | POA: Diagnosis present

## 2020-05-19 DIAGNOSIS — R87619 Unspecified abnormal cytological findings in specimens from cervix uteri: Secondary | ICD-10-CM | POA: Diagnosis present

## 2020-05-19 DIAGNOSIS — F1410 Cocaine abuse, uncomplicated: Secondary | ICD-10-CM | POA: Diagnosis present

## 2020-05-19 DIAGNOSIS — O99213 Obesity complicating pregnancy, third trimester: Secondary | ICD-10-CM

## 2020-05-19 DIAGNOSIS — F1210 Cannabis abuse, uncomplicated: Secondary | ICD-10-CM | POA: Diagnosis present

## 2020-05-19 DIAGNOSIS — O34219 Maternal care for unspecified type scar from previous cesarean delivery: Secondary | ICD-10-CM | POA: Diagnosis present

## 2020-05-19 DIAGNOSIS — O9952 Diseases of the respiratory system complicating childbirth: Secondary | ICD-10-CM | POA: Diagnosis present

## 2020-05-19 DIAGNOSIS — O0990 Supervision of high risk pregnancy, unspecified, unspecified trimester: Secondary | ICD-10-CM

## 2020-05-19 DIAGNOSIS — J45909 Unspecified asthma, uncomplicated: Secondary | ICD-10-CM | POA: Diagnosis present

## 2020-05-19 DIAGNOSIS — Z20822 Contact with and (suspected) exposure to covid-19: Secondary | ICD-10-CM | POA: Diagnosis present

## 2020-05-19 DIAGNOSIS — O34211 Maternal care for low transverse scar from previous cesarean delivery: Secondary | ICD-10-CM | POA: Diagnosis present

## 2020-05-19 DIAGNOSIS — O99214 Obesity complicating childbirth: Secondary | ICD-10-CM | POA: Diagnosis present

## 2020-05-19 DIAGNOSIS — Z8632 Personal history of gestational diabetes: Secondary | ICD-10-CM

## 2020-05-19 DIAGNOSIS — O99324 Drug use complicating childbirth: Secondary | ICD-10-CM | POA: Diagnosis present

## 2020-05-19 DIAGNOSIS — Z3A40 40 weeks gestation of pregnancy: Secondary | ICD-10-CM

## 2020-05-19 DIAGNOSIS — O480 Post-term pregnancy: Secondary | ICD-10-CM | POA: Diagnosis present

## 2020-05-19 DIAGNOSIS — R825 Elevated urine levels of drugs, medicaments and biological substances: Secondary | ICD-10-CM | POA: Diagnosis present

## 2020-05-19 DIAGNOSIS — O09299 Supervision of pregnancy with other poor reproductive or obstetric history, unspecified trimester: Secondary | ICD-10-CM

## 2020-05-19 DIAGNOSIS — O26899 Other specified pregnancy related conditions, unspecified trimester: Secondary | ICD-10-CM

## 2020-05-19 LAB — COMPREHENSIVE METABOLIC PANEL
ALT: 10 U/L (ref 0–44)
AST: 16 U/L (ref 15–41)
Albumin: 2.7 g/dL — ABNORMAL LOW (ref 3.5–5.0)
Alkaline Phosphatase: 141 U/L — ABNORMAL HIGH (ref 38–126)
Anion gap: 10 (ref 5–15)
BUN: 7 mg/dL (ref 6–20)
CO2: 21 mmol/L — ABNORMAL LOW (ref 22–32)
Calcium: 8.8 mg/dL — ABNORMAL LOW (ref 8.9–10.3)
Chloride: 103 mmol/L (ref 98–111)
Creatinine, Ser: 0.66 mg/dL (ref 0.44–1.00)
GFR, Estimated: >60 mL/min (ref >60)
Glucose, Bld: 172 mg/dL — ABNORMAL HIGH (ref 70–99)
Potassium: 3.4 mmol/L — ABNORMAL LOW (ref 3.5–5.1)
Sodium: 134 mmol/L — ABNORMAL LOW (ref 135–145)
Total Bilirubin: 0.7 mg/dL (ref 0.3–1.2)
Total Protein: 6 g/dL — ABNORMAL LOW (ref 6.5–8.1)

## 2020-05-19 LAB — RESP PANEL BY RT-PCR (FLU A&B, COVID) ARPGX2
Influenza A by PCR: NEGATIVE
Influenza B by PCR: NEGATIVE
SARS Coronavirus 2 by RT PCR: NEGATIVE

## 2020-05-19 LAB — PROTEIN / CREATININE RATIO, URINE
Creatinine, Urine: 191.57 mg/dL
Protein Creatinine Ratio: 0.13 mg/mg{Cre} (ref 0.00–0.15)
Total Protein, Urine: 25 mg/dL

## 2020-05-19 LAB — CBC
HCT: 40.7 % (ref 36.0–46.0)
Hemoglobin: 13.4 g/dL (ref 12.0–15.0)
MCH: 33.3 pg (ref 26.0–34.0)
MCHC: 32.9 g/dL (ref 30.0–36.0)
MCV: 101 fL — ABNORMAL HIGH (ref 80.0–100.0)
Platelets: 228 10*3/uL (ref 150–400)
RBC: 4.03 MIL/uL (ref 3.87–5.11)
RDW: 15.7 % — ABNORMAL HIGH (ref 11.5–15.5)
WBC: 9.8 10*3/uL (ref 4.0–10.5)
nRBC: 0 % (ref 0.0–0.2)

## 2020-05-19 LAB — TYPE AND SCREEN
ABO/RH(D): O NEG
Antibody Screen: POSITIVE

## 2020-05-19 LAB — GLUCOSE, CAPILLARY: Glucose-Capillary: 91 mg/dL (ref 70–99)

## 2020-05-19 MED ORDER — LIDOCAINE HCL (PF) 1 % IJ SOLN
INTRAMUSCULAR | Status: DC | PRN
  Administered 2020-05-19: 6 mL via EPIDURAL

## 2020-05-19 MED ORDER — FENTANYL-BUPIVACAINE-NACL 0.5-0.125-0.9 MG/250ML-% EP SOLN
EPIDURAL | Status: AC
  Filled 2020-05-19: qty 250

## 2020-05-19 MED ORDER — LEVONORGESTREL 20.1 MCG/DAY IU IUD: 1.0000 | INTRAUTERINE_SYSTEM | Freq: Once | INTRAUTERINE | Status: DC

## 2020-05-19 MED ORDER — PHENYLEPHRINE 40 MCG/ML (10ML) SYRINGE FOR IV PUSH (FOR BLOOD PRESSURE SUPPORT): 80.0000 ug | PREFILLED_SYRINGE | INTRAVENOUS | Status: DC | PRN

## 2020-05-19 MED ORDER — EPHEDRINE 5 MG/ML INJ: 10.0000 mg | INTRAVENOUS | Status: DC | PRN

## 2020-05-19 MED ORDER — ONDANSETRON HCL 4 MG/2ML IJ SOLN: 4.0000 mg | Freq: Four times a day (QID) | INTRAMUSCULAR | Status: DC | PRN

## 2020-05-19 MED ORDER — OXYCODONE-ACETAMINOPHEN 5-325 MG PO TABS: 2.0000 | ORAL_TABLET | ORAL | Status: DC | PRN

## 2020-05-19 MED ORDER — FENTANYL CITRATE (PF) 100 MCG/2ML IJ SOLN
50.0000 ug | INTRAMUSCULAR | Status: DC | PRN
Start: 2020-05-19 — End: 2020-05-20

## 2020-05-19 MED ORDER — ACETAMINOPHEN 325 MG PO TABS: 650.0000 mg | ORAL_TABLET | ORAL | Status: DC | PRN

## 2020-05-19 MED ORDER — FENTANYL-BUPIVACAINE-NACL 0.5-0.125-0.9 MG/250ML-% EP SOLN: 12.0000 mL/h | EPIDURAL | Status: DC | PRN

## 2020-05-19 MED ORDER — LACTATED RINGERS IV SOLN: 500.0000 mL | Freq: Once | INTRAVENOUS | Status: DC

## 2020-05-19 MED ORDER — FUROSEMIDE 10 MG/ML IJ SOLN
20.0000 mg | Freq: Once | INTRAMUSCULAR | Status: AC
  Administered 2020-05-19: 20 mg via INTRAVENOUS
  Filled 2020-05-19: qty 2

## 2020-05-19 MED ORDER — LIDOCAINE HCL (PF) 1 % IJ SOLN: 30.0000 mL | INTRAMUSCULAR | Status: DC | PRN

## 2020-05-19 MED ORDER — OXYTOCIN BOLUS FROM INFUSION: 333.0000 mL | Freq: Once | INTRAVENOUS | Status: DC

## 2020-05-19 MED ORDER — OXYCODONE-ACETAMINOPHEN 5-325 MG PO TABS: 1.0000 | ORAL_TABLET | ORAL | Status: DC | PRN

## 2020-05-19 MED ORDER — DIPHENHYDRAMINE HCL 50 MG/ML IJ SOLN: 12.5000 mg | INTRAMUSCULAR | Status: DC | PRN

## 2020-05-19 MED ORDER — LACTATED RINGERS IV SOLN: INTRAVENOUS | Status: DC

## 2020-05-19 MED ORDER — LACTATED RINGERS IV SOLN: 500.0000 mL | INTRAVENOUS | Status: DC | PRN

## 2020-05-19 MED ORDER — OXYTOCIN-SODIUM CHLORIDE 30-0.9 UT/500ML-% IV SOLN
2.5000 [IU]/h | INTRAVENOUS | Status: DC
Start: 2020-05-19 — End: 2020-05-20
  Filled 2020-05-19: qty 500

## 2020-05-19 MED ORDER — FENTANYL-BUPIVACAINE-NACL 0.5-0.125-0.9 MG/250ML-% EP SOLN
EPIDURAL | Status: DC | PRN
  Administered 2020-05-19: 12 mL/h via EPIDURAL

## 2020-05-19 MED ORDER — TERBUTALINE SULFATE 1 MG/ML IJ SOLN
0.2500 mg | Freq: Once | INTRAMUSCULAR | Status: AC | PRN
  Administered 2020-05-20: 0.25 mg via SUBCUTANEOUS
  Filled 2020-05-19: qty 1

## 2020-05-19 MED ORDER — SOD CITRATE-CITRIC ACID 500-334 MG/5ML PO SOLN: 30.0000 mL | ORAL | Status: DC | PRN

## 2020-05-19 MED ORDER — OXYTOCIN-SODIUM CHLORIDE 30-0.9 UT/500ML-% IV SOLN
1.0000 m[IU]/min | INTRAVENOUS | Status: DC
  Administered 2020-05-19: 2 m[IU]/min via INTRAVENOUS

## 2020-05-19 NOTE — H&P (Signed)
OBSTETRIC ADMISSION HISTORY AND PHYSICAL  Maila Dukes is a 30 y.o. female G3P1011 with IUP at [redacted]w[redacted]d presenting for IOL for post dates, TOLAC. She reports +FMs. Reports regular painful ctx since yesterday. No LOF, VB, blurry vision, headaches, peripheral edema, or RUQ pain. She plans on formula feeding. She requests IUD for birth control.  Dating: By 9w [place] --->  Estimated Date of Delivery: 05/13/20  Sono:    @[redacted]w[redacted]d , normal anatomy, ceph presentation, 2123g, 79%ile, EFW 4'11   Prenatal History/Complications: - drug abuse, cocaine and MJ - obesity - post dates - previous CS - abnormal papsmear - Rh negative - Asthma  Past Medical History: Past Medical History:  Diagnosis Date  . Asthma    prn inhaler  . Closed left radial fracture 01/15/2011  . Depression    hx of, fine now  . Fracture of metatarsal bone of left foot 04/22/2015   5th metatarsal  . Left breast abscess 04/2015    Past Surgical History: Past Surgical History:  Procedure Laterality Date  . CESAREAN SECTION N/A 05/08/2017   Procedure: CESAREAN SECTION;  Surgeon: 05/10/2017, MD;  Location: [facility];  Service: Obstetrics;  Laterality: N/A;  . INCISION AND DRAINAGE ABSCESS Left 09/24/2012   Procedure: INCISION AND DRAINAGE BREAST ABSCESS;  Surgeon: 11/24/2012, MD;  Location: [facility] OR;  Service: General;  Laterality: Left;  . INCISION AND DRAINAGE ABSCESS Left 05/08/2015   Procedure: INCISION AND DRAINAGE LEFT BREAST  ABSCESS;  Surgeon: 05/10/2015 III, MD;  Location: [facility];  Service: General;  Laterality: Left;    Obstetrical History: OB History    Gravida  3   Para  1   Term  1   Preterm  0   AB  1   Living  1     SAB  1   IAB  0   Ectopic  0   Multiple  0   Live Births  1        Obstetric Comments  C/s for FTP, got to 5cm         Social History: Social History   Socioeconomic History  . Marital status: Single    Spouse name: Not on file  .  Number of children: Not on file  . Years of education: Not on file  . Highest education level: Not on file  Occupational History  . Not on file  Tobacco Use  . Smoking status: Former Smoker    Packs/day: 0.25    Years: 10.00    Pack years: 2.50    Types: Cigarettes    Quit date: 12/14/2019    Years since quitting: 0.4  . Smokeless tobacco: Never Used  . Tobacco comment: trying to quit  Vaping Use  . Vaping Use: Never used  Substance and Sexual Activity  . Alcohol use: No  . Drug use: No  . Sexual activity: Yes    Birth control/protection: None  Other Topics Concern  . Not on file  Social History Narrative  . Not on file   Social Determinants of Health   Financial Resource Strain: Not on file  Food Insecurity: Not on file  Transportation Needs: Not on file  Physical Activity: Not on file  Stress: Not on file  Social Connections: Not on file    Family History: Family History  Problem Relation Age of Onset  . Diabetes Mother   . Stroke Mother   . Asthma Father  Allergies: Allergies  Allergen Reactions  . Pineapple Shortness Of Breath, Itching and Swelling    Medications Prior to Admission  Medication Sig Dispense Refill Last Dose  . acetaminophen (TYLENOL) 325 MG tablet Take 650 mg by mouth every 6 (six) hours as needed.     . Prenatal MV & Min w/FA-DHA (PRENATAL GUMMIES) 0.18-25 MG CHEW Chew by mouth. (Patient not taking: Reported on 05/18/2020)      Review of Systems:  All systems reviewed and negative except as stated in HPI  PE: Blood pressure 131/82, pulse 90, temperature 97.6 F (36.4 C), temperature source Oral, resp. rate 18, last menstrual period 07/15/2019, SpO2 100 %, unknown if currently breastfeeding. General appearance: alert, cooperative and no distress Lungs: regular rate and effort Heart: regular rate  Abdomen: soft, non-tender Extremities: Homans sign is negative, no sign of DVT Presentation: cephalic by BSUS EFM: 130 bpm, mod  variability, + accels, no decels Toco: irreg SVE: 3.5/70 per RN  Prenatal labs: ABO, Rh: O/Negative/-- (11/04 1410) Antibody: Negative (11/04 1410) Rubella: 6.39 (11/04 1410) RPR: Non Reactive (02/18 0925)  HBsAg: Negative (11/04 1410)  HIV: Non Reactive (02/18 0925)  GBS: Negative/-- (03/30 0157)  2 hr GTT normal  Prenatal Transfer Tool  Maternal Diabetes: No Genetic Screening: Declined Maternal Ultrasounds/Referrals: Normal Fetal Ultrasounds or other Referrals:  None Maternal Substance Abuse:  Yes:  Type: Marijuana, Cocaine Significant Maternal Medications:  None Significant Maternal Lab Results: Group B Strep negative  No results found for this or any previous visit (from the past 24 hour(s)).  Patient Active Problem List   Diagnosis Date Noted  . Positive urine drug screen 12/16/2019  . Obesity affecting pregnancy 11/24/2019  . Abnormal Pap smear of cervix 11/24/2019  . History of gestational diabetes in prior pregnancy, currently pregnant 11/24/2019  . Supervision of high risk pregnancy, antepartum 11/18/2019  . Previous cesarean delivery affecting pregnancy 05/10/2017  . Asthma 10/30/2016  . Rh negative status during pregnancy 10/30/2016  . Former smoker 07/14/2012    Assessment: Beretta Ginsberg is a 30 y.o. G3P1011 at [redacted]w[redacted]d here for IOL for post-dates, TOLAC  1. Labor: latent 2. FWB: Cat I 3. Pain: analgesia/anesthesia prn 4. GBS: neg   Plan: Admit to LD Cervical ripening Anticipate VBAC- consent signed  Donette Larry, CNM  05/19/2020, 12:34 PM

## 2020-05-19 NOTE — Progress Notes (Signed)
Labor Progress Note Khyli Swaim is a 30 y.o. G3P1011 at [redacted]w[redacted]d presented for SOL. S: Doing well, sleeping.  O:  BP (!) 126/56   Pulse 74   Temp 98.3 F (36.8 C) (Oral)   Resp 16   LMP 07/15/2019   SpO2 98%  EFM: baseline 130bpm/mod variability/+ accels/no decels Toco: difficult to trace  CVE: Dilation: 6 Effacement (%): 80 Station: -3 Presentation: Vertex Exam by:: Germaine Pomfret, MD   A&P: 30 y.o. [id number] [redacted]w[redacted]d presented for SOL. #TOLAC: Progressing well. Continue expectant management. Head ballotable so AROM deferred. #Pain: epidural #FWB: cat 1 #GBS negative #Rh neg: Rhogam workup postpartum. #Edema: Diffuse edema noted on abdomen and legs. Good UOP with lasix. Asymptomatic otherwise. preE labs normal. Will re-dose lasix. #Drug use: 11/2019 UDS+ THC/cocaine, patient continues to use THC, denies any recent cocaine use. SW postpartum. #Contraception: desires post placental liletta, discussed risks/benefits with patient and consent form signed/ordered.  Alric Seton, MD 6:10 PM

## 2020-05-19 NOTE — MAU Note (Signed)
Mary Davies is a 30 y.o. at [redacted]w[redacted]d here in MAU reporting: contractions since yesterday, states they are more regular and painful now. They are every 5 minutes. Denies bleeding or LOF. +FM. Scheduled for induction today.  Onset of complaint: today  Pain score: 8/10  Vitals:   05/19/20 1143  BP: 131/82  Pulse: 90  Resp: 18  Temp: 97.6 F (36.4 C)  SpO2: 100%     FHT: EFM applied in room  Lab orders placed from triage: none

## 2020-05-19 NOTE — Progress Notes (Signed)
Labor Progress Note Mary Davies is a 30 y.o. G3P1011 at [redacted]w[redacted]d presented for SOL. S: Doing well, complaining of painful contractions.  O:  BP 131/82 (BP Location: Right Arm)   Pulse 90   Temp 97.6 F (36.4 C) (Oral)   Resp 18   LMP 07/15/2019   SpO2 100% Comment: room air EFM: baseline 150bpm/mod variability/+ accels/no decels Toco: q5-7 min  CVE: Dilation: 6 Effacement (%): 80 Station: -3 Presentation: Vertex Exam by:: Germaine Pomfret, MD   A&P: 30 y.o. [id number] [redacted]w[redacted]d presented for SOL. #TOLAC: Progressing well. Continue expectant management, will AROM at next check if able. #Pain: desires epidural #FWB: cat 1 #GBS negative #Rh neg: Rhogam workup postpartum. #Edema: Diffuse edema noted on abdomen and legs, will check preE labs although BP mildly elevated. Asymptomatic otherwise. Will give lasix 20mg  IV for comfort. #Drug use: 11/2019 UDS+ THC/cocaine, patient continues to use THC, denies any recent cocaine use. SW postpartum. #Contraception: desires post placental liletta, discussed risks/benefits with patient and consent form signed/ordered.  12/2019, MD 1:37 PM

## 2020-05-19 NOTE — Anesthesia Procedure Notes (Signed)
Epidural Patient location during procedure: OB Start time: 05/19/2020 2:17 PM End time: 05/19/2020 2:20 PM  Staffing Anesthesiologist: Bethena Midget, MD  Preanesthetic Checklist Completed: patient identified, IV checked, site marked, risks and benefits discussed, surgical consent, monitors and equipment checked, pre-op evaluation and timeout performed  Epidural Patient position: sitting Prep: DuraPrep and site prepped and draped Patient monitoring: continuous pulse ox and blood pressure Approach: midline Location: L3-L4 Injection technique: LOR air  Needle:  Needle type: Tuohy  Needle gauge: 17 G Needle length: 9 cm and 9 Needle insertion depth: 6 cm Catheter type: closed end flexible Catheter size: 19 Gauge Catheter at skin depth: 11 cm Test dose: negative  Assessment Events: blood not aspirated, injection not painful, no injection resistance, no paresthesia and negative IV test

## 2020-05-19 NOTE — Progress Notes (Addendum)
Labor Progress Note Mary Davies is a 29 y.o. G3P1011 at [redacted]w[redacted]d presented for IOL for post dates, TOLAC.  S: Patient resting comfortably, not feeling contractions.  O:  BP (!) 121/46   Pulse 69   Temp 98.4 F (36.9 C) (Oral)   Resp 20   LMP 07/15/2019   SpO2 98%  EFM: 120/Moderate Variability/ +Accels/ + 1 early deccel  CVE: Dilation: 7.5 Effacement (%): 90 Station: -2 Presentation: Vertex Exam by:: Dr. Myriam Jacobson   A&P: 30 y.o. [id number] [redacted]w[redacted]d presented for IOL for post dates, TOLAC.  #Labor: Progressing well. AROM performed and IUPC placed.  Will consider starting Pitocin if inadequate contractions. #Pain: Epidural in place #FWB: Cat 1 #GBS negative  Mary Kussmaul, MD 10:09 PM

## 2020-05-19 NOTE — Anesthesia Preprocedure Evaluation (Addendum)
Anesthesia Evaluation  Patient identified by MRN, date of birth, ID band Patient awake    Reviewed: Allergy & Precautions, H&P , NPO status , Patient's Chart, lab work & pertinent test results  History of Anesthesia Complications Negative for: history of anesthetic complications  Airway Mallampati: II  TM Distance: >3 FB Neck ROM: full    Dental no notable dental hx.    Pulmonary asthma , former smoker,    Pulmonary exam normal        Cardiovascular negative cardio ROS Normal cardiovascular exam Rhythm:regular Rate:Normal     Neuro/Psych negative neurological ROS  negative psych ROS   GI/Hepatic negative GI ROS, Neg liver ROS,   Endo/Other  Morbid obesity  Renal/GU      Musculoskeletal   Abdominal   Peds  Hematology negative hematology ROS (+)   Anesthesia Other Findings   Reproductive/Obstetrics (+) Pregnancy                             Anesthesia Physical Anesthesia Plan  ASA: III and emergent  Anesthesia Plan: Epidural   Post-op Pain Management:    Induction:   PONV Risk Score and Plan:   Airway Management Planned:   Additional Equipment:   Intra-op Plan:   Post-operative Plan:   Informed Consent: I have reviewed the patients History and Physical, chart, labs and discussed the procedure including the risks, benefits and alternatives for the proposed anesthesia with the patient or authorized representative who has indicated his/her understanding and acceptance.       Plan Discussed with:   Anesthesia Plan Comments: (To OR for C section STAT for fetal bradycardia. Labor epidural in place and functioning well. )       Anesthesia Quick Evaluation

## 2020-05-20 ENCOUNTER — Encounter (HOSPITAL_COMMUNITY)
Admission: AD | Disposition: A | Discharge status: Home / Self Care | Payer: Self-pay | Source: Home / Self Care | Attending: Obstetrics and Gynecology

## 2020-05-20 ENCOUNTER — Encounter (HOSPITAL_COMMUNITY): Payer: Self-pay | Admitting: Obstetrics and Gynecology

## 2020-05-20 DIAGNOSIS — Z3A40 40 weeks gestation of pregnancy: Secondary | ICD-10-CM

## 2020-05-20 DIAGNOSIS — O34211 Maternal care for low transverse scar from previous cesarean delivery: Secondary | ICD-10-CM

## 2020-05-20 DIAGNOSIS — O480 Post-term pregnancy: Secondary | ICD-10-CM

## 2020-05-20 LAB — CBC
HCT: 39.7 % (ref 36.0–46.0)
Hemoglobin: 13.3 g/dL (ref 12.0–15.0)
MCH: 33.4 pg (ref 26.0–34.0)
MCHC: 33.5 g/dL (ref 30.0–36.0)
MCV: 99.7 fL (ref 80.0–100.0)
Platelets: 204 10*3/uL (ref 150–400)
RBC: 3.98 MIL/uL (ref 3.87–5.11)
RDW: 15.4 % (ref 11.5–15.5)
WBC: 17.9 10*3/uL — ABNORMAL HIGH (ref 4.0–10.5)
nRBC: 0 % (ref 0.0–0.2)

## 2020-05-20 LAB — RAPID URINE DRUG SCREEN, HOSP PERFORMED
Amphetamines: NOT DETECTED
Barbiturates: NOT DETECTED
Benzodiazepines: NOT DETECTED
Cocaine: NOT DETECTED
Opiates: NOT DETECTED
Tetrahydrocannabinol: POSITIVE — AB

## 2020-05-20 LAB — RPR: RPR Ser Ql: NONREACTIVE

## 2020-05-20 LAB — CREATININE, SERUM
Creatinine, Ser: 0.64 mg/dL (ref 0.44–1.00)
GFR, Estimated: >60 mL/min (ref >60)

## 2020-05-20 SURGERY — Surgical Case
Anesthesia: Epidural | Wound class: Clean Contaminated

## 2020-05-20 MED ORDER — NALBUPHINE HCL 10 MG/ML IJ SOLN: 5.0000 mg | Freq: Once | INTRAMUSCULAR | Status: DC | PRN

## 2020-05-20 MED ORDER — FENTANYL CITRATE (PF) 100 MCG/2ML IJ SOLN
INTRAMUSCULAR | Status: AC
  Filled 2020-05-20: qty 2

## 2020-05-20 MED ORDER — ONDANSETRON HCL 4 MG/2ML IJ SOLN
INTRAMUSCULAR | Status: DC | PRN
  Administered 2020-05-20: 4 mg via INTRAVENOUS

## 2020-05-20 MED ORDER — SODIUM CHLORIDE 0.9 % IV SOLN
500.0000 mg | Freq: Once | INTRAVENOUS | Status: AC
  Administered 2020-05-20: 500 mg via INTRAVENOUS
  Filled 2020-05-20: qty 500

## 2020-05-20 MED ORDER — NALBUPHINE HCL 10 MG/ML IJ SOLN: 5.0000 mg | INTRAMUSCULAR | Status: DC | PRN

## 2020-05-20 MED ORDER — HYDROMORPHONE HCL 1 MG/ML IJ SOLN: 0.2500 mg | INTRAMUSCULAR | Status: DC | PRN

## 2020-05-20 MED ORDER — ZOLPIDEM TARTRATE 5 MG PO TABS: 5.0000 mg | ORAL_TABLET | Freq: Every evening | ORAL | Status: DC | PRN

## 2020-05-20 MED ORDER — SIMETHICONE 80 MG PO CHEW: 80.0000 mg | CHEWABLE_TABLET | ORAL | Status: DC | PRN

## 2020-05-20 MED ORDER — KETOROLAC TROMETHAMINE 30 MG/ML IJ SOLN
30.0000 mg | Freq: Four times a day (QID) | INTRAMUSCULAR | Status: DC | PRN
  Administered 2020-05-20: 30 mg via INTRAVENOUS

## 2020-05-20 MED ORDER — NALOXONE HCL 4 MG/10ML IJ SOLN
1.0000 ug/kg/h | INTRAVENOUS | Status: DC | PRN
  Filled 2020-05-20: qty 5

## 2020-05-20 MED ORDER — KETOROLAC TROMETHAMINE 30 MG/ML IJ SOLN
30.0000 mg | Freq: Four times a day (QID) | INTRAMUSCULAR | Status: AC
  Administered 2020-05-20 – 2020-05-21 (×2): 30 mg via INTRAVENOUS
  Filled 2020-05-20 (×3): qty 1

## 2020-05-20 MED ORDER — SODIUM CHLORIDE 0.9% FLUSH: 3.0000 mL | INTRAVENOUS | Status: DC | PRN

## 2020-05-20 MED ORDER — ACETAMINOPHEN 500 MG PO TABS
1000.0000 mg | ORAL_TABLET | Freq: Three times a day (TID) | ORAL | Status: DC
  Administered 2020-05-20 – 2020-05-22 (×7): 1000 mg via ORAL
  Filled 2020-05-20 (×7): qty 2

## 2020-05-20 MED ORDER — OXYCODONE HCL 5 MG PO TABS
5.0000 mg | ORAL_TABLET | ORAL | Status: DC | PRN
  Administered 2020-05-20: 5 mg via ORAL
  Administered 2020-05-20 – 2020-05-21 (×4): 10 mg via ORAL
  Administered 2020-05-21: 5 mg via ORAL
  Administered 2020-05-22: 10 mg via ORAL
  Filled 2020-05-20: qty 2
  Filled 2020-05-20: qty 1
  Filled 2020-05-20 (×4): qty 2
  Filled 2020-05-20: qty 1

## 2020-05-20 MED ORDER — DEXAMETHASONE SODIUM PHOSPHATE 10 MG/ML IJ SOLN
INTRAMUSCULAR | Status: DC | PRN
  Administered 2020-05-20: 10 mg via INTRAVENOUS

## 2020-05-20 MED ORDER — COCONUT OIL OIL
1.0000 | TOPICAL_OIL | Status: DC | PRN
Start: 2020-05-20 — End: 2020-05-22

## 2020-05-20 MED ORDER — DIPHENHYDRAMINE HCL 25 MG PO CAPS
25.0000 mg | ORAL_CAPSULE | Freq: Four times a day (QID) | ORAL | Status: DC | PRN
Start: 2020-05-20 — End: 2020-05-22

## 2020-05-20 MED ORDER — MENTHOL 3 MG MT LOZG: 1.0000 | LOZENGE | OROMUCOSAL | Status: DC | PRN

## 2020-05-20 MED ORDER — OXYCODONE HCL 5 MG/5ML PO SOLN: 5.0000 mg | Freq: Once | ORAL | Status: DC | PRN

## 2020-05-20 MED ORDER — PRENATAL MULTIVITAMIN CH
1.0000 | ORAL_TABLET | Freq: Every day | ORAL | Status: DC
  Administered 2020-05-21 – 2020-05-22 (×2): 1 via ORAL
  Filled 2020-05-20 (×3): qty 1

## 2020-05-20 MED ORDER — KETOROLAC TROMETHAMINE 30 MG/ML IJ SOLN
30.0000 mg | Freq: Four times a day (QID) | INTRAMUSCULAR | Status: DC | PRN
  Administered 2020-05-20: 30 mg via INTRAMUSCULAR

## 2020-05-20 MED ORDER — ENOXAPARIN SODIUM 60 MG/0.6ML IJ SOSY
0.5000 mg/kg | PREFILLED_SYRINGE | INTRAMUSCULAR | Status: DC
  Administered 2020-05-20 – 2020-05-21 (×2): 60 mg via SUBCUTANEOUS
  Filled 2020-05-20 (×2): qty 0.6

## 2020-05-20 MED ORDER — DIBUCAINE (PERIANAL) 1 % EX OINT: 1.0000 "application " | TOPICAL_OINTMENT | CUTANEOUS | Status: DC | PRN

## 2020-05-20 MED ORDER — WITCH HAZEL-GLYCERIN EX PADS: 1.0000 "application " | MEDICATED_PAD | CUTANEOUS | Status: DC | PRN

## 2020-05-20 MED ORDER — SCOPOLAMINE 1 MG/3DAYS TD PT72: 1.0000 | MEDICATED_PATCH | Freq: Once | TRANSDERMAL | Status: DC

## 2020-05-20 MED ORDER — MEPERIDINE HCL 25 MG/ML IJ SOLN: 6.2500 mg | INTRAMUSCULAR | Status: DC | PRN

## 2020-05-20 MED ORDER — IBUPROFEN 600 MG PO TABS
600.0000 mg | ORAL_TABLET | Freq: Four times a day (QID) | ORAL | Status: DC
  Administered 2020-05-21 – 2020-05-22 (×5): 600 mg via ORAL
  Filled 2020-05-20 (×5): qty 1

## 2020-05-20 MED ORDER — OXYTOCIN-SODIUM CHLORIDE 30-0.9 UT/500ML-% IV SOLN
INTRAVENOUS | Status: DC | PRN
  Administered 2020-05-20: 400 mL via INTRAVENOUS

## 2020-05-20 MED ORDER — FENTANYL CITRATE (PF) 100 MCG/2ML IJ SOLN
INTRAMUSCULAR | Status: DC | PRN
  Administered 2020-05-20 (×3): 50 ug via INTRAVENOUS

## 2020-05-20 MED ORDER — LACTATED RINGERS IV SOLN: INTRAVENOUS | Status: DC | PRN

## 2020-05-20 MED ORDER — NALOXONE HCL 0.4 MG/ML IJ SOLN: 0.4000 mg | INTRAMUSCULAR | Status: DC | PRN

## 2020-05-20 MED ORDER — SIMETHICONE 80 MG PO CHEW
80.0000 mg | CHEWABLE_TABLET | Freq: Three times a day (TID) | ORAL | Status: DC
  Administered 2020-05-20 – 2020-05-22 (×6): 80 mg via ORAL
  Filled 2020-05-20 (×6): qty 1

## 2020-05-20 MED ORDER — PHENYLEPHRINE HCL (PRESSORS) 10 MG/ML IV SOLN
INTRAVENOUS | Status: DC | PRN
  Administered 2020-05-20 (×7): 80 ug via INTRAVENOUS

## 2020-05-20 MED ORDER — SODIUM CHLORIDE 0.9 % IV SOLN
INTRAVENOUS | Status: AC
  Filled 2020-05-20: qty 500

## 2020-05-20 MED ORDER — LIDOCAINE-EPINEPHRINE (PF) 2 %-1:200000 IJ SOLN
INTRAMUSCULAR | Status: DC | PRN
  Administered 2020-05-20 (×2): 5 mL via EPIDURAL

## 2020-05-20 MED ORDER — OXYCODONE HCL 5 MG PO TABS: 5.0000 mg | ORAL_TABLET | Freq: Once | ORAL | Status: DC | PRN

## 2020-05-20 MED ORDER — LACTATED RINGERS IV SOLN: INTRAVENOUS | Status: DC

## 2020-05-20 MED ORDER — SENNOSIDES-DOCUSATE SODIUM 8.6-50 MG PO TABS
2.0000 | ORAL_TABLET | ORAL | Status: DC
  Administered 2020-05-21 – 2020-05-22 (×2): 2 via ORAL
  Filled 2020-05-20 (×2): qty 2

## 2020-05-20 MED ORDER — TETANUS-DIPHTH-ACELL PERTUSSIS 5-2.5-18.5 LF-MCG/0.5 IM SUSY: 0.5000 mL | PREFILLED_SYRINGE | Freq: Once | INTRAMUSCULAR | Status: DC

## 2020-05-20 MED ORDER — DIPHENHYDRAMINE HCL 50 MG/ML IJ SOLN: 12.5000 mg | INTRAMUSCULAR | Status: DC | PRN

## 2020-05-20 MED ORDER — ONDANSETRON HCL 4 MG/2ML IJ SOLN: 4.0000 mg | Freq: Once | INTRAMUSCULAR | Status: DC | PRN

## 2020-05-20 MED ORDER — MORPHINE SULFATE (PF) 0.5 MG/ML IJ SOLN
INTRAMUSCULAR | Status: AC
  Filled 2020-05-20: qty 10

## 2020-05-20 MED ORDER — DEXTROSE 5 % IV SOLN
INTRAVENOUS | Status: DC | PRN
  Administered 2020-05-20: 3 g via INTRAVENOUS

## 2020-05-20 MED ORDER — SODIUM CHLORIDE 0.9 % IR SOLN
Status: DC | PRN
  Administered 2020-05-20: 1

## 2020-05-20 MED ORDER — CEFAZOLIN IN SODIUM CHLORIDE 3-0.9 GM/100ML-% IV SOLN
INTRAVENOUS | Status: AC
  Filled 2020-05-20: qty 100

## 2020-05-20 MED ORDER — OXYTOCIN-SODIUM CHLORIDE 30-0.9 UT/500ML-% IV SOLN: 2.5000 [IU]/h | INTRAVENOUS | Status: AC

## 2020-05-20 MED ORDER — SODIUM CHLORIDE 0.9 % IV SOLN
INTRAVENOUS | Status: DC | PRN
  Administered 2020-05-20: 500 mg via INTRAVENOUS

## 2020-05-20 MED ORDER — DIPHENHYDRAMINE HCL 25 MG PO CAPS: 25.0000 mg | ORAL_CAPSULE | ORAL | Status: DC | PRN

## 2020-05-20 MED ORDER — ONDANSETRON HCL 4 MG/2ML IJ SOLN: 4.0000 mg | Freq: Three times a day (TID) | INTRAMUSCULAR | Status: DC | PRN

## 2020-05-20 MED ORDER — LEVONORGESTREL 20.1 MCG/DAY IU IUD
INTRAUTERINE_SYSTEM | INTRAUTERINE | Status: AC
  Filled 2020-05-20: qty 1

## 2020-05-20 MED ORDER — KETOROLAC TROMETHAMINE 30 MG/ML IJ SOLN
INTRAMUSCULAR | Status: AC
  Filled 2020-05-20: qty 1

## 2020-05-20 MED ORDER — MORPHINE SULFATE (PF) 0.5 MG/ML IJ SOLN
INTRAMUSCULAR | Status: DC | PRN
  Administered 2020-05-20: 3 ug via EPIDURAL

## 2020-05-20 SURGICAL SUPPLY — 40 items
BENZOIN TINCTURE PRP APPL 2/3 (GAUZE/BANDAGES/DRESSINGS) ×2 IMPLANT
CANISTER SUCT 3000ML PPV (MISCELLANEOUS) ×2 IMPLANT
CANISTER WOUND CARE 500ML ATS (WOUND CARE) ×2 IMPLANT
CHLORAPREP W/TINT 26ML (MISCELLANEOUS) ×2 IMPLANT
DRESSING PREVENA PLUS CUSTOM (GAUZE/BANDAGES/DRESSINGS) ×1 IMPLANT
DRSG OPSITE POSTOP 4X10 (GAUZE/BANDAGES/DRESSINGS) ×2 IMPLANT
DRSG PREVENA PLUS CUSTOM (GAUZE/BANDAGES/DRESSINGS) ×2
ELECT REM PT RETURN 9FT ADLT (ELECTROSURGICAL) ×2
ELECTRODE REM PT RTRN 9FT ADLT (ELECTROSURGICAL) ×1 IMPLANT
EXTRACTOR VACUUM KIWI (MISCELLANEOUS) ×2 IMPLANT
GLOVE BIOGEL PI IND STRL 7.0 (GLOVE) ×2 IMPLANT
GLOVE BIOGEL PI IND STRL 7.5 (GLOVE) ×1 IMPLANT
GLOVE BIOGEL PI INDICATOR 7.0 (GLOVE) ×2
GLOVE BIOGEL PI INDICATOR 7.5 (GLOVE) ×1
GLOVE SKINSENSE NS SZ7.0 (GLOVE) ×1
GLOVE SKINSENSE STRL SZ7.0 (GLOVE) ×1 IMPLANT
GOWN STRL REUS W/ TWL LRG LVL3 (GOWN DISPOSABLE) ×2 IMPLANT
GOWN STRL REUS W/ TWL XL LVL3 (GOWN DISPOSABLE) ×1 IMPLANT
GOWN STRL REUS W/TWL LRG LVL3 (GOWN DISPOSABLE) ×2
GOWN STRL REUS W/TWL XL LVL3 (GOWN DISPOSABLE) ×1
MAT PREVALON FULL STRYKER (MISCELLANEOUS) ×2 IMPLANT
NS IRRIG 1000ML POUR BTL (IV SOLUTION) ×2 IMPLANT
PACK C SECTION WH (CUSTOM PROCEDURE TRAY) ×2 IMPLANT
PAD ABD 7.5X8 STRL (GAUZE/BANDAGES/DRESSINGS) ×2 IMPLANT
PAD OB MATERNITY 4.3X12.25 (PERSONAL CARE ITEMS) ×2 IMPLANT
PAD PREP 24X48 CUFFED NSTRL (MISCELLANEOUS) ×2 IMPLANT
PENCIL SMOKE EVAC W/HOLSTER (ELECTROSURGICAL) ×2 IMPLANT
RETRACTOR TRAXI PANNICULUS (MISCELLANEOUS) ×1 IMPLANT
STRIP CLOSURE SKIN 1/2X4 (GAUZE/BANDAGES/DRESSINGS) ×2 IMPLANT
SUT MNCRL 0 VIOLET CTX 36 (SUTURE) ×2 IMPLANT
SUT MON AB 4-0 PS1 27 (SUTURE) ×2 IMPLANT
SUT MONOCRYL 0 CTX 36 (SUTURE) ×2
SUT PLAIN 2 0 XLH (SUTURE) ×2 IMPLANT
SUT VIC AB 0 CT1 36 (SUTURE) ×4 IMPLANT
SUT VIC AB 3-0 CT1 27 (SUTURE) ×2
SUT VIC AB 3-0 CT1 TAPERPNT 27 (SUTURE) ×2 IMPLANT
SUT VIC AB 4-0 KS 27 (SUTURE) ×2 IMPLANT
TOWEL OR 17X24 6PK STRL BLUE (TOWEL DISPOSABLE) ×4 IMPLANT
TRAXI PANNICULUS RETRACTOR (MISCELLANEOUS) ×1
WATER STERILE IRR 1000ML POUR (IV SOLUTION) ×2 IMPLANT

## 2020-05-20 NOTE — Progress Notes (Signed)
OB Note Late entry for 0230  I was called to patient's room for fetal bradycardia. When I arrived, the FHR was in the 70s and had been for approximately 6-7 minutes; patient already repositioned, IVF bolus injusing and terbutaline given. SVE just done by Dr. Myriam Jacobson and cervix unchanged at 7-8cm. Given this, I recommended going to the OR for a stat c-section, which patient was amenable to.  In the OR, FHR was in the 120s. I d/w mom to proceed with c-section or continue with tolac. I told her I recommended proceeding with rpt c-section given lack of cervical change since 1830 and especially since AROM and pitocin since 2330; she was amenable with proceeding with c-section. See Op note for details   Cornelia Copa MD Attending [facility] [employer] (Faculty Practice) 05/20/2020

## 2020-05-20 NOTE — Anesthesia Postprocedure Evaluation (Signed)
Anesthesia Post Note  Patient: Alexiss Iturralde  Procedure(s) Performed: CESAREAN SECTION (N/A )     Patient location during evaluation: PACU Anesthesia Type: Epidural Level of consciousness: oriented and awake and alert Pain management: pain level controlled Vital Signs Assessment: post-procedure vital signs reviewed and stable Respiratory status: spontaneous breathing, respiratory function stable and nonlabored ventilation Cardiovascular status: blood pressure returned to baseline and stable Postop Assessment: no headache, no backache, no apparent nausea or vomiting and epidural receding Anesthetic complications: no   No complications documented.  Last Vitals:  Vitals:   05/20/20 0445 05/20/20 0500  BP: 127/75 119/78  Pulse: 84 81  Resp: (!) 26 (!) 22  Temp:    SpO2: 95% 95%    Last Pain:  Vitals:   05/20/20 0500  TempSrc:   PainSc: 4    Pain Goal:                   Lucretia Kern

## 2020-05-20 NOTE — Discharge Summary (Signed)
Postpartum Discharge Summary    Patient Name: Mary Davies DOB: [birth year] MRN: [id number]  Date of admission: 05/19/2020 Delivery date:05/20/2020  Delivering provider: Aletha Halim  Date of discharge: 05/22/2020  Admitting diagnosis: Post-dates pregnancy [O48.0] Intrauterine pregnancy: [redacted]w[redacted]d    Secondary diagnosis:  Active Problems:   Former smoker   Asthma   Rh negative status during pregnancy   Previous cesarean delivery affecting pregnancy   Obesity affecting pregnancy   Abnormal Pap smear of cervix   History of gestational diabetes in prior pregnancy, currently pregnant   Positive urine drug screen   Post-dates pregnancy   Cesarean delivery delivered  Additional problems: as noted above    Discharge diagnosis: Term Pregnancy Delivered                                              Post partum procedures:none Augmentation: AROM and Pitocin Complications: None   Hospital course: Induction of Labor With Cesarean Section   30y.o. yo G3P1011 at 436w0das admitted to the hospital 05/19/2020 for induction of labor. Patient had a labor course significant for being admitted in early labor and progressing to 6cm. Her contractions spaced out and she was AROM'd and started on pitocin, however did not progress past 7 cm. The patient went for cesarean section due to Arrest of Dilation and Non-Reassuring FHR. Delivery details are as follows: Membrane Rupture Time/Date: 9:38 PM ,05/19/2020   Delivery Method:C-Section, Low Transverse  Details of operation can be found in separate operative Note.  Patient had an uncomplicated postpartum course. She is ambulating, tolerating a regular diet, passing flatus, and urinating well.  Patient is discharged home in stable condition on 05/22/20. Pt received po lasix 4072mnce on day of discharge given bilateral LE edema. She was discharged on norvasc 5mg64mily with plan for 1 week blood pressure check in clinic.  Newborn Data: Birth date:05/20/2020   Birth time:2:56 AM  Gender:Female  Living status:Living  Apgars:8 ,9  Weight:3405 g                                 Magnesium Sulfate received: No BMZ received: No Rhophylac:N/A MMR:N/A T-DaP:declined Flu: No Transfusion:No  Physical exam  Vitals:   05/21/20 0500 05/21/20 1507 05/22/20 0120 05/22/20 0530  BP: 127/77 130/81 132/89 122/86  Pulse: 82 95 73 94  Resp: 18 18 18 17   Temp: 98.6 F (37 C) 98 F (36.7 C) 98.2 F (36.8 C) 97.8 F (36.6 C)  TempSrc: Oral Oral Oral Oral  SpO2: 96% 98% 98% 100%   General: alert, cooperative and no distress Lochia: appropriate Uterine Fundus: firm Incision: Healing well with no significant drainage, No significant erythema, Dressing is clean, dry, and intact DVT Evaluation: No evidence of DVT seen on physical exam. 1-2+ pitting LE edema bilaterally. No cords or calf tenderness. No significant calf/ankle edema. Labs: Lab Results  Component Value Date   WBC 8.5 05/22/2020   HGB 11.1 (L) 05/22/2020   HCT 34.0 (L) 05/22/2020   MCV 101.5 (H) 05/22/2020   PLT 213 05/22/2020   CMP Latest Ref Rng & Units 05/20/2020  Glucose 70 - 99 mg/dL -  BUN 6 - 20 mg/dL -  Creatinine 0.44 - 1.00 mg/dL 0.64  Sodium 135 - 145 mmol/L -  Potassium 3.5 -  5.1 mmol/L -  Chloride 98 - 111 mmol/L -  CO2 22 - 32 mmol/L -  Calcium 8.9 - 10.3 mg/dL -  Total Protein 6.5 - 8.1 g/dL -  Total Bilirubin 0.3 - 1.2 mg/dL -  Alkaline Phos 38 - 126 U/L -  AST 15 - 41 U/L -  ALT 0 - 44 U/L -   Edinburgh Score: Edinburgh Postnatal Depression Scale Screening Tool 07/07/2017  I have been able to laugh and see the funny side of things. 0  I have looked forward with enjoyment to things. 0  I have blamed myself unnecessarily when things went wrong. 0  I have been anxious or worried for no good reason. 0  I have felt scared or panicky for no good reason. 0  Things have been getting on top of me. 0  I have been so unhappy that I have had difficulty sleeping. 0  I  have felt sad or miserable. 0  I have been so unhappy that I have been crying. 0  The thought of harming myself has occurred to me. 0  Edinburgh Postnatal Depression Scale Total 0     After visit meds:  Allergies as of 05/22/2020      Reactions   Pineapple Shortness Of Breath, Itching, Swelling      Medication List    TAKE these medications   acetaminophen 325 MG tablet Commonly known as: TYLENOL Take 650 mg by mouth every 6 (six) hours as needed.   amLODipine 5 MG tablet Commonly known as: NORVASC Take 1 tablet (5 mg total) by mouth daily.   coconut oil Oil Apply 1 application topically as needed (nipple pain).   ibuprofen 600 MG tablet Commonly known as: ADVIL Take 1 tablet (600 mg total) by mouth every 6 (six) hours.   oxyCODONE 5 MG immediate release tablet Commonly known as: Oxy IR/ROXICODONE Take 1-2 tablets (5-10 mg total) by mouth every 6 (six) hours as needed for severe pain or breakthrough pain.   Prenatal Gummies 0.18-25 MG Chew Chew by mouth.       Discharge home in stable condition Infant Feeding: bottle Infant Disposition:home with mother Discharge instruction: per After Visit Summary and Postpartum booklet. Activity: Advance as tolerated. Pelvic rest for 6 weeks.  Diet: routine diet Future Appointments:No future appointments. Follow up Visit:  Please schedule this patient for a In person postpartum visit in 4 weeks with the following provider: Any provider. Additional Postpartum F/U:Incision check 1 week and pap at postpartum visit, 1 week BP check High risk pregnancy complicated by: failed TOLAC, +UDS for cocaine, thc (CPS report per SW) Delivery mode:  C-Section, Low Transverse  Anticipated Birth Control:  IUD at outpatient visit   Mary Davies, Mary Cranker, MD OB Fellow, Faculty Practice 05/22/2020 8:28 AM

## 2020-05-20 NOTE — Plan of Care (Signed)
  Problem: Education: Goal: Knowledge of condition will improve Outcome: Completed/Met

## 2020-05-20 NOTE — Clinical Social Work Maternal (Signed)
CLINICAL SOCIAL WORK MATERNAL/CHILD NOTE  Patient Details  Name: Mary Davies MRN: [id number] Date of Birth: 09-Nov-1990  Date:  05/20/2020  Clinical Social Worker Initiating Note:  Darcus Austin, MSW, LCSWA Date/Time: Initiated:  05/20/20/1203     Child's Name:  Gretta Arab   Biological Parents:  Mother,Father Harrell Gave [place], 03/23/1986, [phone].)   Need for Interpreter:  None   Reason for Referral:  Current Substance Use/Substance Use During Pregnancy    Address:  [address] [facility] [place] [address]    Phone number:  [phone] (home)     Additional phone number:   Household Members/Support Persons (HM/SP):   Household Member/Support Person 1,Household Member/Support Person 2   HM/SP Name Relationship DOB or Age  HM/SP -1 Garnette Scheuermann FOB 03/23/1986  HM/SP -2 Donia Ast Daughter 05/08/2017  HM/SP -3        HM/SP -4        HM/SP -5        HM/SP -6        HM/SP -7        HM/SP -8          Natural Supports (not living in the home):  Spouse/significant other,Friends   Professional Supports:     Employment: Part-time   Type of Work: [employer]   Education:   (MOB reported, 11th grade.)   Homebound arranged:    Museum/gallery curator Resources:  Psychologist, counselling (MOB reported, [facility] and Nurse, mental health.)   Other Resources:  Physicist, medical ,[employer]   Cultural/Religious Considerations Which May Impact Care:   Strengths:  Ability to meet basic needs ,Pediatrician chosen   Psychotropic Medications:         Pediatrician:    [place] area  Pediatrician List:   [facility]      Pediatrician Fax Number:    Risk Factors/Current Problems:  Substance Use    Cognitive State:  Alert ,Able to Concentrate ,Goal Oriented ,Linear Thinking    Mood/Affect:  Comfortable ,Interested ,Relaxed ,Calm ,Happy    CSW Assessment: CSW  met with MOB to complete assessment for substance use during pregnancy. CSW observed resting in bed while holding infant and FOB by bedside.  MOB gave CSW verbal consent to complete assessment while FOB was present. CSW explained role and reason for consult. MOB was pleasant, polite and engaged with CSW. MOB reported, her reason for [facility] use was for pain, appetite, and sleep. MOB reported, her last use of THC was a couple days ago. CSW asked MOB about any other substance use. MOB reported, around Oct 2021 she had a positive UDS for cocaine. MOB reported, only THC use and assume someone laced her THC with cocaine.   CSW informed MOB of Drug Screen Policy and MOB was understanding of protocol. CSW made Craig report via Milus Glazier [phone]. CSW will continue to follow the CDS and will notify CPS if any additional information is warrented.  MOB denied any CPS involvement.   CSW provided education regarding the baby blues period vs. perinatal mood disorders, discussed treatment and gave resources for mental health follow up if concerns arise. CSW recommends self- evaluation during the postpartum time period using the New Mom Checklist from Postpartum Progress and encouraged MOB to contact a medical professional if symptoms are noted at any time.   When CSW asked MOB about her emotion  since delivery. MOB reported, she is feeling good. MOB identified, FOB and friends as her supports. MOB denied SI, and HI when CSW assessed for safety.   MOB reported, she receive WIC/FS. CSW informed MOB about adding infant to WIC/FS. MOB reported, infant's pediatrician will [facility] at [facility] and identified transportation as a  barrier to follow up care. CSW provided MOB with transportation resources. MOB reported, she has all essentials needed to care for infant. MOB reported, infant has a use car seat, bassinet and pack & play. MOB denied any additional barriers.     CSW provided education on  Sudden Infant Death Syndrome (SIDS).    CSW will continue to follow the CDS and will notify CPS if any additional information is warrented.   CSW Plan/Description:  CSW Will Continue to Monitor Umbilical Cord Tissue Drug Screen Results and Make Report if Warranted,Child Protective Service Report ,Sudden Infant Death Syndrome (SIDS) Education,Perinatal Mood and Anxiety Disorder (PMADs) Education,No Further Intervention Required/No Barriers to Discharge    Darcus Austin, [place] 05/20/2020, 12:15 PM

## 2020-05-20 NOTE — Transfer of Care (Signed)
Immediate Anesthesia Transfer of Care Note  Patient: Mary Davies  Procedure(s) Performed: CESAREAN SECTION (N/A )  Patient Location: PACU  Anesthesia Type:Epidural  Level of Consciousness: awake, alert  and oriented  Airway & Oxygen Therapy: Patient Spontanous Breathing and Patient connected to nasal cannula oxygen  Post-op Assessment: Report given to RN and Post -op Vital signs reviewed and stable  Post vital signs: Reviewed and stable  Last Vitals:  Vitals Value Taken Time  BP 117/66   Temp    Pulse 83   Resp 18   SpO2 91%    Pt on 2L [place],  Last Pain:  Vitals:   05/20/20 0200  TempSrc: Oral  PainSc:          Complications: No complications documented.

## 2020-05-20 NOTE — Progress Notes (Addendum)
Labor Progress Note Mary Davies is a 30 y.o. G3P1011 at [redacted]w[redacted]d presented for IOL for post dates, TOLAC.  S: Feeling a little more pressure intermittently   O:  BP 137/76   Pulse 83   Temp 98.4 F (36.9 C) (Oral)   Resp 18   LMP 07/15/2019   SpO2 98%  EFM: 130/Moderate Variability/ +Accels/ occasional early decel  CVE: Dilation: 7 Effacement (%): 70 Station: -1 Presentation: Vertex Exam by:: B McClam, RN   A&P: 30 y.o. [id number] [redacted]w[redacted]d presented for IOL for post dates, TOLAC.  #Labor/TOLAC: Progressing well. AROM performed and IUPC placed at 2230. Noted to have inadequate contractions and pitocin started at 2300. Has not made cervical change but has come down in station. Contractions appear to be adequate at this time. Will plan to recheck in 2 hours. Dr. Vergie Living updated on patient status.   #Pain: Epidural in place #FWB: Cat 1 #GBS negative  Gita Kudo, MD 2:16 AM

## 2020-05-20 NOTE — Op Note (Addendum)
Operative Note   SURGERY DATE: 05/20/2020  PRE-OP DIAGNOSIS:  *Pregnancy at [redacted]w[redacted]d *Failed TOLAC *Arrest of Dilation at 7-8cm  POST-OP DIAGNOSIS: Same. Delivered  PROCEDURE: Repeat low transverse cesarean section via pfannenstiel skin incision with double layer uterine closure  SURGEON: Surgeon(s) and Role:    * [employer], MD - Primary    * Marsala, Arlana Pouch, MD - Fellow  ANESTHESIA: epidural  ESTIMATED BLOOD LOSS: 300 mL  DRAINS: UOP via indwelling foley  TOTAL IV FLUIDS: 1800 mL crystalloid  VTE PROPHYLAXIS: SCDs to bilateral lower extremities  ANTIBIOTICS: Three grams ancef, 500mg  azithromycin, within 1 hour of skin incision  SPECIMENS: placenta to L&D  COMPLICATIONS: none  INDICATIONS: Patient initially taken to OR as a stat cesarean section for fetal bradycardia, however on evaluation in OR, fetal heart rate had recovered. Patient had also been 7 cm for > 4 hours, had not yet achieved adequate contractions despite AROM and pitocin. Proceeded with cesarean section for arrest of dilation after discussing with patient in OR.   FINDINGS: Scarring of adipose tissue. No intra-abdominal adhesions were noted. Uterus deviated to left side, grossly normal tubes and ovaries. Clear amniotic fluid, cephalic female infant, weight pending, APGARs 8/9, intact placenta.  PROCEDURE IN DETAIL: The patient was taken to the operating room where anesthesia was administered and normal fetal heart tones were confirmed. She was then prepped and draped in the normal fashion in the dorsal supine position with a leftward tilt.  After a time out was performed, a pfannensteil  skin incision was made with the scalpel and carried through to the underlying layer of fascia. The fascia was then incised at the midline and this incision was extended laterally with the mayo scissors. Attention was turned to the superior aspect of the fascial incision which was grasped with the kocher clamps x 2, tented  up and the rectus muscles were dissected off with the scalpel. In a similar fashion the inferior aspect of the fascial incision was grasped with the kocher clamps, tented up and the rectus muscles dissected off with the mayo scissors. The rectus muscles were then separated in the midline and the peritoneum was entered bluntly. The Alexis retractor was inserted. The bladder blade was inserted and the vesicouterine peritoneum was identified, tented up and entered with the metzenbaum scissors. This incision was extended laterally and the bladder flap was created digitally. The bladder blade was reinserted.  A low transverse hysterotomy was made with the scalpel until the endometrial cavity was breached and the amniotic sac ruptured, yielding clear amniotic fluid. This incision was extended bluntly and the infant's head, shoulders and body were delivered atraumatically.The cord was clamped x 2 and cut, and the infant was handed to the awaiting pediatricians, after delayed cord clamping was done.  The placenta was then gradually expressed from the uterus and then the uterus was cleared of all clots and debris. The hysterotomy was repaired with a running suture of 1-0 monocryl. A second imbricating layer of 1-0 monocryl suture was then placed.   The hysterotomy and all operative sites were reinspected and excellent hemostasis was noted after irrigation and suction of the abdomen with warm saline.  The peritoneum was closed with a running stitch of 3-0 Vicryl. A single figure of eight stitch was placed on area of bleeding rectus muscle. The fascia was reapproximated with 0 Vicryl in a simple running fashion bilaterally. The subcutaneous layer was then reapproximated with interrupted sutures of 2-0 plain gut, and the skin  was then closed with 4-0 vicryl, in a subcuticular fashion. A Prevena wound vac was placed on incision site.   The patient  tolerated the procedure well. Sponge, lap, needle, and instrument counts  were correct x 2. The patient was transferred to the recovery room awake, alert and breathing independently in stable condition.   Casper Harrison, MD [facility] Fellow, [facility] for [facility], [facility]   Agree with above. I was present and scrubbed for the entire procedure.   Cornelia Copa MD Attending Center for [employer] [facility])

## 2020-05-21 NOTE — Progress Notes (Signed)
POSTPARTUM PROGRESS NOTE   Subjective: Mary Davies is a 30 y.o. [id number] s/p rLTCS at [redacted]w[redacted]d for AOD, failed TOLAC. She reports she doing well. No acute events overnight. She denies any problems with ambulating, voiding or po intake. Denies nausea or vomiting. She has passed flatus but has not had a bowel movement yet. Pain is well controlled.  Lochia is mild.  Objective: Blood pressure 130/76, pulse 88, temperature 98.4 F (36.9 C), resp. rate 18, last menstrual period 07/15/2019, SpO2 96 %, unknown if currently breastfeeding.  Physical Exam:  General: alert, cooperative and no distress Chest: no respiratory distress Abdomen: soft, appropriately tender, edematous, prevena wound vac in place  Uterine Fundus: firm and at level of umbilicus Extremities: No calf swelling or tenderness  1+ edema  Recent Labs    05/19/20 1234 05/20/20 0708  HGB 13.4 13.3  HCT 40.7 39.7    Assessment/Plan: Mary Davies is a 30 y.o. [id number] s/p rLTCS at [redacted]w[redacted]d for arrest of dilation.  Routine Postpartum Care: Doing well, pain well-controlled.  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  Gita Kudo, MD OB Fellow, [facility] 05/21/2020 6:26 AM

## 2020-05-22 ENCOUNTER — Other Ambulatory Visit (HOSPITAL_COMMUNITY): Payer: Self-pay

## 2020-05-22 LAB — CBC
HCT: 34 % — ABNORMAL LOW (ref 36.0–46.0)
Hemoglobin: 11.1 g/dL — ABNORMAL LOW (ref 12.0–15.0)
MCH: 33.1 pg (ref 26.0–34.0)
MCHC: 32.6 g/dL (ref 30.0–36.0)
MCV: 101.5 fL — ABNORMAL HIGH (ref 80.0–100.0)
Platelets: 213 10*3/uL (ref 150–400)
RBC: 3.35 MIL/uL — ABNORMAL LOW (ref 3.87–5.11)
RDW: 15.7 % — ABNORMAL HIGH (ref 11.5–15.5)
WBC: 8.5 10*3/uL (ref 4.0–10.5)
nRBC: 0 % (ref 0.0–0.2)

## 2020-05-22 MED ORDER — COCONUT OIL OIL: 1.0000 "application " | TOPICAL_OIL | 0 refills | Status: DC | PRN

## 2020-05-22 MED ORDER — FUROSEMIDE 40 MG PO TABS
40.0000 mg | ORAL_TABLET | Freq: Once | ORAL | Status: AC
  Administered 2020-05-22: 40 mg via ORAL
  Filled 2020-05-22 (×2): qty 1

## 2020-05-22 MED ORDER — AMLODIPINE BESYLATE 5 MG PO TABS
5.0000 mg | ORAL_TABLET | Freq: Every day | ORAL | 0 refills | Status: DC
  Filled 2020-05-22: qty 30, 30d supply, fill #0

## 2020-05-22 MED ORDER — IBUPROFEN 600 MG PO TABS
600.0000 mg | ORAL_TABLET | Freq: Four times a day (QID) | ORAL | 0 refills | Status: DC
  Filled 2020-05-22: qty 30, 8d supply, fill #0

## 2020-05-22 MED ORDER — OXYCODONE HCL 5 MG PO TABS
5.0000 mg | ORAL_TABLET | Freq: Four times a day (QID) | ORAL | 0 refills | Status: DC | PRN
  Filled 2020-05-22: qty 15, 2d supply, fill #0

## 2020-05-22 NOTE — Discharge Instructions (Signed)

## 2020-05-22 NOTE — Social Work (Addendum)
Per CPS, MOB and infant have no barriers to discharge (72 Hr follow up). Physician updated.  MOB express concerns about [facility] appointment date and a week time before follow up. MOB was able to check her food stamps benefits and determine she has benefits. MOB reports she will use her food stamp benefits to purchase the infant's formula until the [facility] appointment.   No other needs identified. No barriers to discharge.   Addendum 12:45: MOB reports her support person was on the way to the hospital but forgot the car seat. MOB asked if she could purchase a car seat from the hospital. CSW informed MOB the car seats are limited and for mother's that do not have a car seat. MOB support person arrived to the hospital and agreeable to return home to get the infants car seat. MOB had concerns about the care seat being expired. MOB support person explain  the car seat was purchase new 3 years ago. MOB now waiting for her support person to arrive with the car seat.   Vivi Barrack, MSW, LCSW [facility]  Clinical Social Worker  [phone] 05/22/2020  12:04 PM

## 2020-05-29 ENCOUNTER — Other Ambulatory Visit: Payer: Self-pay

## 2020-05-29 ENCOUNTER — Ambulatory Visit: Payer: BC Managed Care – PPO

## 2020-05-29 VITALS — BP 133/86 | HR 74

## 2020-05-29 DIAGNOSIS — Z0130 Encounter for examination of blood pressure without abnormal findings: Secondary | ICD-10-CM

## 2020-05-29 DIAGNOSIS — Z5189 Encounter for other specified aftercare: Secondary | ICD-10-CM

## 2020-05-29 NOTE — Progress Notes (Signed)
Subjective:     Mary Davies is a 30 y.o. female who presents to the clinic 1 week  status post cesarean section delivery.She is eating a regular diet with-out difficulty. Bowel movements are normal. The patient some soreness at the incision site.    Review of Systems  Objective:    BP 133/86   Pulse 74  General:   well- appearance  Abdomen: "soft,bowel sounds active,non-tender  Incision:   {incision no dehiscence,incision well approximated,healing well, no drainage, no erythema,no hernia,no seroma,no swelling     Assessment:    Doing well and blood pressure is well controlled amlodipine.   Plan:    1. Continue any current medications. 2. Wound care discussed. 3. Activity restrictions: As tolerated  4. Anticipated return to work: N/A  5. Follow up:  Postpartum appointment in 3-4 weeks  Foster Sonnier Emeline Darling, CMA

## 2020-06-20 ENCOUNTER — Other Ambulatory Visit: Payer: Self-pay

## 2020-06-20 ENCOUNTER — Encounter: Payer: Self-pay | Admitting: Obstetrics and Gynecology

## 2020-06-20 ENCOUNTER — Ambulatory Visit (INDEPENDENT_AMBULATORY_CARE_PROVIDER_SITE_OTHER): Payer: BC Managed Care – PPO | Admitting: Obstetrics and Gynecology

## 2020-06-20 DIAGNOSIS — Z30430 Encounter for insertion of intrauterine contraceptive device: Secondary | ICD-10-CM | POA: Diagnosis not present

## 2020-06-20 DIAGNOSIS — R87612 Low grade squamous intraepithelial lesion on cytologic smear of cervix (LGSIL): Secondary | ICD-10-CM

## 2020-06-20 LAB — POCT URINE PREGNANCY: Preg Test, Ur: NEGATIVE

## 2020-06-20 MED ORDER — LEVONORGESTREL 20 MCG/DAY IU IUD
1.0000 | INTRAUTERINE_SYSTEM | Freq: Once | INTRAUTERINE | Status: AC
  Administered 2020-06-20: 1 via INTRAUTERINE

## 2020-06-20 MED ORDER — LEVONORGESTREL 20 MCG/DAY IU IUD: 1.0000 | INTRAUTERINE_SYSTEM | Freq: Once | INTRAUTERINE | Status: DC

## 2020-06-20 NOTE — Patient Instructions (Signed)
Health Maintenance, Female Adopting a healthy lifestyle and getting preventive care are important in promoting health and wellness. Ask your health care provider about:  The right schedule for you to have regular tests and exams.  Things you can do on your own to prevent diseases and keep yourself healthy. What should I know about diet, weight, and exercise? Eat a healthy diet  Eat a diet that includes plenty of vegetables, fruits, low-fat dairy products, and lean protein.  Do not eat a lot of foods that are high in solid fats, added sugars, or sodium.   Maintain a healthy weight Body mass index (BMI) is used to identify weight problems. It estimates body fat based on height and weight. Your health care provider can help determine your BMI and help you achieve or maintain a healthy weight. Get regular exercise Get regular exercise. This is one of the most important things you can do for your health. Most adults should:  Exercise for at least 150 minutes each week. The exercise should increase your heart rate and make you sweat (moderate-intensity exercise).  Do strengthening exercises at least twice a week. This is in addition to the moderate-intensity exercise.  Spend less time sitting. Even light physical activity can be beneficial. Watch cholesterol and blood lipids Have your blood tested for lipids and cholesterol at 32 years of age, then have this test every 5 years. Have your cholesterol levels checked more often if:  Your lipid or cholesterol levels are high.  You are older than 32 years of age.  You are at high risk for heart disease. What should I know about cancer screening? Depending on your health history and family history, you may need to have cancer screening at various ages. This may include screening for:  Breast cancer.  Cervical cancer.  Colorectal cancer.  Skin cancer.  Lung cancer. What should I know about heart disease, diabetes, and high blood  pressure? Blood pressure and heart disease  High blood pressure causes heart disease and increases the risk of stroke. This is more likely to develop in people who have high blood pressure readings, are of African descent, or are overweight.  Have your blood pressure checked: ? Every 3-5 years if you are 18-39 years of age. ? Every year if you are 40 years old or older. Diabetes Have regular diabetes screenings. This checks your fasting blood sugar level. Have the screening done:  Once every three years after age 40 if you are at a normal weight and have a low risk for diabetes.  More often and at a younger age if you are overweight or have a high risk for diabetes. What should I know about preventing infection? Hepatitis B If you have a higher risk for hepatitis B, you should be screened for this virus. Talk with your health care provider to find out if you are at risk for hepatitis B infection. Hepatitis C Testing is recommended for:  Everyone born from 1945 through 1965.  Anyone with known risk factors for hepatitis C. Sexually transmitted infections (STIs)  Get screened for STIs, including gonorrhea and chlamydia, if: ? You are sexually active and are younger than 32 years of age. ? You are older than 32 years of age and your health care provider tells you that you are at risk for this type of infection. ? Your sexual activity has changed since you were last screened, and you are at increased risk for chlamydia or gonorrhea. Ask your health care provider   if you are at risk.  Ask your health care provider about whether you are at high risk for HIV. Your health care provider may recommend a prescription medicine to help prevent HIV infection. If you choose to take medicine to prevent HIV, you should first get tested for HIV. You should then be tested every 3 months for as long as you are taking the medicine. Pregnancy  If you are about to stop having your period (premenopausal) and  you may become pregnant, seek counseling before you get pregnant.  Take 400 to 800 micrograms (mcg) of folic acid every day if you become pregnant.  Ask for birth control (contraception) if you want to prevent pregnancy. Osteoporosis and menopause Osteoporosis is a disease in which the bones lose minerals and strength with aging. This can result in bone fractures. If you are 65 years old or older, or if you are at risk for osteoporosis and fractures, ask your health care provider if you should:  Be screened for bone loss.  Take a calcium or vitamin D supplement to lower your risk of fractures.  Be given hormone replacement therapy (HRT) to treat symptoms of menopause. Follow these instructions at home: Lifestyle  Do not use any products that contain nicotine or tobacco, such as cigarettes, e-cigarettes, and chewing tobacco. If you need help quitting, ask your health care provider.  Do not use street drugs.  Do not share needles.  Ask your health care provider for help if you need support or information about quitting drugs. Alcohol use  Do not drink alcohol if: ? Your health care provider tells you not to drink. ? You are pregnant, may be pregnant, or are planning to become pregnant.  If you drink alcohol: ? Limit how much you use to 0-1 drink a day. ? Limit intake if you are breastfeeding.  Be aware of how much alcohol is in your drink. In the [place] one drink equals one 12 oz bottle of beer (355 mL), one 5 oz glass of wine (148 mL), or one 1 oz glass of hard liquor (44 mL). General instructions  Schedule regular health, dental, and eye exams.  Stay current with your vaccines.  Tell your health care provider if: ? You often feel depressed. ? You have ever been abused or do not feel safe at home. Summary  Adopting a healthy lifestyle and getting preventive care are important in promoting health and wellness.  Follow your health care provider's instructions about healthy  diet, exercising, and getting tested or screened for diseases.  Follow your health care provider's instructions on monitoring your cholesterol and blood pressure. This information is not intended to replace advice given to you by your health care provider. Make sure you discuss any questions you have with your health care provider. Document Revised: 12/24/2017 Document Reviewed: 12/24/2017 Elsevier Patient Education  2021 Elsevier Inc.  

## 2020-06-20 NOTE — Progress Notes (Signed)
Patient also needs a pap for LGSIL on 02/02/19

## 2020-06-20 NOTE — Progress Notes (Signed)
Post Partum Visit Note  Mary Davies is a 30 y.o. G1P2012 female who presents for a postpartum visit. She is 4 weeks postpartum following a repeat cesarean section.  I have fully reviewed the prenatal and intrapartum course. The delivery was at 40 gestational weeks.  Anesthesia: spinal. Postpartum course has been uncomplicated. Baby is doing well. Baby is feeding by bottle - Octavia Heir. Bleeding no bleeding. Bowel function is normal. Bladder function is normal. Patient is not sexually active. Contraception method is none. Postpartum depression screening: negative.   The pregnancy intention screening data noted above was reviewed. Potential methods of contraception were discussed. The patient elected to proceed with IUD or IUS.     Health Maintenance Due  Topic Date Due  . COVID-19 Vaccine (1) Never done  . Pneumococcal Vaccine 64-76 Years old (1 of 2 - PPSV23) Never done  . FOOT EXAM  Never done  . OPHTHALMOLOGY EXAM  Never done  . URINE MICROALBUMIN  Never done  . HEMOGLOBIN A1C  05/17/2020    Medical record reviewed  Review of Systems Pertinent items are noted in HPI.  Objective:  Ht 5\' 2"  (1.575 m)   Wt 218 lb 14.4 oz (99.3 kg)   BMI 40.04 kg/m    General:  alert   Breasts:  not indicated  Lungs: clear to auscultation bilaterally  Heart:  regular rate and rhythm, S1, S2 normal, no murmur, click, rub or gallop  Abdomen: soft, non-tender; bowel sounds normal; no masses,  no organomegaly   Wound well approximated incision  GU exam:  normal, pap smear obtained           IUD Insertion Procedure Note Patient identified, informed consent performed, consent signed.   Discussed risks of irregular bleeding, cramping, infection, malpositioning or misplacement of the IUD outside the uterus which may require further procedure such as laparoscopy. Time out was performed.  Urine pregnancy test negative.  Speculum placed in the vagina.  Cervix visualized.  Cleaned with  Betadine x 2.  Grasped anteriorly with a single tooth tenaculum.  Uterus sounded to 8 cm.  Mirena IUD placed per manufacturer's recommendations.  Strings trimmed to 3 cm. Tenaculum was removed, good hemostasis noted.  Patient tolerated procedure well.   Patient was given post-procedure instructions.  She was advised to have backup contraception for one week.  Patient was also asked to check IUD strings periodically and follow up in 4 weeks for IUD check.     Assessment:    There are no diagnoses linked to this encounter.  Nl postpartum exam. Abnormal pap smear IUD insertion HTN  Plan:   Essential components of care per ACOG recommendations:  1.  Mood and well being: Patient with negative depression screening today. Reviewed local resources for support.  - Patient tobacco use? No.   - hx of drug use? No.    2. Infant care and feeding:  -Patient currently breastmilk feeding? No.  -Social determinants of health (SDOH) reviewed in EPIC. No concerns  3. Sexuality, contraception and birth spacing - Patient does not want a pregnancy in the next year.  Desired family size is uncertain - Reviewed forms of contraception in tiered fashion. Patient desired IUD today.   - Discussed birth spacing of 18 months  4. Sleep and fatigue -Encouraged family/partner/community support of 4 hrs of uninterrupted sleep to help with mood and fatigue  5. Physical Recovery  - Discussed patients delivery and complications. She describes her labor as good. - Patient  had a C-section failure to progress. Patient had a c section.  - Patient has urinary incontinence? No. - Patient is safe to resume physical and sexual activity  6.  Health Maintenance - HM due items addressed Yes - Last pap smear  Diagnosis  Date Value Ref Range Status  02/02/2019 - Low grade squamous intraepithelial lesion (LSIL) (A)  Final   Pap smear done at today's visit.  -Breast Cancer screening indicated? No.   7. Chronic  Disease/Pregnancy Condition follow up: None  - PCP follow up  Nettie Elm, MD Center for [facility], [facility]

## 2020-06-23 LAB — CYTOLOGY - PAP
Adequacy: ABSENT
Comment: NEGATIVE
Diagnosis: UNDETERMINED — AB
High risk HPV: NEGATIVE

## 2020-07-27 ENCOUNTER — Ambulatory Visit: Payer: BC Managed Care – PPO | Admitting: Obstetrics and Gynecology

## 2020-08-08 ENCOUNTER — Emergency Department (HOSPITAL_COMMUNITY)
Admission: EM | Admit: 2020-08-08 | Discharge: 2020-08-08 | Disposition: A | Discharge status: Home / Self Care | Payer: BC Managed Care – PPO | Attending: Emergency Medicine | Admitting: Emergency Medicine

## 2020-08-08 ENCOUNTER — Other Ambulatory Visit: Payer: Self-pay

## 2020-08-08 DIAGNOSIS — Z5321 Procedure and treatment not carried out due to patient leaving prior to being seen by health care provider: Secondary | ICD-10-CM | POA: Diagnosis not present

## 2020-08-08 DIAGNOSIS — M25532 Pain in left wrist: Secondary | ICD-10-CM | POA: Diagnosis not present

## 2020-08-08 DIAGNOSIS — M545 Low back pain, unspecified: Secondary | ICD-10-CM | POA: Insufficient documentation

## 2020-08-08 DIAGNOSIS — M25531 Pain in right wrist: Secondary | ICD-10-CM | POA: Diagnosis not present

## 2020-08-08 NOTE — ED Provider Notes (Signed)
Emergency Medicine Provider Triage Evaluation Note  Mary Davies , a 30 y.o. female  was evaluated in triage.  Pt complains of wrist pain and back pain. Bilateral wrist x weeks. Works at [employer]. Intermittent knot in left lower back x 2 months.   Review of Systems  Positive: Joint pain Negative: fever  Physical Exam  BP 120/79   Pulse 77   Temp 98.2 F (36.8 C) (Oral)   Resp 16   SpO2 96%  Gen:   Awake, no distress   Resp:  Normal effort  MSK:   + [place] bilaterally Other:  + back pain  Medical Decision Making  Medically screening exam initiated at 9:17 AM.  Appropriate orders placed.  Mina Babula was informed that the remainder of the evaluation will be completed by another provider, this initial triage assessment does not replace that evaluation, and the importance of remaining in the ED until their evaluation is complete.     Renne Crigler, PA-C 08/08/20 [id number]    Cathren Laine, MD 08/10/20 1310

## 2020-08-08 NOTE — ED Triage Notes (Signed)
Pt reports several months of L wrist and lower back pain. Loads tractor trailer trucks for work. Has been seen for back pain and has been told it is a muscle strain.

## 2020-08-08 NOTE — ED Notes (Signed)
Called patient for vitla update patient didbnt answer

## 2020-08-19 ENCOUNTER — Other Ambulatory Visit: Payer: Self-pay

## 2020-08-19 ENCOUNTER — Encounter: Payer: Self-pay | Admitting: *Deleted

## 2020-08-19 ENCOUNTER — Ambulatory Visit
Admission: EM | Admit: 2020-08-19 | Discharge: 2020-08-19 | Disposition: A | Discharge status: Home / Self Care | Payer: BC Managed Care – PPO | Attending: Urgent Care | Admitting: Urgent Care

## 2020-08-19 DIAGNOSIS — M659 Synovitis and tenosynovitis, unspecified: Secondary | ICD-10-CM

## 2020-08-19 DIAGNOSIS — M79604 Pain in right leg: Secondary | ICD-10-CM

## 2020-08-19 DIAGNOSIS — M545 Low back pain, unspecified: Secondary | ICD-10-CM

## 2020-08-19 MED ORDER — NAPROXEN 500 MG PO TABS: 500.0000 mg | ORAL_TABLET | Freq: Two times a day (BID) | ORAL | 0 refills | Status: AC

## 2020-08-19 MED ORDER — TIZANIDINE HCL 4 MG PO TABS: 4.0000 mg | ORAL_TABLET | Freq: Four times a day (QID) | ORAL | 0 refills | Status: AC | PRN

## 2020-08-19 NOTE — ED Triage Notes (Signed)
Pt reports bil wrist pain and lower back pain that radiates to RT leg.

## 2020-08-19 NOTE — ED Provider Notes (Signed)
[facility]   MRN: [id number] DOB: 11-08-1990  Subjective:   Mary Davies is a 30 y.o. female presenting for several week history of persistent intermittent bilateral wrist pain, several month history of right-sided low back pain that radiates into the thigh.  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  She did not have any particular trauma or inciting event that led to her back pain or wrist pain.  Has not used medications for relief.  Regarding her back, states that its been going on for a while, the pain starts at the right upper side of her hip and radiates down toward the back of her thigh.  No weakness, numbness or tingling, rashes.  Has not seen a specialist for this.  No current facility-administered medications for this encounter.  Current Outpatient Medications:    acetaminophen (TYLENOL) 325 MG tablet, Take 650 mg by mouth every 6 (six) hours as needed., Disp: , Rfl:    ibuprofen (ADVIL) 600 MG tablet, Take 1 tablet (600 mg total) by mouth every 6 (six) hours., Disp: 30 tablet, Rfl: 0   Allergies  Allergen Reactions   Pineapple Shortness Of Breath, Itching and Swelling    Past Medical History:  Diagnosis Date   Asthma    prn inhaler   Closed left radial fracture 01/15/2011   Depression    hx of, fine now   Fracture of metatarsal bone of left foot 04/22/2015   5th metatarsal   Left breast abscess 04/2015     Past Surgical History:  Procedure Laterality Date   CESAREAN SECTION N/A 05/08/2017   Procedure: CESAREAN SECTION;  Surgeon: Tilda Burrow, MD;  Location: [facility] BIRTHING SUITES;  Service: Obstetrics;  Laterality: N/A;   CESAREAN SECTION N/A 05/20/2020   Procedure: CESAREAN SECTION;  Surgeon: [place] Bing, MD;  Location: [facility] LD ORS;  Service: Obstetrics;  Laterality: N/A;  STAT changed to Urgent   INCISION AND DRAINAGE ABSCESS Left 09/24/2012   Procedure: INCISION AND DRAINAGE BREAST  ABSCESS;  Surgeon: Robyne Askew, MD;  Location: [facility] OR;  Service: General;  Laterality: Left;   INCISION AND DRAINAGE ABSCESS Left 05/08/2015   Procedure: INCISION AND DRAINAGE LEFT BREAST  ABSCESS;  Surgeon: Chevis Pretty III, MD;  Location: [facility];  Service: General;  Laterality: Left;    Family History  Problem Relation Age of Onset   Diabetes Mother    Stroke Mother    Asthma Father     Social History   Tobacco Use   Smoking status: Former    Packs/day: 0.25    Years: 10.00    Pack years: 2.50    Types: Cigarettes    Quit date: 12/14/2019    Years since quitting: 0.6   Smokeless tobacco: Never   Tobacco comments:    trying to quit  Vaping Use   Vaping Use: Never used  Substance Use Topics   Alcohol use: No   Drug use: Yes    Types: Marijuana    ROS   Objective:   Vitals: BP 116/74   Pulse 71   Temp 98.2 F (36.8 C)   Resp 16   SpO2 94%   Physical Exam Constitutional:      General: She is not in acute distress.    Appearance: Normal appearance. She is well-developed. She is obese. She is not ill-appearing, toxic-appearing or diaphoretic.  HENT:     Head: Normocephalic  and atraumatic.     Right Ear: External ear normal.     Left Ear: External ear normal.     Nose: Nose normal.     Mouth/Throat:     Mouth: Mucous membranes are moist.     Pharynx: Oropharynx is clear.  Eyes:     General: No scleral icterus.       Right eye: No discharge.        Left eye: No discharge.     Extraocular Movements: Extraocular movements intact.     Conjunctiva/sclera: Conjunctivae normal.     Pupils: Pupils are equal, round, and reactive to light.  Cardiovascular:     Rate and Rhythm: Normal rate.  Pulmonary:     Effort: Pulmonary effort is normal.  Musculoskeletal:     Right wrist: Tenderness (mild over radial aspect of the wrist) present. No swelling, deformity, effusion, lacerations, bony tenderness, snuff box tenderness or crepitus. Normal range of  motion.     Left wrist: No swelling, deformity, effusion, lacerations, tenderness (None elicited on exam but patient does endorse that the same area that I elicited pain of the right wrist hurts her on the left), bony tenderness, snuff box tenderness or crepitus. Normal range of motion.     Lumbar back: No swelling, edema, deformity, signs of trauma, lacerations, spasms, tenderness or bony tenderness. Normal range of motion. Negative right straight leg raise test and negative left straight leg raise test. No scoliosis.       Back:     Comments: Strength 5/5 for lower extremities.  Full range of motion.  Skin:    General: Skin is warm and dry.  Neurological:     General: No focal deficit present.     Mental Status: She is alert and oriented to person, place, and time.     Motor: No weakness.     Coordination: Coordination normal.     Gait: Gait normal.     Deep Tendon Reflexes: Reflexes normal.  Psychiatric:        Mood and Affect: Mood normal.        Behavior: Behavior normal.        Thought Content: Thought content normal.        Judgment: Judgment normal.     Assessment and Plan :   PDMP not reviewed this encounter.  1. Tenosynovitis of both wrists   2. Low back pain radiating to right leg     Patient has tenosynovitis of both of her wrists.  Counseled that I suspect this is largely related to the nature of her work.  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  Recommended consultation with an orthopedist for more definitive management.  In the meantime use naproxen, tizanidine.  Regarding her back, describes sciatica but is not elicited on exam.  Recommended same treatment as above.  Follow-up with Ortho.  Imaging was deferred given excellent physical exam findings, no fall or trauma warranting this. Counseled patient on potential for adverse effects with medications prescribed/recommended today, ER and  return-to-clinic precautions discussed, patient verbalized understanding.    Wallis Bamberg, [place] 08/19/20 1035

## 2020-08-23 DIAGNOSIS — M25531 Pain in right wrist: Secondary | ICD-10-CM | POA: Diagnosis not present

## 2020-08-23 DIAGNOSIS — M25532 Pain in left wrist: Secondary | ICD-10-CM | POA: Diagnosis not present

## 2020-08-23 DIAGNOSIS — M545 Low back pain, unspecified: Secondary | ICD-10-CM | POA: Diagnosis not present

## 2020-10-13 DIAGNOSIS — M25531 Pain in right wrist: Secondary | ICD-10-CM | POA: Diagnosis not present

## 2020-10-13 DIAGNOSIS — M25532 Pain in left wrist: Secondary | ICD-10-CM | POA: Diagnosis not present

## 2020-10-13 DIAGNOSIS — M545 Low back pain, unspecified: Secondary | ICD-10-CM | POA: Diagnosis not present

## 2020-10-13 DIAGNOSIS — M654 Radial styloid tenosynovitis [de Quervain]: Secondary | ICD-10-CM | POA: Diagnosis not present

## 2020-10-28 DIAGNOSIS — M5416 Radiculopathy, lumbar region: Secondary | ICD-10-CM | POA: Diagnosis not present

## 2020-11-03 DIAGNOSIS — M545 Low back pain, unspecified: Secondary | ICD-10-CM | POA: Diagnosis not present

## 2020-11-03 DIAGNOSIS — M25532 Pain in left wrist: Secondary | ICD-10-CM | POA: Diagnosis not present

## 2020-11-03 DIAGNOSIS — M25531 Pain in right wrist: Secondary | ICD-10-CM | POA: Diagnosis not present

## 2020-11-21 DIAGNOSIS — M461 Sacroiliitis, not elsewhere classified: Secondary | ICD-10-CM | POA: Diagnosis not present

## 2021-03-02 ENCOUNTER — Other Ambulatory Visit: Payer: Self-pay

## 2021-03-02 ENCOUNTER — Encounter (HOSPITAL_COMMUNITY): Payer: Self-pay

## 2021-03-02 ENCOUNTER — Emergency Department (HOSPITAL_COMMUNITY)
Admission: EM | Admit: 2021-03-02 | Discharge: 2021-03-02 | Disposition: A | Discharge status: Home / Self Care | Payer: BC Managed Care – PPO | Attending: Emergency Medicine | Admitting: Emergency Medicine

## 2021-03-02 DIAGNOSIS — R0789 Other chest pain: Secondary | ICD-10-CM | POA: Diagnosis not present

## 2021-03-02 DIAGNOSIS — N644 Mastodynia: Secondary | ICD-10-CM | POA: Insufficient documentation

## 2021-03-02 LAB — CBC WITH DIFFERENTIAL/PLATELET
Abs Immature Granulocytes: 0.03 10*3/uL (ref 0.00–0.07)
Basophils Absolute: 0 10*3/uL (ref 0.0–0.1)
Basophils Relative: 0 %
Eosinophils Absolute: 0.2 10*3/uL (ref 0.0–0.5)
Eosinophils Relative: 2 %
HCT: 45.2 % (ref 36.0–46.0)
Hemoglobin: 15 g/dL (ref 12.0–15.0)
Immature Granulocytes: 0 %
Lymphocytes Relative: 32 %
Lymphs Abs: 3.1 10*3/uL (ref 0.7–4.0)
MCH: 32.9 pg (ref 26.0–34.0)
MCHC: 33.2 g/dL (ref 30.0–36.0)
MCV: 99.1 fL (ref 80.0–100.0)
Monocytes Absolute: 0.7 10*3/uL (ref 0.1–1.0)
Monocytes Relative: 7 %
Neutro Abs: 5.6 10*3/uL (ref 1.7–7.7)
Neutrophils Relative %: 59 %
Platelets: 298 10*3/uL (ref 150–400)
RBC: 4.56 MIL/uL (ref 3.87–5.11)
RDW: 13.1 % (ref 11.5–15.5)
WBC: 9.7 10*3/uL (ref 4.0–10.5)
nRBC: 0 % (ref 0.0–0.2)

## 2021-03-02 LAB — BASIC METABOLIC PANEL
Anion gap: 7 (ref 5–15)
BUN: 22 mg/dL — ABNORMAL HIGH (ref 6–20)
CO2: 25 mmol/L (ref 22–32)
Calcium: 9.6 mg/dL (ref 8.9–10.3)
Chloride: 106 mmol/L (ref 98–111)
Creatinine, Ser: 0.86 mg/dL (ref 0.44–1.00)
GFR, Estimated: >60 mL/min (ref >60)
Glucose, Bld: 81 mg/dL (ref 70–99)
Potassium: 4.2 mmol/L (ref 3.5–5.1)
Sodium: 138 mmol/L (ref 135–145)

## 2021-03-02 MED ORDER — CEPHALEXIN 500 MG PO CAPS: 500.0000 mg | ORAL_CAPSULE | Freq: Four times a day (QID) | ORAL | 0 refills | Status: DC

## 2021-03-02 NOTE — ED Triage Notes (Signed)
For a couple years, patient said she has had infection inside of her breast. She got her left one cut open. Now she had a knot cut open on her right one, she got it drained 2 years ago. She said there is still a knot in there about the size of a quarter. She said that it is swollen now, she thinks its clogged. It is very sore now.

## 2021-03-02 NOTE — ED Provider Notes (Signed)
[facility] DEPT Provider Note   CSN: [id number] Arrival date & time: 03/02/21  1959     History  Chief Complaint  Patient presents with   Breast Pain    Mary Davies is a 31 y.o. female who presents to the ED due to right breast pain.  Patient states she has had a nodule in her right breast for the past 2-1/2 years.  She has had bilateral nodules drained at the breast center and in the ED in the past.  Patient notes right breast nodule has become tender over the past few days.  No fever or chills.  No drainage from nipple.  She is not currently breast-feeding.  Patient does not have an OB/GYN currently.     Home Medications Prior to Admission medications   Medication Sig Start Date End Date Taking? Authorizing Provider  acetaminophen (TYLENOL) 325 MG tablet Take 650 mg by mouth every 6 (six) hours as needed.    [provider]  ibuprofen (ADVIL) 600 MG tablet Take 1 tablet (600 mg total) by mouth every 6 (six) hours. 05/22/20   Randa Ngo, MD  naproxen (NAPROSYN) 500 MG tablet Take 1 tablet (500 mg total) by mouth 2 (two) times daily with a meal. 08/19/20   Jaynee Eagles, PA-C  tiZANidine (ZANAFLEX) 4 MG tablet Take 1 tablet (4 mg total) by mouth every 6 (six) hours as needed for muscle spasms. 08/19/20   Jaynee Eagles, PA-C      Allergies    Pineapple    Review of Systems   Review of Systems  Constitutional:  Negative for chills and fever.  Cardiovascular:  Positive for chest pain (right breast pain).   Physical Exam Updated Vital Signs BP 130/78 (BP Location: Left Arm)    Pulse 74    Temp 97.9 F (36.6 C) (Oral)    Resp 17    Ht 5\' 2"  (1.575 m)    Wt 99 kg    SpO2 99%    BMI 39.92 kg/m  Physical Exam Vitals and nursing note reviewed.  Constitutional:      General: She is not in acute distress.    Appearance: She is not ill-appearing.  HENT:     Head: Normocephalic.  Eyes:     Pupils: Pupils are equal, round, and reactive to  light.  Cardiovascular:     Rate and Rhythm: Normal rate and regular rhythm.     Pulses: Normal pulses.     Heart sounds: Normal heart sounds. No murmur heard.   No friction rub. No gallop.  Pulmonary:     Effort: Pulmonary effort is normal.     Breath sounds: Normal breath sounds.  Chest:       Comments: Tenderness throughout right areola with induration. No drainage Abdominal:     General: Abdomen is flat. There is no distension.     Palpations: Abdomen is soft.     Tenderness: There is no abdominal tenderness. There is no guarding or rebound.  Musculoskeletal:        General: Normal range of motion.     Cervical back: Neck supple.  Skin:    General: Skin is warm and dry.  Neurological:     General: No focal deficit present.     Mental Status: She is alert.  Psychiatric:        Mood and Affect: Mood normal.        Behavior: Behavior normal.    ED Results / Procedures /  Treatments   Labs (all labs ordered are listed, but only abnormal results are displayed) Labs Reviewed  BASIC METABOLIC PANEL - Abnormal; Notable for the following components:      Result Value   BUN 22 (*)    All other components within normal limits  CBC WITH DIFFERENTIAL/PLATELET    EKG None  Radiology No results found.  Procedures Procedures    Medications Ordered in ED Medications - No data to display  ED Course/ Medical Decision Making/ A&P                           Medical Decision Making Amount and/or Complexity of Data Reviewed External Data Reviewed: notes. Labs: ordered. Decision-making details documented in ED Course.   31 year old female presents to the ED due to right breast pain.  Patient has a history of previous infections in bilateral breast requiring I&D.  No fever or chills.  She is not currently breast-feeding.  Vitals all within normal limits.  Patient in no acute distress.  Tenderness throughout right areola with some induration.  No drainage.  No fluctuance.   Routine labs ordered at triage.  CBC reassuring.  No leukocytosis and normal hemoglobin.  BMP reassuring.  No major electrolyte derangements.  Patient discharged with Keflex.  OBGYN referral given to patient at discharge.  Advised patient to call Monday to schedule an appointment for further evaluation.  No signs of systemic infection.  Advised patient to ask for referral from OB/GYN to breast center for further evaluation. Strict ED precautions discussed with patient. Patient states understanding and agrees to plan. Patient discharged home in no acute distress and stable vitals         Final Clinical Impression(s) / ED Diagnoses Final diagnoses:  Breast pain    Rx / DC Orders ED Discharge Orders     None         Karie Kirks 03/02/21 2233    Deno Etienne, DO 03/02/21 2240

## 2021-03-02 NOTE — ED Provider Triage Note (Signed)
Emergency Medicine Provider Triage Evaluation Note  Mary Davies , a 31 y.o. female  was evaluated in triage.  Pt complains of right breast pain. Patient notes she has had a nodule in her right breast for roughly 2.5 years. Area became tender over the past few days. No drainage from nipple. Patient is not currently breastfeeding. Denies fever and chills.   Review of Systems  Positive: Breast pain Negative: fever  Physical Exam  BP 130/78 (BP Location: Left Arm)    Pulse 74    Temp 97.9 F (36.6 C) (Oral)    Resp 17    Ht 5\' 2"  (1.575 m)    Wt 99 kg    SpO2 99%    BMI 39.92 kg/m  Gen:   Awake, no distress   Resp:  Normal effort  MSK:   Moves extremities without difficulty  Other:  Tenderness throughout right areola with induration  Medical Decision Making  Medically screening exam initiated at 8:36 PM.  Appropriate orders placed.  [name] was informed that the remainder of the evaluation will be completed by another provider, this initial triage assessment does not replace that evaluation, and the importance of remaining in the ED until their evaluation is complete.  Possible abscess vs. Nodule. Labs ordered   [name] 03/02/21 2039

## 2021-03-02 NOTE — Discharge Instructions (Addendum)
It was a pleasure taking care of you today. As discussed, I have included the number of the OBGYN. Call Monday to schedule an appointment. Continue to use warm compresses. You may take over the counter ibuprofen or tylenol as needed for pain. Return to the ER for new or worsening symptoms.   I am sending you home with keflex. Take as prescribed

## 2021-03-05 ENCOUNTER — Emergency Department (HOSPITAL_COMMUNITY)
Admission: EM | Admit: 2021-03-05 | Discharge: 2021-03-05 | Disposition: A | Discharge status: Home / Self Care | Payer: BC Managed Care – PPO | Attending: Emergency Medicine | Admitting: Emergency Medicine

## 2021-03-05 ENCOUNTER — Telehealth: Payer: Self-pay

## 2021-03-05 ENCOUNTER — Encounter (HOSPITAL_COMMUNITY): Payer: Self-pay

## 2021-03-05 DIAGNOSIS — N611 Abscess of the breast and nipple: Secondary | ICD-10-CM

## 2021-03-05 MED ORDER — HYDROCODONE-ACETAMINOPHEN 5-325 MG PO TABS: 1.0000 | ORAL_TABLET | Freq: Four times a day (QID) | ORAL | 0 refills | Status: AC | PRN

## 2021-03-05 MED ORDER — LIDOCAINE HCL 2 % IJ SOLN
10.0000 mL | Freq: Once | INTRAMUSCULAR | Status: AC
  Administered 2021-03-05: 200 mg via INTRADERMAL
  Filled 2021-03-05: qty 20

## 2021-03-05 MED ORDER — IBUPROFEN 600 MG PO TABS: 600.0000 mg | ORAL_TABLET | Freq: Four times a day (QID) | ORAL | 0 refills | Status: AC

## 2021-03-05 MED ORDER — IBUPROFEN 800 MG PO TABS
800.0000 mg | ORAL_TABLET | Freq: Once | ORAL | Status: AC
  Administered 2021-03-05: 800 mg via ORAL
  Filled 2021-03-05: qty 1

## 2021-03-05 NOTE — ED Notes (Signed)
Pt reports hx of bilateral breast abscess with both breasts have been drained before. Pt reports 4 days of right breast pain, swelling, and discharge. Seen at [facility] on 2/17 for the same complaints. Pt additionally stated that right breast needs drained but there was no surgeon available on the weekend to drain abscess. Sent home with antibiotics and follow up. Pt did not pick up antibiotics. Complains 10/10 right breast pain. Denies fever/chills.

## 2021-03-05 NOTE — Discharge Instructions (Signed)
Please [facility] with the [facility] as we discussed.  It is also necessary for you to start the antibiotics previously sent to you.  I have sent you a couple of pills of Vicodin, please only use this for severe pain.  You may take ibuprofen as well.

## 2021-03-05 NOTE — ED Triage Notes (Signed)
Hx of breast abscess to left and right breasts. Pt states both breasts had to be drained before. Now complains of right breast pain, swelling and discharge x 4 days. Denies fever/chills. Seen at [facility] on 2/17 for the same complaints.

## 2021-03-05 NOTE — ED Provider Notes (Signed)
[facility] EMERGENCY DEPARTMENT Provider Note   CSN: [id number] Arrival date & time: 03/05/21  0747     History  Chief Complaint  Patient presents with   Breast Pain    Mary Davies is a 31 y.o. female with a history of recurrent breast abscesses presenting today with right breast pain.  She reports that for the past year she has had waxing and waning discomfort in this breast for the past year but a week ago she noticed that her breast began to being intensely painful.  At that time, she noted yellow/green discharge with an odor coming from her nipple.  4 days ago, the discharge stopped and her breast became more inflamed and painful.  She is not currently breast-feeding but reports that it is too painful to hold her child.  Denies any fevers or chills.  Has had to have these abscesses drained multiple times in the past.    Home Medications Prior to Admission medications   Medication Sig Start Date End Date Taking? Authorizing Provider  acetaminophen (TYLENOL) 325 MG tablet Take 650 mg by mouth every 6 (six) hours as needed.    [provider]  cephALEXin (KEFLEX) 500 MG capsule Take 1 capsule (500 mg total) by mouth 4 (four) times daily. 03/02/21   Mannie Stabile, PA-C  ibuprofen (ADVIL) 600 MG tablet Take 1 tablet (600 mg total) by mouth every 6 (six) hours. 05/22/20   Sheila Oats, MD  naproxen (NAPROSYN) 500 MG tablet Take 1 tablet (500 mg total) by mouth 2 (two) times daily with a meal. 08/19/20   Wallis Bamberg, PA-C  tiZANidine (ZANAFLEX) 4 MG tablet Take 1 tablet (4 mg total) by mouth every 6 (six) hours as needed for muscle spasms. 08/19/20   Wallis Bamberg, PA-C      Allergies    Pineapple    Review of Systems   Review of Systems  Constitutional:  Negative for chills and fever.  Skin:  Positive for wound.  See HPI  Physical Exam Updated Vital Signs BP 120/79 (BP Location: Right Arm)    Pulse 85    Temp 98.5 F (36.9 C) (Oral)    Resp 15     Ht 5\' 2"  (1.575 m)    Wt 99 kg    SpO2 98%    BMI 39.92 kg/m  Physical Exam Vitals and nursing note reviewed.  Constitutional:      General: She is not in acute distress.    Appearance: Normal appearance. She is not ill-appearing.  HENT:     Head: Normocephalic and atraumatic.  Eyes:     General: No scleral icterus.    Conjunctiva/sclera: Conjunctivae normal.  Pulmonary:     Effort: Pulmonary effort is normal. No respiratory distress.  Chest:  Breasts:    Right: Swelling, skin change and tenderness present. No bleeding or nipple discharge.       Comments: Small palpable nodule to patient's upper nipple.  No nipple discharge.  Large amounts of induration and streaking from the area towards the right axilla. Skin:    Findings: No rash.  Neurological:     Mental Status: She is alert.  Psychiatric:        Mood and Affect: Mood normal.    ED Results / Procedures / Treatments   Labs (all labs ordered are listed, but only abnormal results are displayed) Labs Reviewed - No data to display  EKG None  Radiology No results found.  Procedures . Incision  and Drainage  Date/Time: 03/05/2021 9:17 AM Performed by: Saddie Benders, PA-C Authorized by: Saddie Benders, PA-C   Consent:    Consent obtained:  Verbal   Consent given by:  Patient   Risks, benefits, and alternatives were discussed: yes     Risks discussed:  Incomplete drainage, pain and infection Universal protocol:    Patient identity confirmed:  Verbally with patient Location:    Type:  Abscess (breast abscess)   Size:  1.5x1.5 mm Pre-procedure details:    Skin preparation:  Povidone-iodine and chlorhexidine with alcohol Sedation:    Sedation type:  None Anesthesia:    Anesthesia method:  Local infiltration   Local anesthetic:  Lidocaine 2% w/o epi Procedure type:    Complexity:  Complex Procedure details:    Ultrasound guidance: yes     Needle aspiration: yes     Needle size:  18 G   Incision  types:  Stab incision   Drainage:  Purulent   Drainage amount:  Moderate Post-procedure details:    Procedure completion:  Tolerated   Medications Ordered in ED Medications  lidocaine (XYLOCAINE) 2 % (with pres) injection 200 mg (has no administration in time range)    ED Course/ Medical Decision Making/ A&P                           Medical Decision Making Risk Prescription drug management.  31 year old female presenting with recurrent breast abscesses.  Was seen 3 days ago however did not feel antibiotics.  Pain worsening.  Dr. Anitra Lauth and I assessed the area with ultrasound which revealed a small fluid pocket to the right of the patient's nipple.  I was able to aspirate 2.5 cc of purulent fluid.Marland Kitchen  She will pick up her antibiotics and follow-up with [facility] who she will call today. 6 Vicodin sent to her pharmacy.   Final Clinical Impression(s) / ED Diagnoses Final diagnoses:  Abscess of breast    Rx / DC Orders Results and diagnoses were explained to the patient. Return precautions discussed in full. Patient had no additional questions and expressed complete understanding.   This chart was dictated using voice recognition software.  Despite best efforts to proofread,  errors can occur which can change the documentation meaning.    Woodroe Chen 03/05/21 5027    Gwyneth Sprout, MD 03/05/21 [phone]

## 2021-03-05 NOTE — Telephone Encounter (Signed)
Transition Care Management Unsuccessful Follow-up Telephone Call  Date of discharge and from where:  03/02/2021 from [facility]  Attempts:  1st Attempt  Reason for unsuccessful TCM follow-up call:  Unable to leave message

## 2021-03-06 ENCOUNTER — Telehealth: Payer: Self-pay

## 2021-03-06 DIAGNOSIS — Z9189 Other specified personal risk factors, not elsewhere classified: Secondary | ICD-10-CM

## 2021-03-06 NOTE — Telephone Encounter (Signed)
Transition Care Management Unsuccessful Follow-up Telephone Call  Date of discharge and from where:  03/05/2021-[facility]   Attempts:  1st Attempt  Reason for unsuccessful TCM follow-up call:  Unable to leave message

## 2021-03-06 NOTE — Telephone Encounter (Signed)
Transition Care Management Unsuccessful Follow-up Telephone Call  Date of discharge and from where:  03/02/2021-[facility]  Attempts:  2nd Attempt  Reason for unsuccessful TCM follow-up call:  Unable to leave message

## 2021-03-06 NOTE — Progress Notes (Unsigned)
GYNECOLOGY OFFICE VISIT NOTE  History:   Mary Davies is a 31 y.o. [id number] here today for follow up from the ED for a breast abscess. She has had these before. The abscess had an I&D done on that day.   She delivered in May 2022. She is nursing ***   She denies any abnormal vaginal discharge, bleeding, pelvic pain or other concerns.     Past Medical History:  Diagnosis Date   Asthma    prn inhaler   Closed left radial fracture 01/15/2011   Depression    hx of, fine now   Fracture of metatarsal bone of left foot 04/22/2015   5th metatarsal   Left breast abscess 04/2015    Past Surgical History:  Procedure Laterality Date   CESAREAN SECTION N/A 05/08/2017   Procedure: CESAREAN SECTION;  Surgeon: Tilda Burrow, MD;  Location: [facility] BIRTHING SUIT[facility];  Service: Obstetrics;  Laterality: N/A;   CESAREAN SECTION N/A 05/20/2020   Procedure: CESAREAN SECTION;  Surgeon: Valmont Bing, MD;  Location: [facility] LD ORS;  Service: Obstetrics;  Laterality: N/A;  STAT changed to Urgent   INCISION AND DRAINAGE ABSCESS Left 09/24/2012   Procedure: INCISION AND DRAINAGE BREAST ABSCESS;  Surgeon: Robyne Askew, MD;  Location: [facility] OR;  Service: General;  Laterality: Left;   INCISION AND DRAINAGE ABSCESS Left 05/08/2015   Procedure: INCISION AND DRAINAGE LEFT BREAST  ABSCESS;  Surgeon: Chevis Pretty III, MD;  Location: [facility];  Service: General;  Laterality: Left;    The following portions of the patient's history were reviewed and updated as appropriate: allergies, current medications, past family history, past medical history, past social history, past surgical history and problem list.   Health Maintenance:   Diagnosis  Date Value Ref Range Status  06/20/2020 (A)  Final   - Atypical squamous cells of undetermined significance (ASC-US)    Review of Systems:  Pertinent items noted in HPI and remainder of comprehensive ROS otherwise negative.  Physical Exam:  There were no vitals  taken for this visit. CONSTITUTIONAL: Well-developed, well-nourished female in no acute distress.  HEENT:  Normocephalic, atraumatic. External right and left ear normal. No scleral icterus.  NECK: Normal range of motion, supple, no masses noted on observation SKIN: No rash noted. Not diaphoretic. No erythema. No pallor. MUSCULOSKELETAL: Normal range of motion. No edema noted. NEUROLOGIC: Alert and oriented to person, place, and time. Normal muscle tone coordination. No cranial nerve deficit noted. PSYCHIATRIC: Normal mood and affect. Normal behavior. Normal judgment and thought content. Breasts: {pe breast exam:315056}.  CARDIOVASCULAR: Normal heart rate noted RESPIRATORY: Effort and breath sounds normal, no problems with respiration noted ABDOMEN: No masses noted. No other overt distention noted.    PELVIC: Deferred  Labs and Imaging Results for orders placed or performed during the hospital encounter of 03/02/21 (from the past 168 hour(s))  CBC with Differential   Collection Time: 03/02/21  8:36 PM  Result Value Ref Range   WBC 9.7 4.0 - 10.5 K/uL   RBC 4.56 3.87 - 5.11 MIL/uL   Hemoglobin 15.0 12.0 - 15.0 g/dL   HCT 89.2 11.9 - 41.7 %   MCV 99.1 80.0 - 100.0 fL   MCH 32.9 26.0 - 34.0 pg   MCHC 33.2 30.0 - 36.0 g/dL   RDW 40.8 14.4 - 81.8 %   Platelets 298 150 - 400 K/uL   nRBC 0.0 0.0 - 0.2 %   Neutrophils Relative % 59 %   Neutro  Abs 5.6 1.7 - 7.7 K/uL   Lymphocytes Relative 32 %   Lymphs Abs 3.1 0.7 - 4.0 K/uL   Monocytes Relative 7 %   Monocytes Absolute 0.7 0.1 - 1.0 K/uL   Eosinophils Relative 2 %   Eosinophils Absolute 0.2 0.0 - 0.5 K/uL   Basophils Relative 0 %   Basophils Absolute 0.0 0.0 - 0.1 K/uL   Immature Granulocytes 0 %   Abs Immature Granulocytes 0.03 0.00 - 0.07 K/uL  Basic metabolic panel   Collection Time: 03/02/21  8:36 PM  Result Value Ref Range   Sodium 138 135 - 145 mmol/L   Potassium 4.2 3.5 - 5.1 mmol/L   Chloride 106 98 - 111 mmol/L   CO2  25 22 - 32 mmol/L   Glucose, Bld 81 70 - 99 mg/dL   BUN 22 (H) 6 - 20 mg/dL   Creatinine, Ser 1.61 0.44 - 1.00 mg/dL   Calcium 9.6 8.9 - 09.6 mg/dL   GFR, Estimated >04 >54 mL/min   Anion gap 7 5 - 15   No results found.   Note from the ED visit reviewed from 2/20 regarding the breast abscess.  Assessment and Plan:   1. Breast abscess *** - s/p I&D - Continue Keflex until completed - May continue ibuprofen/tylenol for supportive relief - If persistent, would consider f/u with general surgeon or breast surgeon   Routine preventative health maintenance measures emphasized. She will need f/u for ASCUS pap in June 2023. *** Please refer to After Visit Summary for other counseling recommendations.   No follow-ups on file.  Milas Hock, MD, FACOG Obstetrician & Gynecologist, [facility] for [facility], [facility]

## 2021-03-07 ENCOUNTER — Ambulatory Visit: Payer: BC Managed Care – PPO | Admitting: Obstetrics and Gynecology

## 2021-03-07 ENCOUNTER — Telehealth: Payer: Self-pay

## 2021-03-07 DIAGNOSIS — N611 Abscess of the breast and nipple: Secondary | ICD-10-CM

## 2021-03-07 NOTE — Telephone Encounter (Signed)
Transition Care Management Unsuccessful Follow-up Telephone Call  Date of discharge and from where:  03/02/2021-[facility]  Attempts:  3rd Attempt  Reason for unsuccessful TCM follow-up call:  Unable to leave message

## 2021-03-07 NOTE — Telephone Encounter (Signed)
° °  Telephone encounter was:  Unsuccessful.  03/07/2021 Name: Mary Davies MRN: [id number] DOB: 05-16-1990  Unsuccessful outbound call made today to assist with:   PCP  Outreach Attempt:  1st Attempt  A HIPAA compliant voice message was left requesting a return call.  Instructed patient to call back at earliest convenience. Lenard Forth Care Guide, Embedded Care Coordination [facility], Care Management  [phone] [address] [place], [place], [place] [address] Phone: [phone] Email: [email or web address]

## 2021-03-07 NOTE — Telephone Encounter (Signed)
Transition Care Management Unsuccessful Follow-up Telephone Call  Date of discharge and from where:  03/05/2021-[facility]   Attempts:  2nd Attempt  Reason for unsuccessful TCM follow-up call:  Unable to leave message

## 2021-03-08 ENCOUNTER — Telehealth: Payer: Self-pay

## 2021-03-08 NOTE — Telephone Encounter (Signed)
Transition Care Management Unsuccessful Follow-up Telephone Call  Date of discharge and from where:  03/05/2021 from [facility]  Attempts:  3rd Attempt  Reason for unsuccessful TCM follow-up call:  Unable to leave message

## 2021-03-08 NOTE — Telephone Encounter (Signed)
° °  Telephone encounter was:  Unsuccessful.  03/08/2021 Name: Mary Davies MRN: [id number] DOB: April 10, 1990  Unsuccessful outbound call made today to assist with:   pcp  Outreach Attempt:  2nd Attempt no answer could not leave a message   [facility], Care Management  [phone] [address], [place], [place] [address] Phone: [phone] Email: [email or web address]

## 2021-03-09 ENCOUNTER — Telehealth: Payer: Self-pay

## 2021-03-09 NOTE — Telephone Encounter (Signed)
° °  Telephone encounter was:  Unsuccessful.  03/09/2021 Name: Mary Davies MRN: [id number] DOB: May 31, 1990  Unsuccessful outbound call made today to assist with:   pcp  Outreach Attempt:  3rd Attempt.  Referral closed unable to contact patient. no answer and  voicemail avalible to leave a message    [facility] Guide, Embedded Care Coordination [facility], Care Management  [phone] [address] [place], [place], [place] [address] Phone: [phone] Email: [email or web address]

## 2021-08-15 ENCOUNTER — Telehealth: Payer: Self-pay

## 2021-08-15 NOTE — Telephone Encounter (Signed)
Attempted to contact at requested number about IUD issues, no answer, vm is not set up

## 2022-04-07 ENCOUNTER — Emergency Department (HOSPITAL_COMMUNITY)
Admission: EM | Admit: 2022-04-07 | Discharge: 2022-04-07 | Disposition: A | Discharge status: Home / Self Care | Payer: BC Managed Care – PPO | Attending: Emergency Medicine | Admitting: Emergency Medicine

## 2022-04-07 ENCOUNTER — Encounter (HOSPITAL_COMMUNITY): Payer: Self-pay

## 2022-04-07 ENCOUNTER — Other Ambulatory Visit: Payer: Self-pay

## 2022-04-07 DIAGNOSIS — J45909 Unspecified asthma, uncomplicated: Secondary | ICD-10-CM | POA: Diagnosis not present

## 2022-04-07 DIAGNOSIS — N644 Mastodynia: Secondary | ICD-10-CM | POA: Insufficient documentation

## 2022-04-07 DIAGNOSIS — Z87891 Personal history of nicotine dependence: Secondary | ICD-10-CM | POA: Insufficient documentation

## 2022-04-07 MED ORDER — DOXYCYCLINE HYCLATE 100 MG PO CAPS: 100.0000 mg | ORAL_CAPSULE | Freq: Two times a day (BID) | ORAL | 0 refills | Status: DC

## 2022-04-07 NOTE — ED Triage Notes (Signed)
Reports recurrent right breast infection. Subjective "golf ball" sized lump.   Small pus like drainage. Denies redness.

## 2022-04-07 NOTE — ED Provider Notes (Signed)
[facility] Provider Note  CSN: :[id number] Arrival date & time: 04/07/22 [id number]  Chief Complaint(s) Breast Pain  HPI Mary Davies is a 32 y.o. female {Add pertinent medical, surgical, social history, OB history to HPI:1}   HPI  Past Medical History Past Medical History:  Diagnosis Date   Asthma    prn inhaler   Closed left radial fracture 01/15/2011   Depression    hx of, fine now   Fracture of metatarsal bone of left foot 04/22/2015   5th metatarsal   Left breast abscess 04/2015   Patient Active Problem List   Diagnosis Date Noted   Postpartum care following cesarean delivery 06/20/2020   Encounter for insertion of mirena IUD 06/20/2020   Obesity affecting pregnancy 11/24/2019   Abnormal Pap smear of cervix 11/24/2019   History of gestational diabetes in prior pregnancy, currently pregnant 11/24/2019   Asthma 10/30/2016   Former smoker 07/14/2012   Home Medication(s) Prior to Admission medications   Medication Sig Start Date End Date Taking? Authorizing Provider  acetaminophen (TYLENOL) 325 MG tablet Take 650 mg by mouth every 6 (six) hours as needed.    [provider]  cephALEXin (KEFLEX) 500 MG capsule Take 1 capsule (500 mg total) by mouth 4 (four) times daily. 03/02/21   Suzy Bouchard, PA-C  HYDROcodone-acetaminophen (NORCO/VICODIN) 5-325 MG tablet Take 1 tablet by mouth every 6 (six) hours as needed for severe pain. 03/05/21   Redwine, Madison A, PA-C  ibuprofen (ADVIL) 600 MG tablet Take 1 tablet (600 mg total) by mouth every 6 (six) hours. 03/05/21   Redwine, Madison A, PA-C  naproxen (NAPROSYN) 500 MG tablet Take 1 tablet (500 mg total) by mouth 2 (two) times daily with a meal. 08/19/20   Jaynee Eagles, PA-C  tiZANidine (ZANAFLEX) 4 MG tablet Take 1 tablet (4 mg total) by mouth every 6 (six) hours as needed for muscle spasms. 08/19/20   Jaynee Eagles, PA-C                                                                                                                                     Allergies Pineapple and Pineapple extract  Review of Systems Review of Systems As noted in HPI  Physical Exam Vital Signs  I have reviewed the triage vital signs BP (!) 134/105 (BP Location: Right Arm)   Pulse 79   Temp 98.4 F (36.9 C) (Oral)   Resp 18   Ht 5\' 2"  (1.575 m)   Wt 98.9 kg   SpO2 100%   BMI 39.87 kg/m  *** Physical Exam  ED Results and Treatments Labs (all labs ordered are listed, but only abnormal results are displayed) Labs Reviewed - No data to display  EKG  EKG Interpretation  Date/Time:    Ventricular Rate:    PR Interval:    QRS Duration:   QT Interval:    QTC Calculation:   R Axis:     Text Interpretation:         Radiology No results found.  Medications Ordered in ED Medications - No data to display                                                                                                                                   Procedures Procedures  (including critical care time)  Medical Decision Making / ED Course  Click here for ABCD2, HEART and other calculators  Medical Decision Making         Final Clinical Impression(s) / ED Diagnoses Final diagnoses:  None    {Document critical care time when appropriate:1}  {Document review of labs and clinical decision tools ie heart score, Chads2Vasc2 etc:1}  {Document your independent review of radiology images, and any outside records:1} {Document your discussion with family members, caretakers, and with consultants:1} {Document social determinants of health affecting pt's care:1} {Document your decision making why or why not admission, treatments were needed:1} This chart was dictated using voice recognition software.  Despite best efforts to proofread,  errors can occur which can change  the documentation meaning.

## 2022-04-11 ENCOUNTER — Emergency Department (HOSPITAL_COMMUNITY)
Admission: EM | Admit: 2022-04-11 | Discharge: 2022-04-11 | Disposition: A | Discharge status: Home / Self Care | Payer: BC Managed Care – PPO | Attending: Emergency Medicine | Admitting: Emergency Medicine

## 2022-04-11 ENCOUNTER — Other Ambulatory Visit: Payer: Self-pay

## 2022-04-11 DIAGNOSIS — N611 Abscess of the breast and nipple: Secondary | ICD-10-CM | POA: Diagnosis not present

## 2022-04-11 MED ORDER — ACETAMINOPHEN 500 MG PO TABS
1000.0000 mg | ORAL_TABLET | Freq: Once | ORAL | Status: AC
  Administered 2022-04-11: 1000 mg via ORAL
  Filled 2022-04-11: qty 2

## 2022-04-11 MED ORDER — CLINDAMYCIN HCL 150 MG PO CAPS
450.0000 mg | ORAL_CAPSULE | Freq: Once | ORAL | Status: AC
  Administered 2022-04-11: 450 mg via ORAL
  Filled 2022-04-11: qty 3

## 2022-04-11 MED ORDER — CLINDAMYCIN HCL 150 MG PO CAPS: 450.0000 mg | ORAL_CAPSULE | Freq: Three times a day (TID) | ORAL | 0 refills | Status: DC

## 2022-04-11 NOTE — Discharge Instructions (Addendum)
The breast center is right they do need a PCP for your visit.  The family practice clinic as well as the health and wellness clinic have offered to be a PCP for your breast center visit.  Please call them and let them know that your plan.  I have changed your antibiotics.  Please return for fever rapid spreading redness.  Take 4 over the counter ibuprofen tablets 3 times a day or 2 over-the-counter naproxen tablets twice a day for pain. Also take tylenol 1000mg (2 extra strength) four times a day.

## 2022-04-11 NOTE — ED Provider Notes (Signed)
[facility] Provider Note   CSN: [id number] Arrival date & time: 04/11/22  1848     History  Chief Complaint  Patient presents with   Breast Abscess    Mary Davies is a 32 y.o. female.  32 yo F with a chief complaints of breast abscess.  She is actually seen in the emergency department about 72 hours ago.  Was started on doxycycline.  Since then she has had some drainage but feels like the redness and pain has gotten worse.  No fevers or chills.  She tried to follow-up with the breast center but was told she needed a PCP which she does not have.        Home Medications Prior to Admission medications   Medication Sig Start Date End Date Taking? Authorizing Provider  clindamycin (CLEOCIN) 150 MG capsule Take 3 capsules (450 mg total) by mouth 3 (three) times daily for 7 days. 04/11/22 04/18/22 Yes Deno Etienne, DO  acetaminophen (TYLENOL) 325 MG tablet Take 650 mg by mouth every 6 (six) hours as needed.    [provider]  cephALEXin (KEFLEX) 500 MG capsule Take 1 capsule (500 mg total) by mouth 4 (four) times daily. 03/02/21   Suzy Bouchard, PA-C  HYDROcodone-acetaminophen (NORCO/VICODIN) 5-325 MG tablet Take 1 tablet by mouth every 6 (six) hours as needed for severe pain. 03/05/21   Redwine, Madison A, PA-C  ibuprofen (ADVIL) 600 MG tablet Take 1 tablet (600 mg total) by mouth every 6 (six) hours. 03/05/21   Redwine, Madison A, PA-C  naproxen (NAPROSYN) 500 MG tablet Take 1 tablet (500 mg total) by mouth 2 (two) times daily with a meal. 08/19/20   Jaynee Eagles, PA-C  tiZANidine (ZANAFLEX) 4 MG tablet Take 1 tablet (4 mg total) by mouth every 6 (six) hours as needed for muscle spasms. 08/19/20   Jaynee Eagles, PA-C      Allergies    Pineapple and Pineapple extract    Review of Systems   Review of Systems  Physical Exam Updated Vital Signs BP 117/73 (BP Location: Right Arm)   Pulse 85   Temp 99.1 F (37.3 C)   Resp (!)  22   SpO2 97%  Physical Exam Vitals and nursing note reviewed. Exam conducted with a chaperone present.  Constitutional:      General: She is not in acute distress.    Appearance: She is well-developed. She is not diaphoretic.  HENT:     Head: Normocephalic and atraumatic.  Eyes:     Pupils: Pupils are equal, round, and reactive to light.  Cardiovascular:     Rate and Rhythm: Normal rate and regular rhythm.     Heart sounds: No murmur heard.    No friction rub. No gallop.  Pulmonary:     Effort: Pulmonary effort is normal.     Breath sounds: No wheezing or rales.  Chest:       Comments: There is a small ulceration with inversion of the right nipple with some erythema and warmth laterally.  No obvious fluctuance or induration.  I am unable to express any discharge. Abdominal:     General: There is no distension.     Palpations: Abdomen is soft.     Tenderness: There is no abdominal tenderness.  Musculoskeletal:        General: No tenderness.     Cervical back: Normal range of motion and neck supple.  Skin:    General: Skin  is warm and dry.  Neurological:     Mental Status: She is alert and oriented to person, place, and time.  Psychiatric:        Behavior: Behavior normal.     ED Results / Procedures / Treatments   Labs (all labs ordered are listed, but only abnormal results are displayed) Labs Reviewed - No data to display  EKG None  Radiology No results found.  Procedures Procedures    Medications Ordered in ED Medications  acetaminophen (TYLENOL) tablet 1,000 mg (has no administration in time range)  clindamycin (CLEOCIN) capsule 450 mg (has no administration in time range)    ED Course/ Medical Decision Making/ A&P                             Medical Decision Making Amount and/or Complexity of Data Reviewed Radiology: ordered.  Risk OTC drugs. Prescription drug management.   32 yo F with a chief complaints of a right-sided breast abscess.   She had a similar problem on the left side.  Was seen in the emergency department about 72 hours ago for this abscess on the right.  Was started on doxycycline.  Since then she feels like things is worsened.  No appreciable fevers.  Patient otherwise appears well.  Will have her try and follow-up with the breast center.  Discussed with her about the protocol where the family medicine residency program or the health and wellness center will be her PCP for that visit.  Will change her antibiotics with her worsening.  11:21 PM:  I have discussed the diagnosis/risks/treatment options with the patient.  Evaluation and diagnostic testing in the emergency department does not suggest an emergent condition requiring admission or immediate intervention beyond what has been performed at this time.  They will follow up with Breast center. We also discussed returning to the ED immediately if new or worsening sx occur. We discussed the sx which are most concerning (e.g., sudden worsening pain, fever, inability to tolerate by mouth) that necessitate immediate return. Medications administered to the patient during their visit and any new prescriptions provided to the patient are listed below.  Medications given during this visit Medications  acetaminophen (TYLENOL) tablet 1,000 mg (has no administration in time range)  clindamycin (CLEOCIN) capsule 450 mg (has no administration in time range)     The patient appears reasonably screen and/or stabilized for discharge and I doubt any other medical condition or other [facility] requiring further screening, evaluation, or treatment in the ED at this time prior to discharge.          Final Clinical Impression(s) / ED Diagnoses Final diagnoses:  Breast abscess    Rx / DC Orders ED Discharge Orders          Ordered    US BREAST ASPIRATION RIGHT        04/11/22 2309    clindamycin (CLEOCIN) 150 MG capsule  3 times daily        04/11/22 [place], [place], DO 04/11/22 2321

## 2022-04-11 NOTE — ED Triage Notes (Signed)
Patient reports chronic/recurrent abscess at left breat with drainage , currently taking oral antibiotic with no improvement .

## 2022-04-12 ENCOUNTER — Ambulatory Visit
Admission: EM | Admit: 2022-04-12 | Discharge: 2022-04-12 | Discharge status: Against Medical Advice | Payer: BC Managed Care – PPO | Attending: Family Medicine | Admitting: Family Medicine

## 2022-04-12 ENCOUNTER — Other Ambulatory Visit: Payer: Self-pay | Admitting: Physician Assistant

## 2022-04-12 DIAGNOSIS — N61 Mastitis without abscess: Secondary | ICD-10-CM

## 2022-04-12 NOTE — ED Triage Notes (Signed)
Pt called once in lobby with no answer ° °

## 2022-04-16 ENCOUNTER — Ambulatory Visit: Payer: BC Managed Care – PPO | Admitting: Family Medicine

## 2022-04-22 ENCOUNTER — Ambulatory Visit
Admission: RE | Admit: 2022-04-22 | Discharge: 2022-04-22 | Disposition: A | Discharge status: Home / Self Care | Payer: BC Managed Care – PPO | Source: Ambulatory Visit | Attending: Physician Assistant | Admitting: Physician Assistant

## 2022-04-22 DIAGNOSIS — N61 Mastitis without abscess: Secondary | ICD-10-CM

## 2022-04-23 ENCOUNTER — Other Ambulatory Visit: Payer: Self-pay | Admitting: Physician Assistant

## 2022-04-23 DIAGNOSIS — Z872 Personal history of diseases of the skin and subcutaneous tissue: Secondary | ICD-10-CM

## 2022-04-27 LAB — AEROBIC CULTURE W GRAM STAIN (SUPERFICIAL SPECIMEN)
Culture: NO GROWTH
Special Requests: NORMAL

## 2022-05-08 ENCOUNTER — Ambulatory Visit
Admission: RE | Admit: 2022-05-08 | Discharge: 2022-05-08 | Disposition: A | Discharge status: Home / Self Care | Payer: BC Managed Care – PPO | Source: Ambulatory Visit | Attending: Physician Assistant | Admitting: Physician Assistant

## 2022-05-08 DIAGNOSIS — Z872 Personal history of diseases of the skin and subcutaneous tissue: Secondary | ICD-10-CM

## 2024-01-29 ENCOUNTER — Other Ambulatory Visit: Payer: Self-pay

## 2024-01-29 ENCOUNTER — Encounter (HOSPITAL_COMMUNITY): Payer: Self-pay

## 2024-01-29 ENCOUNTER — Emergency Department (HOSPITAL_COMMUNITY)
Admission: EM | Admit: 2024-01-29 | Discharge: 2024-01-29 | Disposition: A | Discharge status: Home / Self Care | Payer: Self-pay | Attending: Emergency Medicine | Admitting: Emergency Medicine

## 2024-01-29 DIAGNOSIS — Z975 Presence of (intrauterine) contraceptive device: Secondary | ICD-10-CM | POA: Insufficient documentation

## 2024-01-29 DIAGNOSIS — J45909 Unspecified asthma, uncomplicated: Secondary | ICD-10-CM | POA: Insufficient documentation

## 2024-01-29 NOTE — ED Triage Notes (Signed)
 Pt would like her IUD removed and would like to try to conceive. No other complaints at at this time. Axox4.

## 2024-01-29 NOTE — Discharge Instructions (Signed)
 It was a pleasure caring for you today in the emergency department.  Please follow-up with OB/GYN regarding concerns regarding your IUD  Please return to the emergency department for any worsening or worrisome symptoms.

## 2024-01-29 NOTE — ED Provider Notes (Signed)
 " [facility] Provider Note  CSN: [id number] Arrival date & time: 01/29/24 1053  Chief Complaint(s) No chief complaint on file.  HPI Mary Davies is a 34 y.o. female with past medical history as below, significant for asthma, depression who presents to the ED with complaint of requesting IUD removal  Patient reports that she had her IUD placed around 4 years ago, she would like to have the IUD removed because she would like to start trying to conceive.  She does not follow with OB/GYN currently but previously followed with [facility]  Past Medical History Past Medical History:  Diagnosis Date   Asthma    prn inhaler   Closed left radial fracture 01/15/2011   Depression    hx of, fine now   Fracture of metatarsal bone of left foot 04/22/2015   5th metatarsal   Left breast abscess 04/2015   Patient Active Problem List   Diagnosis Date Noted   Radial styloid tenosynovitis of left hand 10/13/2020   Radial styloid tenosynovitis of right hand 10/13/2020   Postpartum care following cesarean delivery 06/20/2020   Encounter for insertion of Mirena  IUD 06/20/2020   Obesity affecting pregnancy 11/24/2019   Abnormal Pap smear of cervix 11/24/2019   History of gestational diabetes in prior pregnancy, currently pregnant 11/24/2019   Asthma 10/30/2016   Former smoker 07/14/2012   Home Medication(s) Prior to Admission medications  Medication Sig Start Date End Date Taking? Authorizing Provider  acetaminophen  (TYLENOL ) 325 MG tablet Take 650 mg by mouth every 6 (six) hours as needed.    [provider]  doxycycline  (VIBRAMYCIN ) 100 MG capsule Take 100 mg by mouth 2 (two) times daily. 04/11/22   [provider]  HYDROcodone -acetaminophen  (NORCO/VICODIN) 5-325 MG tablet Take 1 tablet by mouth every 6 (six) hours as needed for severe pain. 03/05/21   Redwine, Madison A, PA-C  ibuprofen  (ADVIL ) 600 MG tablet Take 1 tablet (600 mg  total) by mouth every 6 (six) hours. 03/05/21   Redwine, Madison A, PA-C  naproxen  (NAPROSYN ) 500 MG tablet Take 1 tablet (500 mg total) by mouth 2 (two) times daily with a meal. 08/19/20   Christopher Savannah, PA-C  tiZANidine  (ZANAFLEX ) 4 MG tablet Take 1 tablet (4 mg total) by mouth every 6 (six) hours as needed for muscle spasms. 08/19/20   Christopher Savannah, PA-C                                                                                                                                    Past Surgical History Past Surgical History:  Procedure Laterality Date   CESAREAN SECTION N/A 05/08/2017   Procedure: CESAREAN SECTION;  Surgeon: Edsel Norleen GAILS, MD;  Location: [facility];  Service: Obstetrics;  Laterality: N/A;   CESAREAN SECTION N/A 05/20/2020   Procedure: CESAREAN SECTION;  Surgeon: Izell Harari, MD;  Location: [facility] LD ORS;  Service: Obstetrics;  Laterality: N/A;  STAT changed to Urgent   INCISION AND DRAINAGE ABSCESS Left 09/24/2012   Procedure: INCISION AND DRAINAGE BREAST ABSCESS;  Surgeon: Deward GORMAN Curvin DOUGLAS, MD;  Location: [facility] OR;  Service: General;  Laterality: Left;   INCISION AND DRAINAGE ABSCESS Left 05/08/2015   Procedure: INCISION AND DRAINAGE LEFT BREAST  ABSCESS;  Surgeon: Deward Curvin III, MD;  Location: [facility];  Service: General;  Laterality: Left;   Family History Family History  Problem Relation Age of Onset   Diabetes Mother    Stroke Mother    Asthma Father     Social History Social History[1] Allergies Pineapple and Pineapple extract  Review of Systems A thorough review of systems was obtained and all systems are negative except as noted in the HPI and PMH.   Physical Exam Vital Signs  I have reviewed the triage vital signs BP 126/87   Pulse 71   Temp 98.2 F (36.8 C) (Oral)   Resp 14   Ht 5' 2 (1.575 m)   Wt 90.7 kg   SpO2 97%   BMI 36.58 kg/m  Physical Exam Vitals and nursing note reviewed.  Constitutional:      General: She is not  in acute distress.    Appearance: Normal appearance. She is well-developed. She is not ill-appearing.  HENT:     Head: Normocephalic and atraumatic.     Right Ear: External ear normal.     Left Ear: External ear normal.     Nose: Nose normal.     Mouth/Throat:     Mouth: Mucous membranes are moist.  Eyes:     General: No scleral icterus.       Right eye: No discharge.        Left eye: No discharge.  Cardiovascular:     Rate and Rhythm: Normal rate.  Pulmonary:     Effort: Pulmonary effort is normal. No respiratory distress.     Breath sounds: No stridor.  Abdominal:     General: Abdomen is flat. There is no distension.     Tenderness: There is no guarding.  Musculoskeletal:        General: No deformity.     Cervical back: No rigidity.  Skin:    General: Skin is warm and dry.     Coloration: Skin is not cyanotic, jaundiced or pale.  Neurological:     Mental Status: She is alert.  Psychiatric:        Speech: Speech normal.        Behavior: Behavior normal. Behavior is cooperative.     ED Results and Treatments Labs (all labs ordered are listed, but only abnormal results are displayed) Labs Reviewed - No data to display                                                                                                                        Radiology No results found.  Pertinent labs &  imaging results that were available during my care of the patient were reviewed by me and considered in my medical decision making (see MDM for details).  Medications Ordered in ED Medications - No data to display                                                                                                                                   Procedures Procedures  (including critical care time)  Medical Decision Making / ED Course    Medical Decision Making:    Mary Davies is a 34 y.o. female with past medical history as below, significant for asthma, depression who presents to  the ED with complaint of requesting IUD removal. The complaint involves an extensive differential diagnosis and also carries with it a high risk of complications and morbidity.  Serious etiology was considered.   Complete initial physical exam performed, notably the patient was in nad.    Reviewed and confirmed nursing documentation for past medical history, family history, social history.  Vital signs reviewed.    Requesting IUD removal > - Patient requesting that I remove her IUD.  I informed patient that this is not an emergent condition and does not necessitate emergent removal.  Will have her follow back up with OB/GYN - She is not having abdominal pelvic pain, no abnormal bleeding, no fevers.  No change in bowel or bladder function  11:34 AM:  I have discussed the diagnosis/risks/treatment options with the patient.  Evaluation and diagnostic testing in the emergency department does not suggest an emergent condition requiring admission or immediate intervention beyond what has been performed at this time.  They will follow up with obgyn. We also discussed returning to the ED immediately if new or worsening sx occur.    The patient appears reasonably screened and/or stabilized for discharge and I doubt any other medical condition or other [facility] requiring further screening, evaluation, or treatment in the ED at this time prior to discharge.                       Additional history obtained: -Additional history obtained from na -External records from outside source obtained and reviewed including: Chart review including previous notes, labs, imaging, consultation notes including  na   Lab Tests: -na  EKG   EKG Interpretation Date/Time:    Ventricular Rate:    PR Interval:    QRS Duration:    QT Interval:    QTC Calculation:   R Axis:      Text Interpretation:           Imaging Studies ordered: na  Medicines ordered and prescription drug management: No  orders of the defined types were placed in this encounter.   -I have reviewed the patients home medicines and have made adjustments as needed   Consultations Obtained: na   Cardiac Monitoring: na   Social Determinants of Health:  Diagnosis or treatment significantly limited by social determinants of health: current smoker   Reevaluation: After the interventions noted above, I reevaluated the patient and found that they have stayed the same  Co morbidities that complicate the patient evaluation  Past Medical History:  Diagnosis Date   Asthma    prn inhaler   Closed left radial fracture 01/15/2011   Depression    hx of, fine now   Fracture of metatarsal bone of left foot 04/22/2015   5th metatarsal   Left breast abscess 04/2015      Dispostion: Disposition decision including need for hospitalization was considered, and patient discharged from emergency department.    Final Clinical Impression(s) / ED Diagnoses Final diagnoses:  IUD (intrauterine device) in place         [1]  Social History Tobacco Use   Smoking status: Every Day    Current packs/day: 0.00    Average packs/day: 0.5 packs/day for 10.0 years (5.0 ttl pk-yrs)    Types: Cigarettes    Start date: 12/13/2009    Last attempt to quit: 12/14/2019    Years since quitting: 4.1   Smokeless tobacco: Never   Tobacco comments:    trying to quit  Vaping Use   Vaping status: Never Used  Substance Use Topics   Alcohol use: No   Drug use: Yes    Types: Marijuana     Elnor Jayson LABOR, DO 01/29/24 1134  "

## 2024-02-27 ENCOUNTER — Encounter: Payer: Self-pay | Admitting: Obstetrics and Gynecology
